# Patient Record
Sex: Female | Born: 1955
Health system: Southern US, Community
[De-identification: ages and names within clinical notes are randomized; demographics above are authoritative.]

## PROBLEM LIST (undated history)

## (undated) DIAGNOSIS — W19XXXA Unspecified fall, initial encounter: Secondary | ICD-10-CM

## (undated) DIAGNOSIS — M5136 Other intervertebral disc degeneration, lumbar region: Secondary | ICD-10-CM

## (undated) DIAGNOSIS — E119 Type 2 diabetes mellitus without complications: Secondary | ICD-10-CM

## (undated) DIAGNOSIS — C50919 Malignant neoplasm of unspecified site of unspecified female breast: Secondary | ICD-10-CM

## (undated) DIAGNOSIS — Z87440 Personal history of urinary (tract) infections: Secondary | ICD-10-CM

## (undated) DIAGNOSIS — C50412 Malignant neoplasm of upper-outer quadrant of left female breast: Secondary | ICD-10-CM

## (undated) DIAGNOSIS — Z86010 Personal history of colonic polyps: Secondary | ICD-10-CM

## (undated) DIAGNOSIS — Z8719 Personal history of other diseases of the digestive system: Secondary | ICD-10-CM

## (undated) DIAGNOSIS — R112 Nausea with vomiting, unspecified: Secondary | ICD-10-CM

## (undated) DIAGNOSIS — Z87442 Personal history of urinary calculi: Secondary | ICD-10-CM

## (undated) DIAGNOSIS — Z87411 Personal history of vaginal dysplasia: Secondary | ICD-10-CM

## (undated) DIAGNOSIS — M51369 Other intervertebral disc degeneration, lumbar region without mention of lumbar back pain or lower extremity pain: Secondary | ICD-10-CM

## (undated) DIAGNOSIS — M722 Plantar fascial fibromatosis: Secondary | ICD-10-CM

## (undated) DIAGNOSIS — Z794 Long term (current) use of insulin: Secondary | ICD-10-CM

## (undated) DIAGNOSIS — Z8639 Personal history of other endocrine, nutritional and metabolic disease: Secondary | ICD-10-CM

## (undated) DIAGNOSIS — E559 Vitamin D deficiency, unspecified: Secondary | ICD-10-CM

## (undated) DIAGNOSIS — J302 Other seasonal allergic rhinitis: Secondary | ICD-10-CM

## (undated) DIAGNOSIS — Z170 Estrogen receptor positive status [ER+]: Secondary | ICD-10-CM

## (undated) DIAGNOSIS — N200 Calculus of kidney: Secondary | ICD-10-CM

## (undated) DIAGNOSIS — D497 Neoplasm of unspecified behavior of endocrine glands and other parts of nervous system: Secondary | ICD-10-CM

## (undated) DIAGNOSIS — Z8709 Personal history of other diseases of the respiratory system: Secondary | ICD-10-CM

## (undated) DIAGNOSIS — C801 Malignant (primary) neoplasm, unspecified: Secondary | ICD-10-CM

## (undated) DIAGNOSIS — Z9889 Other specified postprocedural states: Secondary | ICD-10-CM

## (undated) DIAGNOSIS — R87610 Atypical squamous cells of undetermined significance on cytologic smear of cervix (ASC-US): Secondary | ICD-10-CM

## (undated) DIAGNOSIS — E213 Hyperparathyroidism, unspecified: Secondary | ICD-10-CM

## (undated) DIAGNOSIS — B379 Candidiasis, unspecified: Secondary | ICD-10-CM

## (undated) DIAGNOSIS — Z860101 Personal history of adenomatous and serrated colon polyps: Secondary | ICD-10-CM

## (undated) DIAGNOSIS — S0993XA Unspecified injury of face, initial encounter: Secondary | ICD-10-CM

## (undated) DIAGNOSIS — K5792 Diverticulitis of intestine, part unspecified, without perforation or abscess without bleeding: Secondary | ICD-10-CM

## (undated) DIAGNOSIS — K219 Gastro-esophageal reflux disease without esophagitis: Secondary | ICD-10-CM

## (undated) DIAGNOSIS — Z923 Personal history of irradiation: Secondary | ICD-10-CM

## (undated) DIAGNOSIS — E042 Nontoxic multinodular goiter: Secondary | ICD-10-CM

## (undated) DIAGNOSIS — N890 Mild vaginal dysplasia: Secondary | ICD-10-CM

## (undated) HISTORY — PX: COLON SURGERY: SHX602

## (undated) HISTORY — PX: DILATION AND CURETTAGE OF UTERUS: SHX78

## (undated) HISTORY — DX: Atypical squamous cells of undetermined significance on cytologic smear of cervix (ASC-US): R87.610

## (undated) HISTORY — DX: Neoplasm of unspecified behavior of endocrine glands and other parts of nervous system: D49.7

## (undated) HISTORY — DX: Diverticulitis of intestine, part unspecified, without perforation or abscess without bleeding: K57.92

## (undated) HISTORY — PX: RIGHT COLECTOMY: SHX853

## (undated) HISTORY — DX: Gastro-esophageal reflux disease without esophagitis: K21.9

## (undated) HISTORY — PX: APPENDECTOMY: SHX54

## (undated) HISTORY — DX: Vitamin D deficiency, unspecified: E55.9

## (undated) HISTORY — PX: LAPAROSCOPIC ASSISTED VAGINAL HYSTERECTOMY: SHX5398

## (undated) HISTORY — PX: LAPAROSCOPIC CHOLECYSTECTOMY: SUR755

## (undated) HISTORY — PX: HYSTEROSCOPY WITH RESECTOSCOPE: SHX5395

## (undated) HISTORY — DX: Plantar fascial fibromatosis: M72.2

## (undated) HISTORY — PX: ABDOMINAL HYSTERECTOMY: SHX81

## (undated) HISTORY — DX: Other seasonal allergic rhinitis: J30.2

## (undated) HISTORY — DX: Other intervertebral disc degeneration, lumbar region without mention of lumbar back pain or lower extremity pain: M51.369

## (undated) HISTORY — PX: CARPAL TUNNEL RELEASE: SHX101

## (undated) HISTORY — DX: Mild vaginal dysplasia: N89.0

## (undated) HISTORY — PX: TUBAL LIGATION: SHX77

## (undated) HISTORY — PX: OTHER SURGICAL HISTORY: SHX169

## (undated) HISTORY — DX: Personal history of colonic polyps: Z86.010

## (undated) HISTORY — DX: Other intervertebral disc degeneration, lumbar region: M51.36

## (undated) HISTORY — PX: CHOLECYSTECTOMY: SHX55

## (undated) HISTORY — DX: Calculus of kidney: N20.0

---
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
## 1998-06-15 ENCOUNTER — Other Ambulatory Visit: Admission: RE | Admit: 1998-06-15 | Discharge: 1998-06-15 | Payer: Self-pay | Admitting: Obstetrics and Gynecology

## 1998-08-06 ENCOUNTER — Other Ambulatory Visit: Admission: RE | Admit: 1998-08-06 | Discharge: 1998-08-06 | Payer: Self-pay | Admitting: Obstetrics and Gynecology

## 1999-06-02 ENCOUNTER — Encounter: Admission: RE | Admit: 1999-06-02 | Discharge: 1999-06-02 | Payer: Self-pay | Admitting: Obstetrics and Gynecology

## 1999-06-02 ENCOUNTER — Encounter: Payer: Self-pay | Admitting: Obstetrics and Gynecology

## 2000-06-02 ENCOUNTER — Encounter: Admission: RE | Admit: 2000-06-02 | Discharge: 2000-06-02 | Payer: Self-pay | Admitting: Obstetrics and Gynecology

## 2000-06-02 ENCOUNTER — Encounter: Payer: Self-pay | Admitting: Obstetrics and Gynecology

## 2000-07-05 ENCOUNTER — Other Ambulatory Visit: Admission: RE | Admit: 2000-07-05 | Discharge: 2000-07-05 | Payer: Self-pay | Admitting: Obstetrics and Gynecology

## 2000-07-07 ENCOUNTER — Encounter: Admission: RE | Admit: 2000-07-07 | Discharge: 2000-07-07 | Payer: Self-pay | Admitting: Obstetrics and Gynecology

## 2000-07-07 ENCOUNTER — Encounter: Payer: Self-pay | Admitting: Obstetrics and Gynecology

## 2000-07-25 ENCOUNTER — Other Ambulatory Visit: Admission: RE | Admit: 2000-07-25 | Discharge: 2000-07-25 | Payer: Self-pay | Admitting: Obstetrics and Gynecology

## 2001-01-11 HISTORY — PX: HYSTEROSCOPY: SHX211

## 2001-05-12 ENCOUNTER — Encounter: Payer: Self-pay | Admitting: Obstetrics and Gynecology

## 2001-05-12 ENCOUNTER — Encounter: Admission: RE | Admit: 2001-05-12 | Discharge: 2001-05-12 | Payer: Self-pay | Admitting: Obstetrics and Gynecology

## 2001-06-01 ENCOUNTER — Emergency Department (HOSPITAL_COMMUNITY): Admission: EM | Admit: 2001-06-01 | Discharge: 2001-06-01 | Payer: Self-pay | Admitting: Emergency Medicine

## 2001-06-01 ENCOUNTER — Encounter: Payer: Self-pay | Admitting: Emergency Medicine

## 2001-08-22 ENCOUNTER — Other Ambulatory Visit: Admission: RE | Admit: 2001-08-22 | Discharge: 2001-08-22 | Payer: Self-pay | Admitting: Obstetrics and Gynecology

## 2001-08-24 ENCOUNTER — Ambulatory Visit (HOSPITAL_COMMUNITY): Admission: RE | Admit: 2001-08-24 | Discharge: 2001-08-24 | Payer: Self-pay | Admitting: Obstetrics and Gynecology

## 2001-08-24 ENCOUNTER — Encounter (INDEPENDENT_AMBULATORY_CARE_PROVIDER_SITE_OTHER): Payer: Self-pay | Admitting: *Deleted

## 2002-01-11 HISTORY — PX: OTHER SURGICAL HISTORY: SHX169

## 2002-05-16 ENCOUNTER — Observation Stay (HOSPITAL_COMMUNITY): Admission: RE | Admit: 2002-05-16 | Discharge: 2002-05-17 | Payer: Self-pay | Admitting: Obstetrics and Gynecology

## 2002-05-16 ENCOUNTER — Encounter (INDEPENDENT_AMBULATORY_CARE_PROVIDER_SITE_OTHER): Payer: Self-pay | Admitting: *Deleted

## 2002-05-30 ENCOUNTER — Encounter: Payer: Self-pay | Admitting: Obstetrics and Gynecology

## 2002-05-30 ENCOUNTER — Encounter: Admission: RE | Admit: 2002-05-30 | Discharge: 2002-05-30 | Payer: Self-pay | Admitting: Obstetrics and Gynecology

## 2002-07-28 ENCOUNTER — Ambulatory Visit (HOSPITAL_COMMUNITY): Admission: RE | Admit: 2002-07-28 | Discharge: 2002-07-28 | Payer: Self-pay | Admitting: Neurology

## 2003-07-16 ENCOUNTER — Ambulatory Visit (HOSPITAL_BASED_OUTPATIENT_CLINIC_OR_DEPARTMENT_OTHER): Admission: RE | Admit: 2003-07-16 | Discharge: 2003-07-16 | Payer: Self-pay | Admitting: Orthopedic Surgery

## 2003-08-12 ENCOUNTER — Ambulatory Visit (HOSPITAL_BASED_OUTPATIENT_CLINIC_OR_DEPARTMENT_OTHER): Admission: RE | Admit: 2003-08-12 | Discharge: 2003-08-12 | Payer: Self-pay | Admitting: Orthopedic Surgery

## 2003-11-15 ENCOUNTER — Encounter: Admission: RE | Admit: 2003-11-15 | Discharge: 2003-12-30 | Payer: Self-pay | Admitting: Orthopedic Surgery

## 2004-08-26 ENCOUNTER — Ambulatory Visit (HOSPITAL_COMMUNITY): Admission: RE | Admit: 2004-08-26 | Discharge: 2004-08-26 | Payer: Self-pay | Admitting: Surgery

## 2004-09-18 ENCOUNTER — Encounter: Admission: RE | Admit: 2004-09-18 | Discharge: 2004-09-18 | Payer: Self-pay | Admitting: Surgery

## 2004-12-29 ENCOUNTER — Emergency Department (HOSPITAL_COMMUNITY): Admission: EM | Admit: 2004-12-29 | Discharge: 2004-12-30 | Payer: Self-pay | Admitting: Emergency Medicine

## 2005-02-12 ENCOUNTER — Ambulatory Visit (HOSPITAL_COMMUNITY): Admission: RE | Admit: 2005-02-12 | Discharge: 2005-02-12 | Payer: Self-pay | Admitting: Surgery

## 2005-07-16 ENCOUNTER — Ambulatory Visit (HOSPITAL_COMMUNITY): Admission: RE | Admit: 2005-07-16 | Discharge: 2005-07-16 | Payer: Self-pay | Admitting: *Deleted

## 2005-09-21 ENCOUNTER — Ambulatory Visit (HOSPITAL_COMMUNITY): Admission: RE | Admit: 2005-09-21 | Discharge: 2005-09-21 | Payer: Self-pay | Admitting: Surgery

## 2005-09-21 ENCOUNTER — Encounter (INDEPENDENT_AMBULATORY_CARE_PROVIDER_SITE_OTHER): Payer: Self-pay | Admitting: *Deleted

## 2007-03-29 ENCOUNTER — Ambulatory Visit (HOSPITAL_COMMUNITY): Admission: RE | Admit: 2007-03-29 | Discharge: 2007-03-29 | Payer: Self-pay | Admitting: *Deleted

## 2007-10-16 ENCOUNTER — Encounter: Admission: RE | Admit: 2007-10-16 | Discharge: 2008-01-14 | Payer: Self-pay | Admitting: *Deleted

## 2008-01-08 ENCOUNTER — Ambulatory Visit (HOSPITAL_COMMUNITY): Admission: RE | Admit: 2008-01-08 | Discharge: 2008-01-08 | Payer: Self-pay | Admitting: Surgery

## 2008-01-16 ENCOUNTER — Encounter: Admission: RE | Admit: 2008-01-16 | Discharge: 2008-01-16 | Payer: Self-pay | Admitting: Surgery

## 2008-04-04 ENCOUNTER — Emergency Department (HOSPITAL_COMMUNITY): Admission: EM | Admit: 2008-04-04 | Discharge: 2008-04-04 | Payer: Self-pay | Admitting: Family Medicine

## 2008-04-18 ENCOUNTER — Encounter: Admission: RE | Admit: 2008-04-18 | Discharge: 2008-04-18 | Payer: Self-pay | Admitting: Internal Medicine

## 2008-04-23 ENCOUNTER — Ambulatory Visit: Payer: Self-pay | Admitting: Family Medicine

## 2008-07-23 ENCOUNTER — Emergency Department (HOSPITAL_COMMUNITY): Admission: EM | Admit: 2008-07-23 | Discharge: 2008-07-23 | Payer: Self-pay | Admitting: Family Medicine

## 2008-12-16 ENCOUNTER — Ambulatory Visit (HOSPITAL_COMMUNITY): Admission: RE | Admit: 2008-12-16 | Discharge: 2008-12-16 | Payer: Self-pay | Admitting: Gastroenterology

## 2009-01-08 ENCOUNTER — Encounter: Admission: RE | Admit: 2009-01-08 | Discharge: 2009-01-08 | Payer: Self-pay | Admitting: *Deleted

## 2009-01-15 ENCOUNTER — Encounter: Admission: RE | Admit: 2009-01-15 | Discharge: 2009-01-15 | Payer: Self-pay | Admitting: *Deleted

## 2009-03-14 ENCOUNTER — Ambulatory Visit (HOSPITAL_COMMUNITY): Admission: RE | Admit: 2009-03-14 | Discharge: 2009-03-14 | Payer: Self-pay | Admitting: Gastroenterology

## 2009-03-31 ENCOUNTER — Encounter: Admission: RE | Admit: 2009-03-31 | Discharge: 2009-06-29 | Payer: Self-pay | Admitting: *Deleted

## 2009-07-01 ENCOUNTER — Encounter (INDEPENDENT_AMBULATORY_CARE_PROVIDER_SITE_OTHER): Payer: Self-pay | Admitting: Surgery

## 2009-07-01 ENCOUNTER — Inpatient Hospital Stay (HOSPITAL_COMMUNITY): Admission: RE | Admit: 2009-07-01 | Discharge: 2009-07-08 | Payer: Self-pay | Admitting: Surgery

## 2010-02-01 ENCOUNTER — Encounter: Payer: Self-pay | Admitting: Surgery

## 2010-02-10 NOTE — Assessment & Plan Note (Signed)
Summary: WGT MGT CLASS/[facility]

## 2010-03-29 LAB — DIFFERENTIAL
Basophils Absolute: 0.1 10*3/uL (ref 0.0–0.1)
Basophils Relative: 1 % (ref 0–1)
Eosinophils Absolute: 0.1 10*3/uL (ref 0.0–0.7)
Eosinophils Relative: 2 % (ref 0–5)
Lymphocytes Relative: 35 % (ref 12–46)
Lymphs Abs: 3.2 10*3/uL (ref 0.7–4.0)
Monocytes Absolute: 0.4 10*3/uL (ref 0.1–1.0)
Monocytes Relative: 5 % (ref 3–12)
Neutro Abs: 5.2 10*3/uL (ref 1.7–7.7)
Neutrophils Relative %: 58 % (ref 43–77)

## 2010-03-29 LAB — GLUCOSE, CAPILLARY
Glucose-Capillary: 116 mg/dL — ABNORMAL HIGH (ref 70–99)
Glucose-Capillary: 127 mg/dL — ABNORMAL HIGH (ref 70–99)
Glucose-Capillary: 128 mg/dL — ABNORMAL HIGH (ref 70–99)
Glucose-Capillary: 130 mg/dL — ABNORMAL HIGH (ref 70–99)
Glucose-Capillary: 136 mg/dL — ABNORMAL HIGH (ref 70–99)
Glucose-Capillary: 139 mg/dL — ABNORMAL HIGH (ref 70–99)
Glucose-Capillary: 141 mg/dL — ABNORMAL HIGH (ref 70–99)
Glucose-Capillary: 142 mg/dL — ABNORMAL HIGH (ref 70–99)
Glucose-Capillary: 143 mg/dL — ABNORMAL HIGH (ref 70–99)
Glucose-Capillary: 144 mg/dL — ABNORMAL HIGH (ref 70–99)
Glucose-Capillary: 144 mg/dL — ABNORMAL HIGH (ref 70–99)
Glucose-Capillary: 148 mg/dL — ABNORMAL HIGH (ref 70–99)
Glucose-Capillary: 148 mg/dL — ABNORMAL HIGH (ref 70–99)
Glucose-Capillary: 154 mg/dL — ABNORMAL HIGH (ref 70–99)
Glucose-Capillary: 154 mg/dL — ABNORMAL HIGH (ref 70–99)
Glucose-Capillary: 154 mg/dL — ABNORMAL HIGH (ref 70–99)
Glucose-Capillary: 155 mg/dL — ABNORMAL HIGH (ref 70–99)
Glucose-Capillary: 158 mg/dL — ABNORMAL HIGH (ref 70–99)
Glucose-Capillary: 167 mg/dL — ABNORMAL HIGH (ref 70–99)
Glucose-Capillary: 168 mg/dL — ABNORMAL HIGH (ref 70–99)
Glucose-Capillary: 171 mg/dL — ABNORMAL HIGH (ref 70–99)
Glucose-Capillary: 177 mg/dL — ABNORMAL HIGH (ref 70–99)
Glucose-Capillary: 180 mg/dL — ABNORMAL HIGH (ref 70–99)
Glucose-Capillary: 188 mg/dL — ABNORMAL HIGH (ref 70–99)
Glucose-Capillary: 191 mg/dL — ABNORMAL HIGH (ref 70–99)

## 2010-03-29 LAB — URINALYSIS, ROUTINE W REFLEX MICROSCOPIC
Bilirubin Urine: NEGATIVE
Glucose, UA: NEGATIVE mg/dL
Glucose, UA: NEGATIVE mg/dL
Hgb urine dipstick: NEGATIVE
Ketones, ur: NEGATIVE mg/dL
Ketones, ur: NEGATIVE mg/dL
Leukocytes, UA: NEGATIVE
Nitrite: NEGATIVE
Nitrite: POSITIVE — AB
Protein, ur: NEGATIVE mg/dL
Protein, ur: NEGATIVE mg/dL
Specific Gravity, Urine: 1.017 (ref 1.005–1.030)
Specific Gravity, Urine: 1.025 (ref 1.005–1.030)
Urobilinogen, UA: 0.2 mg/dL (ref 0.0–1.0)
Urobilinogen, UA: 1 mg/dL (ref 0.0–1.0)
pH: 5.5 (ref 5.0–8.0)
pH: 7 (ref 5.0–8.0)

## 2010-03-29 LAB — BASIC METABOLIC PANEL
BUN: 3 mg/dL — ABNORMAL LOW (ref 6–23)
BUN: 5 mg/dL — ABNORMAL LOW (ref 6–23)
CO2: 27 mEq/L (ref 19–32)
CO2: 29 mEq/L (ref 19–32)
Calcium: 10.5 mg/dL (ref 8.4–10.5)
Calcium: 9.5 mg/dL (ref 8.4–10.5)
Chloride: 101 mEq/L (ref 96–112)
Chloride: 96 mEq/L (ref 96–112)
Creatinine, Ser: 0.74 mg/dL (ref 0.4–1.2)
Creatinine, Ser: 0.78 mg/dL (ref 0.4–1.2)
GFR calc Af Amer: >60 mL/min (ref >60)
GFR calc Af Amer: >60 mL/min (ref >60)
GFR calc non Af Amer: >60 mL/min (ref >60)
GFR calc non Af Amer: >60 mL/min (ref >60)
Glucose, Bld: 139 mg/dL — ABNORMAL HIGH (ref 70–99)
Glucose, Bld: 160 mg/dL — ABNORMAL HIGH (ref 70–99)
Potassium: 4.1 mEq/L (ref 3.5–5.1)
Potassium: 4.4 mEq/L (ref 3.5–5.1)
Sodium: 133 mEq/L — ABNORMAL LOW (ref 135–145)
Sodium: 135 mEq/L (ref 135–145)

## 2010-03-29 LAB — COMPREHENSIVE METABOLIC PANEL
ALT: 60 U/L — ABNORMAL HIGH (ref 0–35)
AST: 48 U/L — ABNORMAL HIGH (ref 0–37)
Albumin: 4.1 g/dL (ref 3.5–5.2)
Alkaline Phosphatase: 115 U/L (ref 39–117)
BUN: 8 mg/dL (ref 6–23)
CO2: 27 mEq/L (ref 19–32)
Calcium: 10.7 mg/dL — ABNORMAL HIGH (ref 8.4–10.5)
Chloride: 104 mEq/L (ref 96–112)
Creatinine, Ser: 0.72 mg/dL (ref 0.4–1.2)
GFR calc Af Amer: >60 mL/min (ref >60)
GFR calc non Af Amer: >60 mL/min (ref >60)
Glucose, Bld: 104 mg/dL — ABNORMAL HIGH (ref 70–99)
Potassium: 4.4 mEq/L (ref 3.5–5.1)
Sodium: 136 mEq/L (ref 135–145)
Total Bilirubin: 0.9 mg/dL (ref 0.3–1.2)
Total Protein: 7 g/dL (ref 6.0–8.3)

## 2010-03-29 LAB — CBC
HCT: 38.8 % (ref 36.0–46.0)
HCT: 41.2 % (ref 36.0–46.0)
HCT: 42.7 % (ref 36.0–46.0)
Hemoglobin: 13.3 g/dL (ref 12.0–15.0)
Hemoglobin: 14.2 g/dL (ref 12.0–15.0)
Hemoglobin: 14.3 g/dL (ref 12.0–15.0)
MCH: 30.2 pg (ref 26.0–34.0)
MCHC: 33.5 g/dL (ref 30.0–36.0)
MCHC: 34.2 g/dL (ref 30.0–36.0)
MCHC: 34.5 g/dL (ref 30.0–36.0)
MCV: 89 fL (ref 78.0–100.0)
MCV: 90 fL (ref 78.0–100.0)
MCV: 90.2 fL (ref 78.0–100.0)
Platelets: 225 10*3/uL (ref 150–400)
Platelets: 248 10*3/uL (ref 150–400)
Platelets: 311 10*3/uL (ref 150–400)
RBC: 4.31 MIL/uL (ref 3.87–5.11)
RBC: 4.63 MIL/uL (ref 3.87–5.11)
RBC: 4.75 MIL/uL (ref 3.87–5.11)
RDW: 14.4 % (ref 11.5–15.5)
RDW: 14.5 % (ref 11.5–15.5)
RDW: 14.9 % (ref 11.5–15.5)
WBC: 10 10*3/uL (ref 4.0–10.5)
WBC: 11.2 10*3/uL — ABNORMAL HIGH (ref 4.0–10.5)
WBC: 9 10*3/uL (ref 4.0–10.5)

## 2010-03-29 LAB — URINE MICROSCOPIC-ADD ON

## 2010-03-29 LAB — URINE CULTURE: Colony Count: 60000

## 2010-03-29 LAB — HEPATIC FUNCTION PANEL
ALT: 56 U/L — ABNORMAL HIGH (ref 0–35)
AST: 35 U/L (ref 0–37)
Albumin: 4 g/dL (ref 3.5–5.2)
Alkaline Phosphatase: 116 U/L (ref 39–117)
Bilirubin, Direct: 0.2 mg/dL (ref 0.0–0.3)
Indirect Bilirubin: 1 mg/dL — ABNORMAL HIGH (ref 0.3–0.9)
Total Bilirubin: 1.2 mg/dL (ref 0.3–1.2)
Total Protein: 6.7 g/dL (ref 6.0–8.3)

## 2010-03-29 LAB — PROTIME-INR
INR: 0.95 (ref 0.00–1.49)
Prothrombin Time: 12.6 seconds (ref 11.6–15.2)

## 2010-04-06 LAB — GLUCOSE, CAPILLARY: Glucose-Capillary: 150 mg/dL — ABNORMAL HIGH (ref 70–99)

## 2010-04-19 LAB — POCT URINALYSIS DIP (DEVICE)
Bilirubin Urine: NEGATIVE
Glucose, UA: NEGATIVE mg/dL
Ketones, ur: NEGATIVE mg/dL
Nitrite: NEGATIVE
Protein, ur: NEGATIVE mg/dL
Specific Gravity, Urine: 1.025 (ref 1.005–1.030)
Urobilinogen, UA: 0.2 mg/dL (ref 0.0–1.0)
pH: 6 (ref 5.0–8.0)

## 2010-04-19 LAB — URINE CULTURE: Colony Count: >=100000

## 2010-04-23 LAB — POCT URINALYSIS DIP (DEVICE)
Bilirubin Urine: NEGATIVE
Glucose, UA: NEGATIVE mg/dL
Hgb urine dipstick: NEGATIVE
Ketones, ur: NEGATIVE mg/dL
Nitrite: NEGATIVE
Protein, ur: NEGATIVE mg/dL
Specific Gravity, Urine: 1.025 (ref 1.005–1.030)
Urobilinogen, UA: 0.2 mg/dL (ref 0.0–1.0)
pH: 6 (ref 5.0–8.0)

## 2010-05-29 NOTE — Op Note (Signed)
[name], [name]          ACCOUNT NO.:  [id number]   MEDICAL RECORD NO.:  [id number]          PATIENT TYPE:  OIB   LOCATION:  5727                         FACILITY:  [facility]   PHYSICIAN:  Velora Heckler, MD      DATE OF BIRTH:  1955/06/28   DATE OF PROCEDURE:  09/21/2005  DATE OF DISCHARGE:                                 OPERATIVE REPORT   PREOPERATIVE DIAGNOSIS:  Symptomatic cholelithiasis.   POSTOPERATIVE DIAGNOSIS:  Symptomatic cholelithiasis.   PROCEDURE:  Laparoscopic cholecystectomy.   SURGEON:  Velora Heckler, MD, FACS   ASSISTANT:  Gita Kudo, M.D., FACS   ANESTHESIA:  General.   ESTIMATED BLOOD LOSS:  Minimal.   PREPARATION:  Betadine.   COMPLICATIONS:  None.   INDICATIONS:  The patient is a 55 year old white female employee of [employer] who presents with several month history of right upper  quadrant abdominal pain.  The patient has noted fatty food intolerance.  Pain occasionally radiates to the back.  An ultrasound performed at [facility] documented gallstones and fatty liver.  The patient now comes to  surgery for treatment of symptomatic cholelithiasis.   DESCRIPTION OF PROCEDURE:  Procedure is done in OR #16 at the [facility].  The patient is brought to the operating room, placed in  supine position on the operating room table.  Following administration of  general anesthesia, the patient is prepped and draped in the usual strict  aseptic fashion.  After ascertaining that an adequate level of anesthesia  had been obtained, a infraumbilical incision is made through the old scar at  that site.  Dissection is carried through subcutaneous tissues to the  fascia.  Fascia is incised in the midline.  The peritoneal cavity is entered  cautiously.  A 0 Vicryl pursestring suture is placed in the fascia.  An  Hasson cannula is introduced and secured with a pursestring suture.  Abdomen  is insufflated with  carbon dioxide.  Laparoscope is introduced and the  abdomen explored.  Operative ports are placed along the right costal margin  in the midline, mid clavicular line and anterior axillary line.  Fundus of  the gallbladder is grasped and retracted cephalad.  There is significant  adhesions of the omentum to the undersurface of the gallbladder.  These are  taken down with gentle blunt dissection.  Dissection is carried down to the  neck of the gallbladder.  Cystic duct is dissected out.  It is quite small.  The stones are moderately large.  Decision is made not to proceed with  cholangiography and the cystic duct is triply clipped and divided.  Cystic  artery and its posterior branches are also divided between Ligaclips.  Gallbladder is excised from the gallbladder bed using the hook  electrocautery for hemostasis.  Gallbladder is completely excised and placed  into an EndoCatch bag.  It is withdrawn through the umbilical port.  It  contains numerous large gallstones.  The 0 Vicryl pursestring suture is tied  securely.  Right upper quadrant is irrigated copiously with warm saline  which is  evacuated.  Good hemostasis is noted in the gallbladder bed.  Pneumoperitoneum is released.  Ports are removed under direct vision.  Good  hemostasis is noted at all port sites.  Port sites are anesthetized with  local anesthetic.  All wounds are closed with interrupted 4-0 Vicryl  subcuticular sutures.  Wounds are washed and dried and Benzoin and Steri-  Strips are applied.  Sterile dressings are applied.  The patient is awakened  from anesthesia and brought to the recovery room in stable condition.  The  patient tolerated the procedure well.      Velora Heckler, MD  Electronically Signed     TMG/MEDQ  D:  09/21/2005  T:  09/21/2005  Job:  [id number]   cc:   Samuel Jester

## 2010-05-29 NOTE — Op Note (Signed)
NAME:  Alexis [name], Alexis [name]                    ACCOUNT NO.:  [id number]   MEDICAL RECORD NO.:  [id number]                   PATIENT TYPE:  AMB   LOCATION:  DSC                                  FACILITY:  [facility]   PHYSICIAN:  Robert A. Thurston Hole, M.D.              DATE OF BIRTH:  07/14/1955   DATE OF PROCEDURE:  08/12/2003  DATE OF DISCHARGE:                                 OPERATIVE REPORT   PREOPERATIVE DIAGNOSIS:  Left carpal tunnel syndrome.   POSTOPERATIVE DIAGNOSIS:  Left carpal tunnel syndrome.   PROCEDURE:  Left carpal tunnel release.   SURGEON:  Elana Alm. Thurston Hole, M.D.   ASSISTANT:  Julien Girt, P.A.   ANESTHESIA:  General anesthesia.   OPERATIVE TIME:  15 minutes.   COMPLICATIONS:  None.   INDICATIONS FOR PROCEDURE:  [name] is a 55 year old woman who has had  significant left carpal tunnel syndrome increasing in nature with examine  and nerve conduction studies documenting this.  She has failed conservative  care and is now to undergo release.   DESCRIPTION OF PROCEDURE:  [name] is brought to the operating room on  August 12, 2003, placed on the operating table in supine position.  After an  adequate level of general anesthesia was obtained, her left hand and arm  were prepped using sterile DuraPrep and draped using sterile technique.  Arm  was exsanguinated and a tourniquet elevated 275 mmHg.  Initially through a 4  cm palmar incision, initial exposure was made.  The underlying subcutaneous  tissues were incised along with skin incision.  The transverse carpal  ligament was exposed at the level of the wrist flexion crease and carefully  incised.  Hemostat was used to protect the median nerve while the entire  transverse carpal ligament was released distally to the level of the  superficial palmar arch, carefully protecting this and released proximally  approximately three inches proximal to the wrist flexion crease, carefully  protecting the palmar  cutaneous branch of the median nerve.  The median  nerve itself was found to be significantly flattened and compressed but no  other pathology was noted.  After this was done, it was felt a complete  satisfactory release had been achieved.  The wound was then irrigated and  closed with interrupted 4-0 nylon mattress sutures and injected with 0.25%  Marcaine. Sterile dressing was applied.  Tourniquet was released and the  patient was awakened and taken to the recovery room in stable condition.   FOLLOW UP CARE:  [name] will be followed as an outpatient on Vicodin  and Naprosyn.  See her back in the office in a week for wound check and  follow-up.  Robert A. Thurston Hole, M.D.   RAW/MEDQ  D:  08/12/2003  T:  08/12/2003  Job:  [id number]

## 2010-05-29 NOTE — Op Note (Signed)
NAME:  Alexis [name], Alexis [name]                       ACCOUNT NO.:  [id number]   MEDICAL RECORD NO.:  [id number]                   PATIENT TYPE:  OBV   LOCATION:  9306                                 FACILITY:  [facility]   PHYSICIAN:  Sherry A. Rosalio Macadamia, M.D.           DATE OF BIRTH:  11-29-1955   DATE OF PROCEDURE:  05/16/2002  DATE OF DISCHARGE:  05/17/2002                                 OPERATIVE REPORT   PREOPERATIVE DIAGNOSES:  1. Menorrhagia.  2. Fibroid uterus.   POSTOPERATIVE DIAGNOSES:  1. Menorrhagia.  2. Fibroid uterus.   PROCEDURE:  Laparoscopically-assisted vaginal hysterectomy.   SURGEONS:  Sherry A. Rosalio Macadamia, M.D., Genia Del, M.D.   ANESTHESIA:  General.   INDICATIONS:  This is a 55 year old gravida 2, para 2-0-0-2, woman who has  had a known fibroid uterus for many years.  The patient has had a long  history of menorrhagia.  She was initially treated in August 2003 with a D&C  hysteroscopy with resectoscope to remove an endometrial polyp.  Endometrial  polyp was benign.  The patient initially had improvement in her cycles;  however, now the bleeding has become excessively heavy once again.  Repeat  ultrasound revealed enlarged uterus with several fibroids but no thickened  endometrium at this time.  Because of this the patient is brought to the  operating room for LAVH.   FINDINGS:  A 10 to 11-week size uterus, normal fallopian tubes and ovaries,  normal appendix and upper abdomen.   PROCEDURE:  The patient was brought into the operating room and given  adequate general anesthesia.  She was placed in a dorsal lithotomy position.  Her abdomen and vagina were washed with Hibiclens.  A Foley catheter was  placed in the bladder.  A speculum was placed within the vagina.  The cervix  was grasped with a single-toothed Hulka tenaculum.  The speculum was  removed.  The surgeon's gown and gloves were changed.  The subumbilical area  was infiltrated with 0.25%  Marcaine.  Incision was made.  This was brought  down to the fascia.  The fascia was grasped with Kocher clamps and incised.  The fascial edges were grasped and sutured in a pursestring-type stitch with  0 Vicryl.  The peritoneum was identified and entered bluntly.  The Hasson  trocar was introduced into the peritoneal cavity.  The laparoscope was  introduced and carbon dioxide was used for insufflation.  Using 0.25%  Marcaine, lateral incisions were made and 5 mm trocars placed on each side  under direct visualization.  The pelvis was inspected with the above  findings, and it was felt that an LAVH could be performed.  Using tripolar  cautery, the right round ligament was cauterized x3 and cut in the middle.  This technique was used to interrupted the utero-ovarian ligaments.  This  was brought down to just above the uterine artery.  The right and left  ureters had  been identified prior to any surgery.  The same procedure was  performed on the left.  The bladder peritoneum was identified and incised.  Nezhat was used for hydro-insufflation.  The bladder peritoneum was then  incised bilaterally.  Small amount of cautery was continued close to the  uterus.  Then the vaginal portion of the procedure was performed.   A weighted speculum was placed within the vagina, a Jacobson tenaculum was  placed on the cervix, an the Hulka tenaculum was removed.  The cervix was  infiltrated with 1% Xylocaine with epinephrine circumferentially.  Incision  was made circumferentially.  The posterior cul-de-sac was entered sharply.  This incision was extended.  The uterosacral ligaments were clamped, cut,  and suture ligated with 0 Vicryl LigaSure.  The vaginal cuff was then closed  with 0 Vicryl in a running locked stitch.  A long-nosed weighted speculum  was placed within the posterior cul-de-sac.  The vaginal mucosa was  developed off of the cervix with blunt and sharp dissection.  The anterior  cul-de-sac  was then entered and retractor placed within the space.  On  alternating sides, LigaSure was used for cautery x2 or 3 prior to cutting.  This was performed up to the area of abdominal cautery at the utero-ovarian  ligament.  Once all vascular pedicles had been cauterized and cut, the  cervix and uterus were removed from the vagina.  Small bleeders were  identified along the cardinal ligaments, and these were closed with 0 Vicryl  figure-of-eight stitches and small areas were cauterized.  McCall plication  stitches were taken across the posterior cul-de-sac to try to prevent future  enterocele development.  The anterior peritoneum was identified and the  vagina was closed in a vertical fashion using 0 Vicryl figure-of-eight  stitches.  The uterosacral ligaments were tied in the midline.  Adequate  hemostasis was present.  Surgeons' gloves were changed and the laparoscope  was reintroduced into the abdominal cavity and reinsufflated.  The pelvis  was inspected with no significant bleeding present.  The pelvis was  irrigated.  The upper abdomen was not visualized.  The appendix was  visualized and felt to be normal.  All irrigation was removed and all carbon  dioxide was removed.  The left lateral sleeve was removed under direct  visualization.  The Hasson was removed under direct visualization, and the  right sleeve was removed after all carbon dioxide had escaped.  The  subumbilical incision was closed after carefully assuring no tissues coming  through the incision, and this was closed with the 0 Vicryl pursestring that  was in place.  The subumbilical incision was then closed using 4-0 Monocryl  in a subcuticular running stitch.  All three incisions were then closed with  Dermabond.  The patient was taken out of the dorsal lithotomy position.  She  was awakened.  She was moved from the operating table to a stretcher in  stable condition.  COMPLICATIONS:  None.   ESTIMATED BLOOD LOSS:   200 mL.   URINE OUTPUT:  350 mL.                                                Sherry A. Rosalio Macadamia, M.D.    SAD/MEDQ  D:  07/01/2002  T:  07/02/2002  Job:  [id number]

## 2010-05-29 NOTE — Op Note (Signed)
NAME:  Alexis [name], Alexis [name]                       ACCOUNT NO.:  [id number]   MEDICAL RECORD NO.:  [id number]                   PATIENT TYPE:  AMB   LOCATION:  SDC                                  FACILITY:  [facility]   PHYSICIAN:  Sherry A. Rosalio Macadamia, M.D.           DATE OF BIRTH:  23-Mar-1955   DATE OF PROCEDURE:  08/24/2001  DATE OF DISCHARGE:                                 OPERATIVE REPORT   PREOPERATIVE DIAGNOSES:  1. Irregular bleeding.  2. Fibroid uterus.  3. Thickened endometrium.   POSTOPERATIVE DIAGNOSES:  1. Irregular bleeding.  2. Fibroid uterus.  3. Thickened endometrium.  4. Endometrial polyps.   PROCEDURE:  Dilatation and curettage, hysteroscopy with resectoscope.   SURGEON:  Sherry A. Rosalio Macadamia, M.D.   ANESTHESIA:  MAC converted to general.   INDICATIONS:  This is a 55 year old woman who has had very irregular and  heavy periods.  Some periods have been every two to three weeks.  Besides  having irregular cycles, she is also having bleeding between cycles and  menses lasting longer than normal.  Because of this, the patient has had an  ultrasound which revealed several fibroids. She also had thickened  endometrium with probably polyp present.  Because of this, the patient is  brought to the operating room for [facility] hysteroscopy.   FINDINGS:  Ten week size anteflexed uterus with endometrial polyp.  No  adnexal mass.   DESCRIPTION OF PROCEDURE:  The patient is brought to the operating room and  given adequate IV sedation.  She was placed in a dorsal lithotomy position.  Her perineum was washed with Hibiclens.  Pelvic examination was performed.  The patient was draped in a sterile fashion.  Speculum was placed in the  vagina.  The vagina was washed with Hibiclens.  A paracervical block was  administered with 1% Nesacaine.  The cervix was sounded.  The cervix was  dilated with Pratt dilators to a #31.  The hysteroscope was introduced into  the endometrial cavity.   During this procedure, the patient was moving  significantly, and it was felt that she should receive general anesthesia at  this time.  This was done, and she tolerated the remaining portion of the  surgery.  Pictures were obtained.  Using double loop right angle resector,  the polypoid structure at the internal os was removed first, then polypoid  structure in the endometrial cavity was removed.  Sampling of the  endometrial cavity was then obtained circumferentially with superficial  sheets of endometrial tissue removed.  Bleeders were cauterized.  Pictures  were  obtained.  Adequate hemostasis was present.  All instruments were removed  from the vagina.  The patient was taken out of the dorsal lithotomy  position.  She was awakened.  She was moved from the operating room to the  stretcher in stable condition.  Complications were none.  Estimated blood  loss was less than 5 cc.  Sherry A. Rosalio Macadamia, M.D.    SAD/MEDQ  D:  08/24/2001  T:  08/25/2001  Job:  [id number]

## 2010-05-29 NOTE — Op Note (Signed)
NAME:  Alexis [name], Alexis [name]                    ACCOUNT NO.:  [id number]   MEDICAL RECORD NO.:  [id number]                   PATIENT TYPE:  AMB   LOCATION:  DSC                                  FACILITY:  [facility]   PHYSICIAN:  Robert A. Thurston Hole, M.D.              DATE OF BIRTH:  04/10/1955   DATE OF PROCEDURE:  DATE OF DISCHARGE:                                 OPERATIVE REPORT   DATE OF OPERATION:  July 16, 2003.   PREOPERATIVE DIAGNOSIS:  Right carpal tunnel syndrome.   POSTOPERATIVE DIAGNOSIS:  Right carpal tunnel syndrome.   PROCEDURE:  Right carpal tunnel release.   SURGEON:  Elana Alm. Thurston Hole, MD   ASSISTANT:  Julien Girt, PA.   ANESTHESIA:  General.   OPERATIVE TIME:  20 minutes.   COMPLICATIONS:  None.   INDICATIONS FOR PROCEDURE:  [name] is a 55 year old woman who has had  painful, recurrent right carpal tunnel syndrome documented by EMG-PNCV  evaluation, that has failed conservative and is now to undergo carpal tunnel  release.   DESCRIPTION OF PROCEDURE:  [name] was brought to the operating room  on July 16, 2003, placed on the operating room table in the supine position.  After an adequate level of general anesthesia was obtained, her right hand  and arm were prepped using sterile Duraprep and draped using a sterile  technique.  The arm was exsanguinated and a tourniquet elevated to 275 mm.  Initially through a 4 cm palmar incision, initial exposure was made.  The  underlying subcutaneous tissues were incised in line with the skin incision.  The transverse carpal ligament was exposed at the level of the wrist flexion  crease and carefully incised revealing the underlying median nerve which was  carefully protected with a hemostat while the entire transverse carpal  ligament was released distally to the level of the superficial palmar arch,  carefully protecting this and released proximally approximately 3 inches  proximal to the wrist flexion  crease, carefully protecting the palmar  cutaneous branch of the median nerve.  The median nerve itself was found to  be significantly flattened and compressed, but no other pathology was noted.  A full and complete release was achieved.  The wound was then irrigated and  closed with interrupted 4-0 nylon suture and injected with 0.25% Marcaine.  Sterile dressings were applied, the tourniquet was released, and the patient  was awakened and taken to the recovery room in stable condition.   FOLLOWUP CARE:  [name] is to be followed as an outpatient on Vicodin  and Naprosyn.  See her back in the office in a week for wound check and  followup.  Robert A. Thurston Hole, M.D.    RAW/MEDQ  D:  07/16/2003  T:  07/16/2003  Job:  [id number]

## 2010-12-07 ENCOUNTER — Other Ambulatory Visit (HOSPITAL_COMMUNITY): Payer: Self-pay | Admitting: *Deleted

## 2010-12-07 DIAGNOSIS — Z1231 Encounter for screening mammogram for malignant neoplasm of breast: Secondary | ICD-10-CM

## 2011-01-14 ENCOUNTER — Ambulatory Visit (HOSPITAL_COMMUNITY)
Admission: RE | Admit: 2011-01-14 | Discharge: 2011-01-14 | Disposition: A | Discharge status: Home / Self Care | Payer: 59 | Source: Ambulatory Visit | Attending: *Deleted | Admitting: *Deleted

## 2011-01-14 DIAGNOSIS — Z1231 Encounter for screening mammogram for malignant neoplasm of breast: Secondary | ICD-10-CM

## 2011-05-17 ENCOUNTER — Encounter: Payer: 59 | Admitting: Dietician

## 2011-06-16 ENCOUNTER — Encounter: Payer: Self-pay | Admitting: *Deleted

## 2011-06-16 ENCOUNTER — Encounter: Payer: 59 | Attending: *Deleted | Admitting: *Deleted

## 2011-06-16 DIAGNOSIS — E119 Type 2 diabetes mellitus without complications: Secondary | ICD-10-CM | POA: Insufficient documentation

## 2011-06-16 DIAGNOSIS — Z713 Dietary counseling and surveillance: Secondary | ICD-10-CM | POA: Insufficient documentation

## 2011-06-16 NOTE — Progress Notes (Signed)
  Medical Nutrition Therapy:  Appt start time: 1700 end time:  1830.  Assessment:  Primary concerns today: patient here for assistance with diabetes education and weight loss. She is here with her close friend and co-worker who also has diabetes and is interested in weight loss. She works 40 hours a week and lives with her daughter, 2 grandchildren and her daughter's boyfriend. She prepares the meals for the family. She has attended Diabetes Education Classes in the past.  MEDICATIONS: see list. Diabetes medication is Metformin   DIETARY INTAKE:  Usual eating pattern includes 3 meals and 0-2 snacks per day.  Everyday foods include good variety of most food groups.  Avoided foods include milk and diet soda.    24-hr recall:  B ( AM): eggs, bacon at work, OR cereal and milk OR breakfast sandwich  Snk ( AM): occasionally if hungry Nabs OR Nutrigrain Bar OR fresh fruit  L ( PM): brings from home; left overs, water OR small regular soda Snk ( PM): very occasionally same as AM D ( PM): meat, starch, vegetables OR left overs Snk ( PM): none Beverages: water, small amount of regular soda occasionally,  Usual physical activity: clean house, mow yard, walk occasionally, play with grandchildren  Estimated energy needs: 1200 calories 135 g carbohydrates 90 g protein 33 g fat  Progress Towards Goal(s):  In progress.   Nutritional Diagnosis:  NI-1.5 Excessive energy intake As related to limited activity level.  As evidenced by BMI of 34%.    Intervention:  Nutrition counseling and diabetes education review provided. Reviewed physiology of diabetes, action of Metformin, Carb Counting and importance of increasing her activity level to increase her metabolism. Plan: Plan for 15 minutes of activity every day Aim for 2-3 Carb Choices (30-45 grams) per meal, 0-1 per snack if hungry Continue to check BG as directed by your MD Consider using Menu Planner to practice meal planning Remember Calorie  Brooke Dare App for phone Plan to discuss Meal Plan ideas at next visit and Glycemic Index  Handouts given during visit include: Living Well with Diabetes Carb Counting and Food Label handouts Meal Plan Card Menu Planner  Get Fit While You Sit Exercise Book handout  Monitoring/Evaluation:  Dietary intake, exercise, and body weight prn.

## 2011-06-16 NOTE — Patient Instructions (Signed)
Plan: Plan for 15 minutes of activity every day Aim for 2-3 Carb Choices (30-45 grams) per meal, 0-1 per snack if hungry Continue to check BG as directed by your MD Consider using Menu Planner to practice meal planning Remember Calorie Brooke Dare App for phone Plan to discuss Meal Plan ideas at next visit and Glycemic Index

## 2011-11-29 ENCOUNTER — Emergency Department (HOSPITAL_COMMUNITY)
Admission: EM | Admit: 2011-11-29 | Discharge: 2011-11-29 | Disposition: A | Discharge status: Home / Self Care | Payer: 59 | Attending: Emergency Medicine | Admitting: Emergency Medicine

## 2011-11-29 ENCOUNTER — Encounter (HOSPITAL_COMMUNITY): Payer: Self-pay | Admitting: *Deleted

## 2011-11-29 ENCOUNTER — Emergency Department (HOSPITAL_COMMUNITY): Payer: 59

## 2011-11-29 DIAGNOSIS — K529 Noninfective gastroenteritis and colitis, unspecified: Secondary | ICD-10-CM

## 2011-11-29 DIAGNOSIS — K5289 Other specified noninfective gastroenteritis and colitis: Secondary | ICD-10-CM | POA: Insufficient documentation

## 2011-11-29 DIAGNOSIS — R197 Diarrhea, unspecified: Secondary | ICD-10-CM | POA: Insufficient documentation

## 2011-11-29 DIAGNOSIS — E119 Type 2 diabetes mellitus without complications: Secondary | ICD-10-CM | POA: Insufficient documentation

## 2011-11-29 DIAGNOSIS — Z79899 Other long term (current) drug therapy: Secondary | ICD-10-CM | POA: Insufficient documentation

## 2011-11-29 DIAGNOSIS — Z7982 Long term (current) use of aspirin: Secondary | ICD-10-CM | POA: Insufficient documentation

## 2011-11-29 HISTORY — DX: Type 2 diabetes mellitus without complications: E11.9

## 2011-11-29 LAB — CBC WITH DIFFERENTIAL/PLATELET
Basophils Absolute: 0 10*3/uL (ref 0.0–0.1)
Basophils Relative: 0 % (ref 0–1)
Eosinophils Absolute: 0.2 10*3/uL (ref 0.0–0.7)
Eosinophils Relative: 3 % (ref 0–5)
HCT: 44.3 % (ref 36.0–46.0)
Hemoglobin: 14.8 g/dL (ref 12.0–15.0)
Lymphocytes Relative: 32 % (ref 12–46)
Lymphs Abs: 2.7 10*3/uL (ref 0.7–4.0)
MCH: 29.3 pg (ref 26.0–34.0)
MCHC: 33.4 g/dL (ref 30.0–36.0)
MCV: 87.7 fL (ref 78.0–100.0)
Monocytes Absolute: 0.4 10*3/uL (ref 0.1–1.0)
Monocytes Relative: 5 % (ref 3–12)
Neutro Abs: 5 10*3/uL (ref 1.7–7.7)
Neutrophils Relative %: 60 % (ref 43–77)
Platelets: 233 10*3/uL (ref 150–400)
RBC: 5.05 MIL/uL (ref 3.87–5.11)
RDW: 13.9 % (ref 11.5–15.5)
WBC: 8.3 10*3/uL (ref 4.0–10.5)

## 2011-11-29 LAB — COMPREHENSIVE METABOLIC PANEL
ALT: 41 U/L — ABNORMAL HIGH (ref 0–35)
AST: 26 U/L (ref 0–37)
Albumin: 4.3 g/dL (ref 3.5–5.2)
Alkaline Phosphatase: 113 U/L (ref 39–117)
BUN: 9 mg/dL (ref 6–23)
CO2: 24 mEq/L (ref 19–32)
Calcium: 11.3 mg/dL — ABNORMAL HIGH (ref 8.4–10.5)
Chloride: 107 mEq/L (ref 96–112)
Creatinine, Ser: 0.63 mg/dL (ref 0.50–1.10)
GFR calc Af Amer: >90 mL/min (ref >90)
GFR calc non Af Amer: >90 mL/min (ref >90)
Glucose, Bld: 119 mg/dL — ABNORMAL HIGH (ref 70–99)
Potassium: 3.8 mEq/L (ref 3.5–5.1)
Sodium: 142 mEq/L (ref 135–145)
Total Bilirubin: 0.7 mg/dL (ref 0.3–1.2)
Total Protein: 7.4 g/dL (ref 6.0–8.3)

## 2011-11-29 LAB — LIPASE, BLOOD: Lipase: 20 U/L (ref 11–59)

## 2011-11-29 MED ORDER — MORPHINE SULFATE 2 MG/ML IJ SOLN
INTRAMUSCULAR | Status: AC
  Administered 2011-11-29: 2 mg via INTRAVENOUS
  Filled 2011-11-29: qty 1

## 2011-11-29 MED ORDER — KETOROLAC TROMETHAMINE 30 MG/ML IJ SOLN
30.0000 mg | Freq: Once | INTRAMUSCULAR | Status: AC
  Administered 2011-11-29: 30 mg via INTRAVENOUS

## 2011-11-29 MED ORDER — IOHEXOL 300 MG/ML  SOLN
100.0000 mL | Freq: Once | INTRAMUSCULAR | Status: AC | PRN
  Administered 2011-11-29: 100 mL via INTRAVENOUS

## 2011-11-29 MED ORDER — ONDANSETRON HCL 4 MG PO TABS: 4.0000 mg | ORAL_TABLET | Freq: Four times a day (QID) | ORAL | Status: DC

## 2011-11-29 MED ORDER — MORPHINE SULFATE 2 MG/ML IJ SOLN
2.0000 mg | Freq: Once | INTRAMUSCULAR | Status: AC
  Administered 2011-11-29: 2 mg via INTRAVENOUS

## 2011-11-29 MED ORDER — KETOROLAC TROMETHAMINE 30 MG/ML IJ SOLN
INTRAMUSCULAR | Status: AC
  Administered 2011-11-29: 30 mg via INTRAVENOUS
  Filled 2011-11-29: qty 1

## 2011-11-29 MED ORDER — SODIUM CHLORIDE 0.9 % IV BOLUS (SEPSIS)
1000.0000 mL | Freq: Once | INTRAVENOUS | Status: AC
  Administered 2011-11-29: 1000 mL via INTRAVENOUS

## 2011-11-29 MED ORDER — ONDANSETRON HCL 4 MG/2ML IJ SOLN
4.0000 mg | Freq: Once | INTRAMUSCULAR | Status: AC
  Administered 2011-11-29: 4 mg via INTRAVENOUS
  Filled 2011-11-29: qty 2

## 2011-11-29 MED ORDER — DICYCLOMINE HCL 20 MG PO TABS: ORAL_TABLET | ORAL | Status: DC

## 2011-11-29 NOTE — ED Notes (Signed)
Pt with N/V/D since last Tuesday, c/o bloating of abdomen, lost 5 lbs this week per pt.

## 2011-11-29 NOTE — ED Notes (Signed)
Patient with no complaints at this time. Respirations even and unlabored. Skin warm/dry. Discharge instructions reviewed with patient at this time. Patient given opportunity to voice concerns/ask questions. IV removed per policy and band-aid applied to site. Patient discharged at this time and left Emergency Department with steady gait.  

## 2011-11-29 NOTE — ED Notes (Signed)
Family at bedside. Patient does not need anything at this time. 

## 2011-11-29 NOTE — ED Provider Notes (Signed)
History    This chart was scribed for Alexis Lennert, MD, MD by Smitty Pluck, ED Scribe. The patient was seen in room [facility]06 and the patient's care was started at 3:26PM.   CSN: [id number]  Arrival date & time 11/29/11  1424     Chief Complaint  Patient presents with  . Emesis  . Diarrhea  . Nausea    (Consider location/radiation/quality/duration/timing/severity/associated sxs/prior treatment) Patient is a 56 y.o. female presenting with vomiting and diarrhea. The history is provided by the patient. No language interpreter was used.  Emesis  This is a new problem. The current episode started yesterday. The problem occurs 2 to 4 times per day. The problem has been resolved. The emesis has an appearance of stomach contents. There has been no fever. Pertinent negatives include no abdominal pain, no cough, no diarrhea and no headaches.  Diarrhea The primary symptoms include vomiting. Primary symptoms do not include fatigue, abdominal pain, diarrhea or rash. The illness began 6 to 7 days ago. The onset was sudden.  The illness does not include back pain.   Alexis [name] is a 56 y.o. female who presents to the Emergency Department complaining of intermittent, moderate diarrhea onset 6 days ago. Describes the diarrhea as watery. Pt reports that she has had 12 inches of colon removed, gallbladder removed and hysterectomy. She reports vomiting 4x 1 day ago. Denies abdominal pain, rectal bleeding, blood in stool and any other pain.   Past Medical History  Diagnosis Date  . Diabetes mellitus without complication     Past Surgical History  Procedure Date  . Colon surgery   . Cholecystectomy   . Abdominal hysterectomy   . Carpal tunnel release     No family history on file.  History  Substance Use Topics  . Smoking status: Never Smoker   . Smokeless tobacco: Never Used  . Alcohol Use: No    OB History    Grav Para Term Preterm Abortions TAB SAB Ect Mult Living        Review of Systems  Constitutional: Negative for fatigue.  HENT: Negative for congestion, sinus pressure and ear discharge.   Eyes: Negative for discharge.  Respiratory: Negative for cough.   Cardiovascular: Negative for chest pain.  Gastrointestinal: Positive for vomiting. Negative for abdominal pain and diarrhea.  Genitourinary: Negative for frequency and hematuria.  Musculoskeletal: Negative for back pain.  Skin: Negative for rash.  Neurological: Negative for seizures and headaches.  Hematological: Negative.   Psychiatric/Behavioral: Negative for hallucinations.  All other systems reviewed and are negative.    Allergies  Parlodel  Home Medications   Current Outpatient Rx  Name  Route  Sig  Dispense  Refill  . ASPIRIN 81 MG PO TABS   Oral   Take 81 mg by mouth daily.         Marland Kitchen ESOMEPRAZOLE MAGNESIUM 40 MG PO CPDR   Oral   Take 40 mg by mouth daily before breakfast.         . LEVOTHYROXINE SODIUM 25 MCG PO TABS   Oral   Take 25 mcg by mouth daily.         Marland Kitchen METFORMIN HCL 500 MG PO TABS   Oral   Take 500 mg by mouth 2 (two) times daily with a meal.         . PHENTERMINE HCL 37.5 MG PO CAPS   Oral   Take 37.5 mg by mouth every morning.         Marland Kitchen  RAMIPRIL 5 MG PO CAPS   Oral   Take 5 mg by mouth daily.         Marland Kitchen VITAMIN D (ERGOCALCIFEROL) 50000 UNITS PO CAPS   Oral   Take 50,000 Units by mouth.           BP 148/73  Pulse 89  Temp 98.3 F (36.8 C) (Oral)  Resp 20  Ht 5' 4.5" (1.638 m)  Wt 195 lb (88.451 kg)  BMI 32.95 kg/m2  SpO2 96%  Physical Exam  Nursing note and vitals reviewed. Constitutional: She is oriented to person, place, and time. She appears well-developed.  HENT:  Head: Normocephalic and atraumatic.  Eyes: Conjunctivae normal and EOM are normal. No scleral icterus.  Neck: Neck supple. No thyromegaly present.  Cardiovascular: Normal rate and regular rhythm.  Exam reveals no gallop and no friction rub.   No murmur  heard. Pulmonary/Chest: No stridor. She has no wheezes. She has no rales. She exhibits no tenderness.  Abdominal: She exhibits distension (mild). There is tenderness (mildly diffuse). There is no rebound.  Musculoskeletal: Normal range of motion. She exhibits no edema.  Lymphadenopathy:    She has no cervical adenopathy.  Neurological: She is oriented to person, place, and time. Coordination normal.  Skin: No rash noted. No erythema.  Psychiatric: She has a normal mood and affect. Her behavior is normal.    ED Course  Procedures (including critical care time) DIAGNOSTIC STUDIES: Oxygen Saturation is 96% on room air, adequate by my interpretation.    COORDINATION OF CARE: 3:31 PM Discussed ED treatment with pt  4:30 PM Ordered:    . [COMPLETED] morphine  2 mg Intravenous Once  . [COMPLETED] ondansetron  4 mg Intravenous Once       Labs Reviewed  COMPREHENSIVE METABOLIC PANEL - Abnormal; Notable for the following:    Glucose, Bld 119 (*)     Calcium 11.3 (*)     ALT 41 (*)     All other components within normal limits  CBC WITH DIFFERENTIAL  LIPASE, BLOOD   Ct Abdomen Pelvis W Contrast  11/29/2011  *RADIOLOGY REPORT*  Clinical Data: Abdominal pain, previous cholecystectomy  CT ABDOMEN AND PELVIS WITH CONTRAST  Technique:  Multidetector CT imaging of the abdomen and pelvis was performed following the standard protocol during bolus administration of intravenous contrast.  Contrast: OMNIPAQUE IOHEXOL 300 MG/ML  SOLN  Comparison: None.  Findings: Visualized lung bases clear.  Vascular clips in the gallbladder fossa.  Unremarkable liver, spleen, adrenal glands, pancreas.  Small low attenuation renal lesions bilaterally probably cysts, incompletely characterized.  No hydronephrosis.  Stomach is physiologically distended.  The small bowel is nondilated.  Staple line in the ascending colon.  Distal colon is decompressed. Urinary bladder incompletely distended.  Previous  hysterectomy.  No ascites.  No free air.  Portal vein patent.  No adenopathy.  Lumbar spine unremarkable.  IMPRESSION: 1.  No acute abdominal process. 2.  Postop changes as above.   Original Report Authenticated By: D. Andria Rhein, MD    Dg Abd Acute W/chest  11/29/2011  *RADIOLOGY REPORT*  Clinical Data: Vomiting.  ACUTE ABDOMEN SERIES (ABDOMEN 2 VIEW & CHEST 1 VIEW)  Comparison: 07/05/2009.  Findings: The upright chest x-ray demonstrates stable left-sided pleural thickening at the costophrenic sulcus.  No acute cardiopulmonary findings.  Two views of the abdomen demonstrates scattered air in the small bowel and colon with scattered air fluid levels.  Findings most likely due to a diffuse ileus.  Early or partial small bowel obstruction is possible also.  No free air.  The soft tissue shadows are maintained.  The bony structures are intact.  IMPRESSION:  1.  Ileus versus partial or early small bowel obstruction. Recommend correlation with bowel sounds. 2.  No acute cardiopulmonary findings.   Original Report Authenticated By: Rudie Meyer, M.D.      No diagnosis found.    MDM        The chart was scribed for me under my direct supervision.  I personally performed the history, physical, and medical decision making and all procedures in the evaluation of this patient.Alexis Lennert, MD 11/29/11 2250

## 2011-11-29 NOTE — ED Notes (Signed)
Family at bedside. Patient has headache RN made aware.

## 2012-01-14 ENCOUNTER — Emergency Department (INDEPENDENT_AMBULATORY_CARE_PROVIDER_SITE_OTHER): Payer: 59

## 2012-01-14 ENCOUNTER — Emergency Department (HOSPITAL_COMMUNITY)
Admission: EM | Admit: 2012-01-14 | Discharge: 2012-01-14 | Disposition: A | Discharge status: Home / Self Care | Payer: 59 | Attending: Emergency Medicine | Admitting: Emergency Medicine

## 2012-01-14 ENCOUNTER — Emergency Department (HOSPITAL_COMMUNITY)
Admission: EM | Admit: 2012-01-14 | Discharge: 2012-01-14 | Disposition: A | Discharge status: Home / Self Care | Payer: 59 | Source: Home / Self Care | Attending: Emergency Medicine | Admitting: Emergency Medicine

## 2012-01-14 ENCOUNTER — Encounter (HOSPITAL_COMMUNITY): Payer: Self-pay

## 2012-01-14 DIAGNOSIS — I10 Essential (primary) hypertension: Secondary | ICD-10-CM | POA: Insufficient documentation

## 2012-01-14 DIAGNOSIS — R109 Unspecified abdominal pain: Secondary | ICD-10-CM

## 2012-01-14 DIAGNOSIS — R110 Nausea: Secondary | ICD-10-CM | POA: Insufficient documentation

## 2012-01-14 DIAGNOSIS — K529 Noninfective gastroenteritis and colitis, unspecified: Secondary | ICD-10-CM

## 2012-01-14 DIAGNOSIS — Z7982 Long term (current) use of aspirin: Secondary | ICD-10-CM | POA: Insufficient documentation

## 2012-01-14 DIAGNOSIS — R1084 Generalized abdominal pain: Secondary | ICD-10-CM | POA: Insufficient documentation

## 2012-01-14 DIAGNOSIS — Z79899 Other long term (current) drug therapy: Secondary | ICD-10-CM | POA: Insufficient documentation

## 2012-01-14 DIAGNOSIS — E119 Type 2 diabetes mellitus without complications: Secondary | ICD-10-CM | POA: Insufficient documentation

## 2012-01-14 DIAGNOSIS — K5289 Other specified noninfective gastroenteritis and colitis: Secondary | ICD-10-CM | POA: Insufficient documentation

## 2012-01-14 LAB — CBC WITH DIFFERENTIAL/PLATELET
Basophils Absolute: 0 10*3/uL (ref 0.0–0.1)
Basophils Relative: 0 % (ref 0–1)
Eosinophils Absolute: 0.3 10*3/uL (ref 0.0–0.7)
Eosinophils Relative: 4 % (ref 0–5)
HCT: 46 % (ref 36.0–46.0)
Hemoglobin: 15.2 g/dL — ABNORMAL HIGH (ref 12.0–15.0)
Lymphocytes Relative: 30 % (ref 12–46)
Lymphs Abs: 2.5 10*3/uL (ref 0.7–4.0)
MCH: 28.8 pg (ref 26.0–34.0)
MCHC: 33 g/dL (ref 30.0–36.0)
MCV: 87.1 fL (ref 78.0–100.0)
Monocytes Absolute: 0.5 10*3/uL (ref 0.1–1.0)
Monocytes Relative: 6 % (ref 3–12)
Neutro Abs: 5.1 10*3/uL (ref 1.7–7.7)
Neutrophils Relative %: 61 % (ref 43–77)
Platelets: 220 10*3/uL (ref 150–400)
RBC: 5.28 MIL/uL — ABNORMAL HIGH (ref 3.87–5.11)
RDW: 14.2 % (ref 11.5–15.5)
WBC: 8.4 10*3/uL (ref 4.0–10.5)

## 2012-01-14 LAB — COMPREHENSIVE METABOLIC PANEL
ALT: 41 U/L — ABNORMAL HIGH (ref 0–35)
AST: 29 U/L (ref 0–37)
Albumin: 4.1 g/dL (ref 3.5–5.2)
Alkaline Phosphatase: 129 U/L — ABNORMAL HIGH (ref 39–117)
BUN: 11 mg/dL (ref 6–23)
CO2: 19 mEq/L (ref 19–32)
Calcium: 11.1 mg/dL — ABNORMAL HIGH (ref 8.4–10.5)
Chloride: 106 mEq/L (ref 96–112)
Creatinine, Ser: 0.59 mg/dL (ref 0.50–1.10)
GFR calc Af Amer: >90 mL/min (ref >90)
GFR calc non Af Amer: >90 mL/min (ref >90)
Glucose, Bld: 148 mg/dL — ABNORMAL HIGH (ref 70–99)
Potassium: 4.4 mEq/L (ref 3.5–5.1)
Sodium: 139 mEq/L (ref 135–145)
Total Bilirubin: 0.5 mg/dL (ref 0.3–1.2)
Total Protein: 7.6 g/dL (ref 6.0–8.3)

## 2012-01-14 MED ORDER — HYOSCYAMINE SULFATE 0.125 MG PO TABS: 0.1250 mg | ORAL_TABLET | Freq: Four times a day (QID) | ORAL | Status: DC | PRN

## 2012-01-14 MED ORDER — ONDANSETRON HCL 4 MG/2ML IJ SOLN
4.0000 mg | Freq: Once | INTRAMUSCULAR | Status: AC
  Administered 2012-01-14: 4 mg via INTRAVENOUS
  Filled 2012-01-14: qty 2

## 2012-01-14 MED ORDER — PANTOPRAZOLE SODIUM 40 MG IV SOLR
40.0000 mg | Freq: Once | INTRAVENOUS | Status: AC
  Administered 2012-01-14: 40 mg via INTRAVENOUS
  Filled 2012-01-14: qty 40

## 2012-01-14 MED ORDER — KETOROLAC TROMETHAMINE 30 MG/ML IJ SOLN
30.0000 mg | Freq: Once | INTRAMUSCULAR | Status: AC
  Administered 2012-01-14: 30 mg via INTRAVENOUS
  Filled 2012-01-14: qty 1

## 2012-01-14 MED ORDER — ONDANSETRON 4 MG PO TBDP: ORAL_TABLET | ORAL | Status: DC

## 2012-01-14 MED ORDER — SODIUM CHLORIDE 0.9 % IV BOLUS (SEPSIS)
1000.0000 mL | Freq: Once | INTRAVENOUS | Status: AC
  Administered 2012-01-14: 1000 mL via INTRAVENOUS

## 2012-01-14 NOTE — ED Notes (Signed)
Attempted to triage pt x2, pharmacy still at bedside

## 2012-01-14 NOTE — ED Notes (Signed)
Pt transported to xray 

## 2012-01-14 NOTE — ED Provider Notes (Addendum)
History     CSN: [id number]  Arrival date & time 01/14/12  1003   First MD Initiated Contact with Patient 01/14/12 1018      Chief Complaint  Patient presents with  . Abdominal Pain    (Consider location/radiation/quality/duration/timing/severity/associated sxs/prior treatment) HPI Comments: Patient presents urgent care complaining of ongoing "nagging abdominal pain" ( patient points to periumbilical region), woke up this morning and had approximately 10 episodes of diarrhea and her pain has exacerbated some. Denies any fevers, or vomiting. Denies any urinary symptoms. He describes that she was seen in the emergency department in the month of November.   Patient is a 57 y.o. female presenting with abdominal pain. The history is provided by the patient.  Abdominal Pain The primary symptoms of the illness include abdominal pain and diarrhea. The primary symptoms of the illness do not include fever, fatigue, nausea, vomiting, vaginal discharge or vaginal bleeding. The onset of the illness was gradual. The problem has not changed since onset. Additional symptoms associated with the illness include anorexia. Symptoms associated with the illness do not include chills, diaphoresis, constipation, frequency or back pain. Significant associated medical issues do not include inflammatory bowel disease, sickle cell disease, gallstones, diverticulitis or HIV.    Past Medical History  Diagnosis Date  . Diabetes mellitus without complication     Past Surgical History  Procedure Date  . Colon surgery   . Cholecystectomy   . Abdominal hysterectomy   . Carpal tunnel release     History reviewed. No pertinent family history.  History  Substance Use Topics  . Smoking status: Never Smoker   . Smokeless tobacco: Never Used  . Alcohol Use: No    OB History    Grav Para Term Preterm Abortions TAB SAB Ect Mult Living                  Review of Systems  Constitutional: Positive for activity  change. Negative for fever, chills, diaphoresis, appetite change and fatigue.  HENT: Negative for neck stiffness.   Gastrointestinal: Positive for abdominal pain, diarrhea, abdominal distention and anorexia. Negative for nausea, vomiting, constipation, blood in stool, anal bleeding and rectal pain.  Genitourinary: Negative for frequency, vaginal bleeding and vaginal discharge.  Musculoskeletal: Negative for back pain.  Skin: Negative for rash and wound.    Allergies  Parlodel  Home Medications   Current Outpatient Rx  Name  Route  Sig  Dispense  Refill  . ASPIRIN 81 MG PO TABS   Oral   Take 81 mg by mouth every other day.          Marland Kitchen CANAGLIFLOZIN 300 MG PO TABS   Oral   Take 300 mg by mouth daily.         Marland Kitchen DICYCLOMINE HCL 20 MG PO TABS      Take one every 6 hours for abd cramps   20 tablet   0   . ESOMEPRAZOLE MAGNESIUM 40 MG PO CPDR   Oral   Take 40 mg by mouth daily before breakfast.         . FLUCONAZOLE 100 MG PO TABS   Oral   Take 100 mg by mouth daily.         Marland Kitchen LEVOTHYROXINE SODIUM 25 MCG PO TABS   Oral   Take 25 mcg by mouth daily.         Marland Kitchen LOPERAMIDE HCL 2 MG PO CAPS   Oral   Take 2 mg by mouth  as needed. For stomach upset         . NAPROXEN SODIUM 220 MG PO TABS   Oral   Take 220 mg by mouth daily as needed. For pain         . ONDANSETRON HCL 4 MG PO TABS   Oral   Take 1 tablet (4 mg total) by mouth every 6 (six) hours.   12 tablet   0   . PHENTERMINE HCL 37.5 MG PO CAPS   Oral   Take 37.5 mg by mouth every morning.         Marland Kitchen RAMIPRIL 5 MG PO CAPS   Oral   Take 5 mg by mouth daily.         Marland Kitchen VITAMIN D (ERGOCALCIFEROL) 50000 UNITS PO CAPS   Oral   Take 50,000 Units by mouth 2 (two) times a week.            BP 143/73  Pulse 93  Temp 98.3 F (36.8 C) (Oral)  Resp 16  SpO2 95%  Physical Exam  Nursing note and vitals reviewed. Constitutional: Vital signs are normal. She appears well-developed. She is active.   Non-toxic appearance. She does not have a sickly appearance. She does not appear ill. No distress.  Pulmonary/Chest: Effort normal and breath sounds normal.  Abdominal: Soft. She exhibits distension. She exhibits no mass. There is no hepatosplenomegaly, splenomegaly or hepatomegaly. There is tenderness in the periumbilical area. There is no rigidity, no rebound, no guarding, no CVA tenderness, no tenderness at McBurney's point and negative Murphy's sign. No hernia. Hernia confirmed negative in the ventral area.  Neurological: She is alert.  Skin: No erythema.    ED Course  Procedures (including critical care time)  Labs Reviewed - No data to display Dg Abd 1 View  01/14/2012  *RADIOLOGY REPORT*  Clinical Data: Abdominal pain  ABDOMEN - 1 VIEW  Comparison: None.  Findings: Postsurgical changes are seen.  Scattered large and small bowel gas is noted.  Mild dilatation of the small bowel is noted. No free air is seen.  No mass lesion is noted.  IMPRESSION: Mild dilatation of small bowel.  Correlation with physical exam is recommended.  This may represent an early small bowel ileus or partial small bowel obstruction.  Follow-up films recommended to resolution   Original Report Authenticated By: Alcide Clever, M.D.      1. Abdominal pain       MDM  Recurrent abdominal pain- with diarrhea- medical special for an early intestinal fraction and KUB results somewhat consistent. Patient is afebrile will be transferred to the emergency department for further observation and to be considered for surgery consult-      Jimmie Molly, MD 01/14/12 1133  Jimmie Molly, MD 01/14/12 [phone]

## 2012-01-14 NOTE — ED Provider Notes (Signed)
History     CSN: [id number]  Arrival date & time 01/14/12  1157   First MD Initiated Contact with Patient 01/14/12 1259      Chief Complaint  Patient presents with  . Diarrhea  . Abdominal Pain  . Nausea    (Consider location/radiation/quality/duration/timing/severity/associated sxs/prior treatment) Patient is a 57 y.o. female presenting with diarrhea and abdominal pain. The history is provided by the patient (pt complains of diarhea and abd cramps). No language interpreter was used.  Diarrhea The primary symptoms include abdominal pain and diarrhea. Primary symptoms do not include fatigue or rash. The illness began today. The onset was sudden. The problem has not changed since onset. The abdominal pain began today. The abdominal pain has been unchanged since its onset. The abdominal pain is generalized. The abdominal pain does not radiate. The severity of the abdominal pain is 5/10.  The illness does not include back pain.  Abdominal Pain The primary symptoms of the illness include abdominal pain and diarrhea. The primary symptoms of the illness do not include fatigue.  Symptoms associated with the illness do not include hematuria, frequency or back pain.    Past Medical History  Diagnosis Date  . Diabetes mellitus without complication   . Hypertension     Past Surgical History  Procedure Date  . Colon surgery   . Cholecystectomy   . Abdominal hysterectomy   . Carpal tunnel release     History reviewed. No pertinent family history.  History  Substance Use Topics  . Smoking status: Never Smoker   . Smokeless tobacco: Never Used  . Alcohol Use: No    OB History    Grav Para Term Preterm Abortions TAB SAB Ect Mult Living                  Review of Systems  Constitutional: Negative for fatigue.  HENT: Negative for congestion, sinus pressure and ear discharge.   Eyes: Negative for discharge.  Respiratory: Negative for cough.   Cardiovascular: Negative for chest  pain.  Gastrointestinal: Positive for abdominal pain and diarrhea.  Genitourinary: Negative for frequency and hematuria.  Musculoskeletal: Negative for back pain.  Skin: Negative for rash.  Neurological: Negative for seizures and headaches.  Hematological: Negative.   Psychiatric/Behavioral: Negative for hallucinations.    Allergies  Parlodel  Home Medications   Current Outpatient Rx  Name  Route  Sig  Dispense  Refill  . ASPIRIN 81 MG PO TABS   Oral   Take 81 mg by mouth every other day.          Marland Kitchen CANAGLIFLOZIN 300 MG PO TABS   Oral   Take 300 mg by mouth daily.         Marland Kitchen ZYRTEC ALLERGY PO   Oral   Take 1 tablet by mouth daily.         Marland Kitchen ESOMEPRAZOLE MAGNESIUM 40 MG PO CPDR   Oral   Take 40 mg by mouth at bedtime.          Marland Kitchen LEVOTHYROXINE SODIUM 25 MCG PO TABS   Oral   Take 25 mcg by mouth daily.         Marland Kitchen NAPROXEN SODIUM 220 MG PO TABS   Oral   Take 220 mg by mouth daily as needed. For pain         . PHENTERMINE HCL 37.5 MG PO CAPS   Oral   Take 37.5 mg by mouth every morning.         Marland Kitchen  RAMIPRIL 5 MG PO CAPS   Oral   Take 5 mg by mouth daily.         Marland Kitchen VITAMIN D (ERGOCALCIFEROL) 50000 UNITS PO CAPS   Oral   Take 50,000 Units by mouth 2 (two) times a week.          Marland Kitchen HYOSCYAMINE SULFATE 0.125 MG PO TABS   Oral   Take 1 tablet (0.125 mg total) by mouth every 6 (six) hours as needed for cramping.   30 tablet   0     BP 136/75  Pulse 83  Temp 97.3 F (36.3 C) (Oral)  Resp 16  Ht 5\' 4"  (1.626 m)  Wt 190 lb (86.183 kg)  BMI 32.61 kg/m2  SpO2 97%  Physical Exam  Constitutional: She is oriented to person, place, and time. She appears well-developed.  HENT:  Head: Normocephalic and atraumatic.  Eyes: Conjunctivae normal and EOM are normal. No scleral icterus.  Neck: Neck supple. No thyromegaly present.  Cardiovascular: Normal rate and regular rhythm.  Exam reveals no gallop and no friction rub.   No murmur  heard. Pulmonary/Chest: No stridor. She has no wheezes. She has no rales. She exhibits no tenderness.  Abdominal: She exhibits no distension. There is tenderness. There is no rebound.  Musculoskeletal: Normal range of motion. She exhibits no edema.  Lymphadenopathy:    She has no cervical adenopathy.  Neurological: She is oriented to person, place, and time. Coordination normal.  Skin: No rash noted. No erythema.  Psychiatric: She has a normal mood and affect. Her behavior is normal.    ED Course  Procedures (including critical care time)  Labs Reviewed  CBC WITH DIFFERENTIAL - Abnormal; Notable for the following:    RBC 5.28 (*)     Hemoglobin 15.2 (*)     All other components within normal limits  COMPREHENSIVE METABOLIC PANEL - Abnormal; Notable for the following:    Glucose, Bld 148 (*)     Calcium 11.1 (*)     ALT 41 (*)     Alkaline Phosphatase 129 (*)     All other components within normal limits   Dg Abd 1 View  01/14/2012  *RADIOLOGY REPORT*  Clinical Data: Abdominal pain  ABDOMEN - 1 VIEW  Comparison: None.  Findings: Postsurgical changes are seen.  Scattered large and small bowel gas is noted.  Mild dilatation of the small bowel is noted. No free air is seen.  No mass lesion is noted.  IMPRESSION: Mild dilatation of small bowel.  Correlation with physical exam is recommended.  This may represent an early small bowel ileus or partial small bowel obstruction.  Follow-up films recommended to resolution   Original Report Authenticated By: Alcide Clever, M.D.      1. Gastroenteritis       MDM          Benny Lennert, MD 01/15/12 [phone]

## 2012-01-14 NOTE — ED Notes (Addendum)
Pt c/o abd pain, nausea, and freq diarrhea. Pt states she has had >10 episodes since 0730 today. Pt states she has been to [facility] for this before, and was d/c. Pt states "they gave me something that started with a D for my stomach pain. That made me feel better"

## 2012-01-14 NOTE — ED Notes (Signed)
Attempted to triage pt, pharmacy in room

## 2012-01-14 NOTE — ED Notes (Signed)
Pain upper abdominal area for 1 month, comes and goes, never completely goes away. Hist of colon resection due to polyp w removal 1 foot of colon several yrs ago ; most recent since 4 am today w cramping and loose stools; NAD at present

## 2012-01-17 ENCOUNTER — Telehealth (INDEPENDENT_AMBULATORY_CARE_PROVIDER_SITE_OTHER): Payer: Self-pay

## 2012-01-17 NOTE — Telephone Encounter (Signed)
Pt at work. States symptoms have resolved but she is concerned they may come back. Per Dr Ardine Eng request pt advised to start work up with Dr Charm Barges and get referral back to Dr Ewing Schlein to work up symptoms. Pt advised we will be glad to see her if there is any surgical indications. Pt states she has appt with Dr Charm Barges this week and will start work up with her.

## 2012-01-17 NOTE — Telephone Encounter (Signed)
Alexis [name] called stating pt was seen at urgent care and advised she may have slight ileus would she be seen here or by GI ?  I Reviewed this with Dr Gerrit Friends. We have not seen pt since 2011.  Alexis [name] advised pt should see Dr Ewing Schlein to have these symptoms evaluated.

## 2012-01-20 ENCOUNTER — Encounter (HOSPITAL_COMMUNITY): Payer: Self-pay | Admitting: Emergency Medicine

## 2012-01-20 ENCOUNTER — Emergency Department (HOSPITAL_COMMUNITY): Payer: 59

## 2012-01-20 ENCOUNTER — Emergency Department (HOSPITAL_COMMUNITY)
Admission: EM | Admit: 2012-01-20 | Discharge: 2012-01-20 | Disposition: A | Discharge status: Home / Self Care | Payer: 59 | Attending: Emergency Medicine | Admitting: Emergency Medicine

## 2012-01-20 DIAGNOSIS — Z90710 Acquired absence of both cervix and uterus: Secondary | ICD-10-CM | POA: Insufficient documentation

## 2012-01-20 DIAGNOSIS — Z9889 Other specified postprocedural states: Secondary | ICD-10-CM | POA: Insufficient documentation

## 2012-01-20 DIAGNOSIS — E119 Type 2 diabetes mellitus without complications: Secondary | ICD-10-CM | POA: Insufficient documentation

## 2012-01-20 DIAGNOSIS — Z9089 Acquired absence of other organs: Secondary | ICD-10-CM | POA: Insufficient documentation

## 2012-01-20 DIAGNOSIS — I10 Essential (primary) hypertension: Secondary | ICD-10-CM | POA: Insufficient documentation

## 2012-01-20 DIAGNOSIS — Z79899 Other long term (current) drug therapy: Secondary | ICD-10-CM | POA: Insufficient documentation

## 2012-01-20 DIAGNOSIS — R112 Nausea with vomiting, unspecified: Secondary | ICD-10-CM | POA: Insufficient documentation

## 2012-01-20 DIAGNOSIS — R1084 Generalized abdominal pain: Secondary | ICD-10-CM | POA: Insufficient documentation

## 2012-01-20 DIAGNOSIS — R109 Unspecified abdominal pain: Secondary | ICD-10-CM

## 2012-01-20 DIAGNOSIS — Z7982 Long term (current) use of aspirin: Secondary | ICD-10-CM | POA: Insufficient documentation

## 2012-01-20 DIAGNOSIS — R197 Diarrhea, unspecified: Secondary | ICD-10-CM | POA: Insufficient documentation

## 2012-01-20 LAB — COMPREHENSIVE METABOLIC PANEL
ALT: 40 U/L — ABNORMAL HIGH (ref 0–35)
AST: 24 U/L (ref 0–37)
Albumin: 4.4 g/dL (ref 3.5–5.2)
Alkaline Phosphatase: 135 U/L — ABNORMAL HIGH (ref 39–117)
BUN: 10 mg/dL (ref 6–23)
CO2: 25 mEq/L (ref 19–32)
Calcium: 11.7 mg/dL — ABNORMAL HIGH (ref 8.4–10.5)
Chloride: 103 mEq/L (ref 96–112)
Creatinine, Ser: 0.75 mg/dL (ref 0.50–1.10)
GFR calc Af Amer: >90 mL/min (ref >90)
GFR calc non Af Amer: >90 mL/min (ref >90)
Glucose, Bld: 144 mg/dL — ABNORMAL HIGH (ref 70–99)
Potassium: 4 mEq/L (ref 3.5–5.1)
Sodium: 135 mEq/L (ref 135–145)
Total Bilirubin: 0.7 mg/dL (ref 0.3–1.2)
Total Protein: 8.1 g/dL (ref 6.0–8.3)

## 2012-01-20 LAB — CBC WITH DIFFERENTIAL/PLATELET
Basophils Absolute: 0 10*3/uL (ref 0.0–0.1)
Basophils Relative: 0 % (ref 0–1)
Eosinophils Absolute: 0.7 10*3/uL (ref 0.0–0.7)
Eosinophils Relative: 6 % — ABNORMAL HIGH (ref 0–5)
HCT: 48.7 % — ABNORMAL HIGH (ref 36.0–46.0)
Hemoglobin: 16 g/dL — ABNORMAL HIGH (ref 12.0–15.0)
Lymphocytes Relative: 24 % (ref 12–46)
Lymphs Abs: 2.7 10*3/uL (ref 0.7–4.0)
MCH: 29.2 pg (ref 26.0–34.0)
MCHC: 32.9 g/dL (ref 30.0–36.0)
MCV: 88.9 fL (ref 78.0–100.0)
Monocytes Absolute: 0.5 10*3/uL (ref 0.1–1.0)
Monocytes Relative: 5 % (ref 3–12)
Neutro Abs: 7.2 10*3/uL (ref 1.7–7.7)
Neutrophils Relative %: 65 % (ref 43–77)
Platelets: 270 10*3/uL (ref 150–400)
RBC: 5.48 MIL/uL — ABNORMAL HIGH (ref 3.87–5.11)
RDW: 14.5 % (ref 11.5–15.5)
WBC: 11.1 10*3/uL — ABNORMAL HIGH (ref 4.0–10.5)

## 2012-01-20 LAB — URINALYSIS, ROUTINE W REFLEX MICROSCOPIC
Bilirubin Urine: NEGATIVE
Glucose, UA: >1000 mg/dL — AB
Hgb urine dipstick: NEGATIVE
Ketones, ur: 15 mg/dL — AB
Leukocytes, UA: NEGATIVE
Nitrite: NEGATIVE
Protein, ur: NEGATIVE mg/dL
Specific Gravity, Urine: 1.025 (ref 1.005–1.030)
Urobilinogen, UA: 0.2 mg/dL (ref 0.0–1.0)
pH: 5.5 (ref 5.0–8.0)

## 2012-01-20 LAB — CLOSTRIDIUM DIFFICILE BY PCR: Toxigenic C. Difficile by PCR: NEGATIVE

## 2012-01-20 LAB — URINE MICROSCOPIC-ADD ON

## 2012-01-20 LAB — LIPASE, BLOOD: Lipase: 17 U/L (ref 11–59)

## 2012-01-20 MED ORDER — IOHEXOL 300 MG/ML  SOLN
100.0000 mL | Freq: Once | INTRAMUSCULAR | Status: AC | PRN
  Administered 2012-01-20: 100 mL via INTRAVENOUS

## 2012-01-20 MED ORDER — ONDANSETRON HCL 4 MG PO TABS: 4.0000 mg | ORAL_TABLET | Freq: Three times a day (TID) | ORAL | Status: DC | PRN

## 2012-01-20 MED ORDER — FAMOTIDINE IN NACL 20-0.9 MG/50ML-% IV SOLN
20.0000 mg | Freq: Once | INTRAVENOUS | Status: AC
Start: 2012-01-20 — End: 2012-01-20
  Administered 2012-01-20: 20 mg via INTRAVENOUS
  Filled 2012-01-20: qty 50

## 2012-01-20 MED ORDER — CIPROFLOXACIN HCL 500 MG PO TABS: 500.0000 mg | ORAL_TABLET | Freq: Two times a day (BID) | ORAL | Status: DC

## 2012-01-20 MED ORDER — METRONIDAZOLE 500 MG PO TABS: 500.0000 mg | ORAL_TABLET | Freq: Two times a day (BID) | ORAL | Status: DC

## 2012-01-20 MED ORDER — FENTANYL CITRATE 0.05 MG/ML IJ SOLN
50.0000 ug | INTRAMUSCULAR | Status: AC | PRN
  Administered 2012-01-20 (×2): 50 ug via INTRAVENOUS
  Filled 2012-01-20 (×2): qty 2

## 2012-01-20 MED ORDER — ONDANSETRON HCL 4 MG/2ML IJ SOLN
4.0000 mg | INTRAMUSCULAR | Status: DC | PRN
  Administered 2012-01-20: 4 mg via INTRAVENOUS
  Filled 2012-01-20: qty 2

## 2012-01-20 MED ORDER — SODIUM CHLORIDE 0.9 % IV SOLN
INTRAVENOUS | Status: DC
  Administered 2012-01-20: 10:00:00 via INTRAVENOUS

## 2012-01-20 NOTE — ED Notes (Signed)
Gingerale 8 oz. given for po fluid challenge.

## 2012-01-20 NOTE — ED Notes (Signed)
Tolerated po fluids w/o c/o nausea, vomiting.

## 2012-01-20 NOTE — ED Notes (Signed)
Pt continues to have abd pain with vomiting and diarrhea since 11-12-2011.  Pt last seen and worked up on 01-14-12 at [facility] for same.

## 2012-01-20 NOTE — ED Notes (Signed)
Patient ambulatory to restroom  ?

## 2012-01-20 NOTE — ED Provider Notes (Signed)
History     CSN: [id number]  Arrival date & time 01/20/12  7829   First MD Initiated Contact with Patient 01/20/12 [phone]      Chief Complaint  Patient presents with  . Abdominal Pain     HPI Pt was seen at 0930.   Per pt, c/o gradual onset and persistence of constant generalized abd "pain" since yesterday.  Pt states she has had intermittent abd pain for the past 2 months, constant since yesterday.  Has been associated with multiple intermittent episodes of N/V/D.  Describes the abd pain as "nagging" and "cramping." Denies fevers, no back pain, no rash, no CP/SOB, no black or blood in stools or emesis.      GI: Dr. Ewing Schlein Past Medical History  Diagnosis Date  . Diabetes mellitus without complication   . Hypertension     Past Surgical History  Procedure Date  . Colon surgery   . Cholecystectomy   . Abdominal hysterectomy   . Carpal tunnel release   . Appendectomy      History  Substance Use Topics  . Smoking status: Never Smoker   . Smokeless tobacco: Never Used  . Alcohol Use: No    Review of Systems ROS: Statement: All systems negative except as marked or noted in the HPI; Constitutional: Negative for fever and chills. ; ; Eyes: Negative for eye pain, redness and discharge. ; ; ENMT: Negative for ear pain, hoarseness, nasal congestion, sinus pressure and sore throat. ; ; Cardiovascular: Negative for chest pain, palpitations, diaphoresis, dyspnea and peripheral edema. ; ; Respiratory: Negative for cough, wheezing and stridor. ; ; Gastrointestinal: +N/V/D, abd pain. Negative for blood in stool, hematemesis, jaundice and rectal bleeding. . ; ; Genitourinary: Negative for dysuria, flank pain and hematuria. ; ; Musculoskeletal: Negative for back pain and neck pain. Negative for swelling and trauma.; ; Skin: Negative for pruritus, rash, abrasions, blisters, bruising and skin lesion.; ; Neuro: Negative for headache, lightheadedness and neck stiffness. Negative for weakness, altered  level of consciousness , altered mental status, extremity weakness, paresthesias, involuntary movement, seizure and syncope.       Allergies  Parlodel  Home Medications   Current Outpatient Rx  Name  Route  Sig  Dispense  Refill  . ASPIRIN 81 MG PO TABS   Oral   Take 81 mg by mouth every other day.          Marland Kitchen CANAGLIFLOZIN 300 MG PO TABS   Oral   Take 300 mg by mouth daily.         Marland Kitchen ZYRTEC ALLERGY PO   Oral   Take 1 tablet by mouth daily.         Marland Kitchen ESOMEPRAZOLE MAGNESIUM 40 MG PO CPDR   Oral   Take 40 mg by mouth at bedtime.          Marland Kitchen HYOSCYAMINE SULFATE 0.125 MG PO TABS   Oral   Take 1 tablet (0.125 mg total) by mouth every 6 (six) hours as needed for cramping.   30 tablet   0   . LEVOTHYROXINE SODIUM 25 MCG PO TABS   Oral   Take 25 mcg by mouth daily.         Marland Kitchen NAPROXEN SODIUM 220 MG PO TABS   Oral   Take 220 mg by mouth daily as needed. For pain         . ONDANSETRON 4 MG PO TBDP      4mg  ODT q4 hours prn  nausea/vomit   12 tablet   0   . PHENTERMINE HCL 37.5 MG PO CAPS   Oral   Take 37.5 mg by mouth every morning.         Marland Kitchen RAMIPRIL 5 MG PO CAPS   Oral   Take 5 mg by mouth daily.         Marland Kitchen VITAMIN D (ERGOCALCIFEROL) 50000 UNITS PO CAPS   Oral   Take 50,000 Units by mouth 2 (two) times a week.            BP 139/51  Pulse 76  Temp 98.4 F (36.9 C) (Oral)  Resp 20  Ht 5\' 4"  (1.626 m)  Wt 190 lb (86.183 kg)  BMI 32.61 kg/m2  SpO2 96%  Physical Exam 0935: Physical examination:  Nursing notes reviewed; Vital signs and O2 SAT reviewed;  Constitutional: Well developed, Well nourished, Well hydrated, In no acute distress; Head:  Normocephalic, atraumatic; Eyes: EOMI, PERRL, No scleral icterus; ENMT: Mouth and pharynx normal, Mucous membranes moist; Neck: Supple, Full range of motion, No lymphadenopathy; Cardiovascular: Regular rate and rhythm, No murmur, rub, or gallop; Respiratory: Breath sounds clear & equal bilaterally, No  rales, rhonchi, wheezes.  Speaking full sentences with ease, Normal respiratory effort/excursion; Chest: Nontender, Movement normal; Abdomen: Softly distended, +mild diffuse tenderness to palp. No rebound or guarding. Hyperactive bowel sounds;; Extremities: Pulses normal, No tenderness, No edema, No calf edema or asymmetry.; Neuro: AA&Ox3, Major CN grossly intact.  Speech clear. No gross focal motor or sensory deficits in extremities.; Skin: Color normal, Warm, Dry.   ED Course  Procedures    MDM  MDM Reviewed: nursing note, vitals and previous chart Reviewed previous: labs and x-ray Interpretation: labs, x-ray and CT scan   Results for orders placed during the hospital encounter of 01/20/12  URINALYSIS, ROUTINE W REFLEX MICROSCOPIC      Component Value Range   Color, Urine YELLOW  YELLOW   APPearance CLEAR  CLEAR   Specific Gravity, Urine 1.025  1.005 - 1.030   pH 5.5  5.0 - 8.0   Glucose, UA >1000 (*) NEGATIVE mg/dL   Hgb urine dipstick NEGATIVE  NEGATIVE   Bilirubin Urine NEGATIVE  NEGATIVE   Ketones, ur 15 (*) NEGATIVE mg/dL   Protein, ur NEGATIVE  NEGATIVE mg/dL   Urobilinogen, UA 0.2  0.0 - 1.0 mg/dL   Nitrite NEGATIVE  NEGATIVE   Leukocytes, UA NEGATIVE  NEGATIVE  CLOSTRIDIUM DIFFICILE BY PCR      Component Value Range   C difficile by pcr NEGATIVE  NEGATIVE  CBC WITH DIFFERENTIAL      Component Value Range   WBC 11.1 (*) 4.0 - 10.5 K/uL   RBC 5.48 (*) 3.87 - 5.11 MIL/uL   Hemoglobin 16.0 (*) 12.0 - 15.0 g/dL   HCT 96.0 (*) 45.4 - 09.8 %   MCV 88.9  78.0 - 100.0 fL   MCH 29.2  26.0 - 34.0 pg   MCHC 32.9  30.0 - 36.0 g/dL   RDW 11.9  14.7 - 82.9 %   Platelets 270  150 - 400 K/uL   Neutrophils Relative 65  43 - 77 %   Neutro Abs 7.2  1.7 - 7.7 K/uL   Lymphocytes Relative 24  12 - 46 %   Lymphs Abs 2.7  0.7 - 4.0 K/uL   Monocytes Relative 5  3 - 12 %   Monocytes Absolute 0.5  0.1 - 1.0 K/uL   Eosinophils Relative 6 (*) 0 -  5 %   Eosinophils Absolute 0.7  0.0 -  0.7 K/uL   Basophils Relative 0  0 - 1 %   Basophils Absolute 0.0  0.0 - 0.1 K/uL  COMPREHENSIVE METABOLIC PANEL      Component Value Range   Sodium 135  135 - 145 mEq/L   Potassium 4.0  3.5 - 5.1 mEq/L   Chloride 103  96 - 112 mEq/L   CO2 25  19 - 32 mEq/L   Glucose, Bld 144 (*) 70 - 99 mg/dL   BUN 10  6 - 23 mg/dL   Creatinine, Ser 0.98  0.50 - 1.10 mg/dL   Calcium 11.9 (*) 8.4 - 10.5 mg/dL   Total Protein 8.1  6.0 - 8.3 g/dL   Albumin 4.4  3.5 - 5.2 g/dL   AST 24  0 - 37 U/L   ALT 40 (*) 0 - 35 U/L   Alkaline Phosphatase 135 (*) 39 - 117 U/L   Total Bilirubin 0.7  0.3 - 1.2 mg/dL   GFR calc non Af Amer >90  >90 mL/min   GFR calc Af Amer >90  >90 mL/min  LIPASE, BLOOD      Component Value Range   Lipase 17  11 - 59 U/L  URINE MICROSCOPIC-ADD ON      Component Value Range   WBC, UA 0-2  <3 WBC/hpf   Dg Chest 2 View 01/20/2012  *RADIOLOGY REPORT*  Clinical Data: Cough, hypertension, diabetes  CHEST - 2 VIEW  Comparison: 06/25/2009  Findings: Enlargement of cardiac silhouette. Tortuous aorta. Pulmonary vascularity normal. Lungs clear. No pleural effusion, pneumothorax or acute osseous findings.  IMPRESSION: Enlargement of cardiac silhouette.   Original Report Authenticated By: Ulyses Southward, M.D.      Ct Abdomen Pelvis W Contrast 01/20/2012  *RADIOLOGY REPORT*  Clinical Data: Right lower quadrant pain, nausea/vomiting/diarrhea, prior colon surgery for polyp, status post hysterectomy, cholecystectomy, and appendectomy  CT ABDOMEN AND PELVIS WITH CONTRAST  Technique:  Multidetector CT imaging of the abdomen and pelvis was performed following the standard protocol during bolus administration of intravenous contrast.  Contrast: OMNIPAQUE IOHEXOL 300 MG/ML  SOLN  Comparison: 11/29/2011  Findings: Lung bases are clear.  Liver, spleen, pancreas, and adrenal glands are within normal limits.  Status post cholecystectomy.  No intrahepatic or extrahepatic ductal dilatation.  Small probable  bilateral renal cysts.  No hydronephrosis.  No evidence of bowel obstruction.  Postsurgical changes from prior cecal resection and appendectomy.  Distal/terminal ileum is thick- walled although underdistended (series 2/image 46).  No evidence of abdominal aortic aneurysm.  No suspicious abdominopelvic lymphadenopathy.  Trace pelvic ascites.  Status post hysterectomy.  The bilateral ovaries are unremarkable.  Bladder is underdistended.  IMPRESSION: No evidence of bowel obstruction.  Distal/terminal ileum is thick-walled, possibly reflecting underdistension, although infectious/inflammatory enteritis remain possible.  Prior cecal resection.   Original Report Authenticated By: Charline Bills, M.D.    Results for SABINE, TENENBAUM (MRN [id number]) as of 01/20/2012 13:43  Ref. Range 11/29/2011 16:10 01/14/2012 13:32 01/20/2012 09:49  AST Latest Range: 0-37 U/L 26 29 24   ALT Latest Range: 0-35 U/L 41 (H) 41 (H) 40 (H)    Results for RONELLE, MICHIE (MRN [id number]) as of 01/20/2012 13:43  Ref. Range 11/29/2011 16:10 01/14/2012 13:32 01/20/2012 09:49  WBC Latest Range: 4.0-10.5 K/uL 8.3 8.4 11.1 (H)     1315:  Pt has tol PO well while in the ED without N/V.  Stool negative for cdiff.  Pt  has ambulated around the ED with steady gait.  VS remain stable, afebrile.  CT scan with likely underdistention of small bowel but enteritis cannot be ruled out; will tx with abx given recurrence of symptoms and mild WBC elevation since last ED visit.  ALT elevated, per baseline.  Wants to go home now.  Dx and testing d/w pt.  Questions answered.  Verb understanding, agreeable to d/c home with outpt f/u with her GI MD.        Laray Anger, DO 01/21/12 2023

## 2012-01-20 NOTE — ED Notes (Signed)
Left in c/o family for transport home; a&ox4, in no apparent distress. Instructions, prescriptions and f/u information reviewed.

## 2012-01-21 LAB — URINE CULTURE
Colony Count: NO GROWTH
Culture: NO GROWTH

## 2012-01-24 LAB — STOOL CULTURE

## 2012-02-26 ENCOUNTER — Other Ambulatory Visit: Payer: Self-pay

## 2012-04-07 ENCOUNTER — Other Ambulatory Visit: Payer: Self-pay | Admitting: Gastroenterology

## 2012-04-11 DIAGNOSIS — N890 Mild vaginal dysplasia: Secondary | ICD-10-CM

## 2012-04-11 HISTORY — DX: Mild vaginal dysplasia: N89.0

## 2012-05-05 ENCOUNTER — Encounter (INDEPENDENT_AMBULATORY_CARE_PROVIDER_SITE_OTHER): Payer: Self-pay

## 2012-05-17 ENCOUNTER — Other Ambulatory Visit (HOSPITAL_COMMUNITY): Payer: Self-pay | Admitting: *Deleted

## 2012-05-17 ENCOUNTER — Ambulatory Visit (INDEPENDENT_AMBULATORY_CARE_PROVIDER_SITE_OTHER): Payer: 59 | Admitting: Gynecology

## 2012-05-17 ENCOUNTER — Encounter: Payer: Self-pay | Admitting: Gynecology

## 2012-05-17 VITALS — BP 120/78 | Ht 64.0 in | Wt 188.0 lb

## 2012-05-17 DIAGNOSIS — N893 Dysplasia of vagina, unspecified: Secondary | ICD-10-CM

## 2012-05-17 DIAGNOSIS — N898 Other specified noninflammatory disorders of vagina: Secondary | ICD-10-CM

## 2012-05-17 DIAGNOSIS — Z1231 Encounter for screening mammogram for malignant neoplasm of breast: Secondary | ICD-10-CM

## 2012-05-17 NOTE — Patient Instructions (Signed)
Office will call you with the biopsy results 

## 2012-05-17 NOTE — Progress Notes (Signed)
Alexis [name] 26-Apr-1955 [id number]        57 y.o.  [id number] new patient presents in referral from her primary physician where she obtains her routine gynecologic care. Had recent Pap smear of the vaginal cuff which showed LGSIL, cells suspicious for HGSIL. Patient is status post LAVH 2004 by Dr. Rosalio Macadamia for leiomyoma and bleeding. No history of abnormal Pap smears previously.    Past medical history,surgical history, medications, allergies, family history and social history were all reviewed and documented in the EPIC chart. ROS:  Was performed and pertinent positives and negatives are included in the history.  Exam: Kim assistant Filed Vitals:   05/17/12 0851  BP: 120/78  Height: 5\' 4"  (1.626 m)  Weight: 188 lb (85.276 kg)   General appearance  Normal Abdominal  soft, nontender, without masses, organomegaly or hernia Pelvic  Ext/BUS/vagina  normal visually and palpably  Adnexa  Without masses or tenderness    Anus and perineum  normal   Colposcopy after acetic acid cleanse shows area of acetowhite change left lateral upper vagina approximately 1-2 cm below apex of the cuff. Representative biopsy taken. Silver nitrate applied afterwards.     Assessment/Plan:  57 y.o. [id number] female status post LAVH with no history of abnormal Pap smears previously most recent Pap smear of vaginal cuff shows LGSIL with cells suggestive of HGSIL. Colposcopy shows acetowhite area upper right vaginal cuff. Represent a biopsy taken. Patient will followup results in minimal triage based on these results. Low-grade plan expectant management if high-grade discussed possible treatment options to include laser.  Patient due for mammogram now and knows to schedule. Had colonoscopy 03/2012. Never had DEXA and recommend at age 57.     Dara Lords MD, 9:38 AM 05/17/2012

## 2012-05-18 ENCOUNTER — Encounter: Payer: Self-pay | Admitting: Gynecology

## 2012-05-31 ENCOUNTER — Ambulatory Visit (HOSPITAL_COMMUNITY): Payer: 59

## 2012-09-04 ENCOUNTER — Ambulatory Visit (INDEPENDENT_AMBULATORY_CARE_PROVIDER_SITE_OTHER): Payer: 59 | Admitting: Family Medicine

## 2012-09-04 VITALS — Wt 193.0 lb

## 2012-09-04 DIAGNOSIS — E119 Type 2 diabetes mellitus without complications: Secondary | ICD-10-CM

## 2012-09-04 NOTE — Progress Notes (Signed)
Patient presents for 3 month follow of DM as part of the employee sponsored Link to [employer]. Medications and glucose readings have been reviewed. I have also discussed with patient lifestyle interventions such as diet and exercise. Full documentation of this visit can be found in the [employer] documenting system through [facility] [employer] [facility]). However, specific from this visit include the following:  POC A1C 5.7. Recently had some medication changes. Invokana was causing too many yeast infections so she was switched to victoza on 07/21/12. A1C has improved a lot. plan 1.) goat wt by next visit 181 lb 2.) will increase walking and counseled on making sure the exercise gets her HR up Hyperlipidemia: Other not on a statin. I dont have recent cholesterol results. she is a diabetic btwn age [age] so she needs a statin. She will fax me her cholesterol results and I will fax MD to add lipitor.  Hypertension: Other at goal on ramipril  Patient has set a series of personal goals and will follow up in 3 months for further review of DM.

## 2012-11-08 NOTE — Progress Notes (Signed)
Patient ID: Alexis [name], female   DOB: 06/28/1955, 57 y.o.   MRN: [id number] ATTENDING PHYSICIAN NOTE: I have reviewed the chart and agree with the plan as detailed above. Glorya Bartley MD Pager [phone]  

## 2012-11-16 ENCOUNTER — Other Ambulatory Visit: Payer: Self-pay

## 2012-12-14 ENCOUNTER — Ambulatory Visit (INDEPENDENT_AMBULATORY_CARE_PROVIDER_SITE_OTHER): Payer: Self-pay | Admitting: Family Medicine

## 2012-12-14 VITALS — BP 128/54 | HR 73 | Wt 198.0 lb

## 2012-12-14 DIAGNOSIS — E119 Type 2 diabetes mellitus without complications: Secondary | ICD-10-CM

## 2012-12-14 NOTE — Progress Notes (Signed)
Patient presents for 3 mo follow up DM as part of the employee sponsored Link to [employer]. Medications have been reviewed. I have also discussed with patient lifestyle interventions such as diet and exercise. Full documentation of this visit can be found in the caretracker documenting system through [employer] [facility]). However specifics of this medication include the following:  Diabetes Mellitus:POC A1C 5.7, at goal on Victoza. Pt c/o of waking up at 4 am having to go to bathroom and being hot/clammy. She never checks her blood sugar at this time tho.  plan 1.) keep meter beside bed. If lows are occuring may back down to 1.2 mg on the victoza but I want to make sure it's a true low before doing that. Patient will call me if blood sugars are less than 70. 2.) patien'ts job is moving to [employer]. 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   Hyperlipidemia: was not on a statin when I started seeing her due to previous intolerance with lipitor and pravastatin. Doctor called in vytorin after I recommended trying a new statin (I did not recommend vytorin). Patient could not afford the copay and I'm not a fan of ezetimibe anyway. Got it switched to simvastatin by itself and patient is tolerating well.  Hypertension: at goal on ramipril, diastolic slightly low. Will monitor. If still low in 3 mo, may need to back ramipril down to 2.5 mg. I'll go ahead and fax the MD to make her aware to watch for it at her next visit as well.    Cost Savings Intervention Outcomes: Medication changed to generic

## 2013-01-31 NOTE — Progress Notes (Signed)
Patient ID: Alexis [name], female   DOB: 03/16/1955, 57 y.o.   MRN: [id number] ATTENDING PHYSICIAN NOTE: I have reviewed the chart and agree with the plan as detailed above. Emilie Carp MD Pager [phone]  

## 2013-03-29 ENCOUNTER — Ambulatory Visit (INDEPENDENT_AMBULATORY_CARE_PROVIDER_SITE_OTHER): Payer: Self-pay | Admitting: Family Medicine

## 2013-03-29 VITALS — BP 131/81 | HR 68 | Wt 205.0 lb

## 2013-03-29 DIAGNOSIS — E119 Type 2 diabetes mellitus without complications: Secondary | ICD-10-CM

## 2013-03-29 NOTE — Progress Notes (Signed)
Patient presents for 3 mo f/u DM as part of the employee sponsored Link to [employer]. Medications have been reviewed. I have also discussed with patient lifestyle interventions such as diet and exercise. Full documentation of this visit can be found in the caretracker documenting system through [facility]). However specifics from this visit include the following:   Diabetes Mellitus: POC A1C 6.5, at goal. No recommended changes. Patient will increase exercise, will work on walking with the nicer weather. Filled victoza and pen needles today.  Hyperlipidemia:started on simvastatin and had cholesterol drawn monday but doesn't have the results yet. She will email them to me. She is on an appropriate dose.  Hypertension: at goal on ramipril  Patient has set a series of personal goals and will f/u in 3 mo for further review of dM

## 2013-08-02 NOTE — Progress Notes (Signed)
Patient ID: Alexis [name], female   DOB: 11/25/1955, 58 y.o.   MRN: [id number] ATTENDING PHYSICIAN NOTE: I have reviewed the chart and agree with the plan as detailed above. Dorcas Mcmurray MD Pager [phone]

## 2013-08-18 ENCOUNTER — Encounter: Payer: 59 | Attending: *Deleted | Admitting: Dietician

## 2013-08-18 DIAGNOSIS — Z713 Dietary counseling and surveillance: Secondary | ICD-10-CM | POA: Diagnosis present

## 2013-08-18 DIAGNOSIS — E669 Obesity, unspecified: Secondary | ICD-10-CM | POA: Diagnosis not present

## 2013-08-18 NOTE — Progress Notes (Signed)
Patient was seen on 08/18/2013 for the Weight Loss Class at the [facility]. The following learning objectives were met by the patient during this class:   Describe healthy choices in each food group  Describe portion size of foods  Use plate method for meal planning  Demonstrate how to read Nutrition Facts food label  Set realistic goals for weight loss, diet changes, and physical activity.   Goals:  1. Make healthy food choices in each food group.  2. Reduce portion size of foods.  3. Increase fruit and vegetable intake.  4. Use plate method for meal planning.  5. Increase physical activity.   Handouts given:  1. Weight loss tips 2. Meal plan/portion card 2. Plate method  2. Food label handout          Subjective:     Patient ID: Alexis [name], female   DOB: [birth year] 58 y.o.   MRN: [id number]  HPI   Review of Systems     Objective:   Physical Exam     Assessment:         Plan:

## 2013-10-17 ENCOUNTER — Encounter (HOSPITAL_BASED_OUTPATIENT_CLINIC_OR_DEPARTMENT_OTHER): Payer: Self-pay | Admitting: Emergency Medicine

## 2013-10-17 ENCOUNTER — Emergency Department (HOSPITAL_BASED_OUTPATIENT_CLINIC_OR_DEPARTMENT_OTHER)
Admission: EM | Admit: 2013-10-17 | Discharge: 2013-10-17 | Disposition: A | Discharge status: Home / Self Care | Payer: 59 | Attending: Emergency Medicine | Admitting: Emergency Medicine

## 2013-10-17 DIAGNOSIS — S134XXA Sprain of ligaments of cervical spine, initial encounter: Secondary | ICD-10-CM | POA: Diagnosis not present

## 2013-10-17 DIAGNOSIS — Z8719 Personal history of other diseases of the digestive system: Secondary | ICD-10-CM | POA: Diagnosis not present

## 2013-10-17 DIAGNOSIS — Y9389 Activity, other specified: Secondary | ICD-10-CM | POA: Diagnosis not present

## 2013-10-17 DIAGNOSIS — S199XXA Unspecified injury of neck, initial encounter: Secondary | ICD-10-CM | POA: Diagnosis present

## 2013-10-17 DIAGNOSIS — Z7982 Long term (current) use of aspirin: Secondary | ICD-10-CM | POA: Diagnosis not present

## 2013-10-17 DIAGNOSIS — I10 Essential (primary) hypertension: Secondary | ICD-10-CM | POA: Diagnosis not present

## 2013-10-17 DIAGNOSIS — S161XXA Strain of muscle, fascia and tendon at neck level, initial encounter: Secondary | ICD-10-CM

## 2013-10-17 DIAGNOSIS — S4990XA Unspecified injury of shoulder and upper arm, unspecified arm, initial encounter: Secondary | ICD-10-CM | POA: Diagnosis not present

## 2013-10-17 DIAGNOSIS — E119 Type 2 diabetes mellitus without complications: Secondary | ICD-10-CM | POA: Diagnosis not present

## 2013-10-17 DIAGNOSIS — Y92410 Unspecified street and highway as the place of occurrence of the external cause: Secondary | ICD-10-CM | POA: Insufficient documentation

## 2013-10-17 DIAGNOSIS — Z79899 Other long term (current) drug therapy: Secondary | ICD-10-CM | POA: Diagnosis not present

## 2013-10-17 DIAGNOSIS — Z87442 Personal history of urinary calculi: Secondary | ICD-10-CM | POA: Diagnosis not present

## 2013-10-17 MED ORDER — CYCLOBENZAPRINE HCL 10 MG PO TABS: 10.0000 mg | ORAL_TABLET | Freq: Two times a day (BID) | ORAL | Status: DC | PRN

## 2013-10-17 MED ORDER — NAPROXEN 500 MG PO TABS: 500.0000 mg | ORAL_TABLET | Freq: Two times a day (BID) | ORAL | Status: DC
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
## 2013-10-17 NOTE — Discharge Instructions (Signed)

## 2013-10-18 ENCOUNTER — Ambulatory Visit (INDEPENDENT_AMBULATORY_CARE_PROVIDER_SITE_OTHER): Payer: 59 | Admitting: Family Medicine

## 2013-10-18 VITALS — BP 128/54 | HR 79 | Wt 204.0 lb

## 2013-10-18 DIAGNOSIS — E119 Type 2 diabetes mellitus without complications: Secondary | ICD-10-CM

## 2013-10-18 DIAGNOSIS — Z794 Long term (current) use of insulin: Secondary | ICD-10-CM | POA: Insufficient documentation

## 2013-10-18 NOTE — Progress Notes (Signed)
Patient presents for 3 month follow up DM as part of the employee sponsored Link to [employer]. Medications have been reviewed. I have also discussed with patient lifestyle interventions such as diet and exercise. Full documentation of this visit can be found in the [employer] documenting system through [facility]). However, specifics from this visit include the following:  Diabetes Mellitus:  POC A1C 7.1, greatly elevated as patient normally runs in the lower 6's. Patient has gained 2 lb and admits to worsened eating habits. She is only test blood sugar 1-2x/week plan 1.) increase testing to once per day. Will need to develop a pattern of where the patient is going high. Told her to alternate between fasting checks and 2 h PP. 2.) using one touch supplies currently. Informed patient of change coming in January with these no longer being covered for a free copay. She will have MD change her over to contour at november visit. 3.) will call me next week with blood sugars 4.) goal 25 lb wt loss. She will start walking 10 mins/ day to start out with, with goal to increase. 5.) f/u 3 mo but will be following patient's blood sugars.  Patient has set a series of personal goals and will follow in 3 months for further review of DM

## 2013-10-26 ENCOUNTER — Other Ambulatory Visit: Payer: Self-pay

## 2013-11-27 NOTE — Progress Notes (Signed)
Patient ID: Alexis [name], female   DOB: 05-17-1955, 58 y.o.   MRN: [id number] Reviewed: Agree with the documentation and management of our [place].

## 2014-01-19 ENCOUNTER — Encounter: Payer: 59 | Attending: *Deleted | Admitting: Dietician

## 2014-01-19 DIAGNOSIS — Z713 Dietary counseling and surveillance: Secondary | ICD-10-CM | POA: Diagnosis present

## 2014-01-19 NOTE — Progress Notes (Deleted)
Subjective:     Patient ID: Alexis [name], female   DOB: 1955-10-24, 59 y.o.   MRN: [id number]  HPI   Review of Systems     Objective:   Physical Exam     Assessment:     ***    Plan:     ***

## 2014-01-19 NOTE — Progress Notes (Signed)
Patient was seen on 01/19/2014 for the Weight Loss Class at the [facility]. The following learning objectives were met by the patient during this class:   Describe healthy choices in each food group  Describe portion size of foods  Use plate method for meal planning  Demonstrate how to read Nutrition Facts food label  Set realistic goals for weight loss, diet changes, and physical activity.   Goals:  1. Make healthy food choices in each food group.  2. Reduce portion size of foods.  3. Increase fruit and vegetable intake.  4. Use plate method for meal planning.  5. Increase physical activity.    Handouts given:   1. Nutrition Strategies for Weight Loss   2. Meal plan/portion card   3. MyPlate Planner   4. Weight Management Recipe Resources   5. Bake, Broil, Grill   

## 2014-02-05 ENCOUNTER — Ambulatory Visit (INDEPENDENT_AMBULATORY_CARE_PROVIDER_SITE_OTHER): Payer: Self-pay | Admitting: Family Medicine

## 2014-02-05 VITALS — BP 127/54 | HR 68 | Wt 204.0 lb

## 2014-02-05 DIAGNOSIS — E1159 Type 2 diabetes mellitus with other circulatory complications: Secondary | ICD-10-CM | POA: Insufficient documentation

## 2014-02-05 DIAGNOSIS — I152 Hypertension secondary to endocrine disorders: Secondary | ICD-10-CM | POA: Insufficient documentation

## 2014-02-05 DIAGNOSIS — E785 Hyperlipidemia, unspecified: Secondary | ICD-10-CM

## 2014-02-05 DIAGNOSIS — E119 Type 2 diabetes mellitus without complications: Secondary | ICD-10-CM

## 2014-02-05 DIAGNOSIS — I10 Essential (primary) hypertension: Secondary | ICD-10-CM

## 2014-02-05 DIAGNOSIS — E1169 Type 2 diabetes mellitus with other specified complication: Secondary | ICD-10-CM | POA: Insufficient documentation

## 2014-02-05 NOTE — Progress Notes (Signed)
Patient presents for 3 mo f/u DM as part of the employee sponsored Link to [employer]. Medications have been reviewed. I have also discussed with patient lifestyle interventions such as diet and exercise. Full documentation of this visit can be found in the caretracker documenting system through [facility]). However, specifics from this visit include the following:  Diabetes Mellitus:  POC A1C 7.3, increased since last visit from 7.1, MD did add metformin 500 mg once daily, but it was not enough to bring it down. She did not bring her meter but admits to seeing some blood sugars in the 200's. plan 1.) patient will call MD today to ask her if she can go ahead and add another metformin during the day. Her visit with MD is not until March so I told her to go ahead and call and to ask the MD to update her prescription. 2.) f/u 3 mo but patient is supposed to shoot me an email with her doctor's response. Hyperlipidemia: on appropriately dosed statin  Hypertension: at goal, no recommended changes.  Patient has set a series of personal goals and will f/u in 3 mo for further review of DM

## 2014-02-14 ENCOUNTER — Encounter: Payer: 59 | Attending: *Deleted | Admitting: Dietician

## 2014-02-14 VITALS — Ht 64.5 in | Wt 204.6 lb

## 2014-02-14 DIAGNOSIS — Z713 Dietary counseling and surveillance: Secondary | ICD-10-CM | POA: Insufficient documentation

## 2014-02-14 DIAGNOSIS — E669 Obesity, unspecified: Secondary | ICD-10-CM

## 2014-02-14 DIAGNOSIS — E119 Type 2 diabetes mellitus without complications: Secondary | ICD-10-CM

## 2014-02-14 NOTE — Progress Notes (Signed)
  Medical Nutrition Therapy:  Appt start time: 1630 end time:  1730.   Assessment:  Primary concerns today: Alexis [name] is here today for follow up for weight and blood sugar management. She also has hx of diverticulitis. She is known to me from the [facility] employee wellness program and attended Weight Management class here at [facility] last month.  She has also attended the Diabetes Core Classes here in the past (around 2 years ago).  She has gained about 10 lbs over the last year.  She self reports her A1c has increased from 6.2% to 7.2%.  Fasting blood sugar in the morning ranges from 196-206 in the last few weeks.  Her daughter and grandkids now live with her and her and her daughter split the cooking responsibilities.  She states sometimes she has to get on to her daughter for cooking with too much sugar, etc.  Also, she now eats supper later than usual with her new work hours. They eat out about 2 times a week or order thin-crust pizza and salad.  With her job changes over the last year she has had a signigicant amount of stress.  Since Jan 2016 she has been in a better work environment where she can get up and walk regularly throughout the day.  Many of patient's comments reveal she has made changes over the years and has a good base knowledge for managing diabetes.  Her goals are to get her A1c numbers back down below 7%, to lose the weight she has gradually gained, and to help her family get healthier too.  Learning Readiness:  Ready  Change in progress   MEDICATIONS: Victoza shot in the morning, Metformin at dinner since Dec 2015   DIETARY INTAKE:  Usual eating pattern includes 3 meals and 0 snacks per day.  24-hr recall:  B ( AM): tenderloin with gravy and water  Snk ( AM): none L (12:30-1 PM): panera broccolli cheese soup and 1/2 tuna sand with lettuce, tom, and onion, water with sugar free koolaid Snk ( PM): none D (7 PM): taco salads at home Snk ( PM): none Beverages:  water flavored with sugar free flavorings or diet coke, sometimes half and half tea  Usual physical activity: has been taking the steps at work and walking 2-3 times a day at work to and from lunch and the bank, etc.  Estimated energy needs: 1200 calories  Progress Towards Goal(s):  In progress.    Intervention:  Nutrition education and counseling. Reviewed important concepts for weight and BG mgmt.  Discussed possibility of including afternoon snack since meal times are spread further apart now to help reduce overeating/increased hunger when sitting down for evening meal.   Recommended keeping up with her exercise by using a pedometer or logging minutes she walks.  Patient was agreeable to this and has enjoyed adding movement back into her day.  Discussed benefits of making healthy changes together as a family.  Discussed role of stress on BG and brainstormed managing stress.    Teaching Method Utilized: Auditory  Handouts given during visit include:  Diabetes Resources and Recipes  Low Carb Snack Suggestions  Barriers to learning/adherence to lifestyle change: none  Demonstrated degree of understanding via:  Teach Back   Monitoring/Evaluation:  Dietary intake, exercise, labs, and body weight in 3 month(s).

## 2014-02-19 NOTE — Progress Notes (Signed)
Patient ID: Alexis [name], female   DOB: 1955/12/14, 59 y.o.   MRN: [id number] Reviewed: Agree with documentation and management of our [facility] pharmacologist.

## 2014-05-30 ENCOUNTER — Ambulatory Visit: Payer: 59 | Admitting: Dietician

## 2014-07-03 ENCOUNTER — Other Ambulatory Visit (HOSPITAL_COMMUNITY): Payer: Self-pay | Admitting: *Deleted

## 2014-07-03 DIAGNOSIS — Z1231 Encounter for screening mammogram for malignant neoplasm of breast: Secondary | ICD-10-CM

## 2014-07-08 ENCOUNTER — Other Ambulatory Visit: Payer: Self-pay

## 2014-07-10 ENCOUNTER — Ambulatory Visit (HOSPITAL_COMMUNITY)
Admission: RE | Admit: 2014-07-10 | Discharge: 2014-07-10 | Disposition: A | Discharge status: Home / Self Care | Payer: 59 | Source: Ambulatory Visit | Attending: *Deleted | Admitting: *Deleted

## 2014-07-10 DIAGNOSIS — Z1231 Encounter for screening mammogram for malignant neoplasm of breast: Secondary | ICD-10-CM | POA: Insufficient documentation

## 2014-07-16 ENCOUNTER — Ambulatory Visit: Payer: 59

## 2014-08-28 ENCOUNTER — Ambulatory Visit (INDEPENDENT_AMBULATORY_CARE_PROVIDER_SITE_OTHER): Payer: 59 | Admitting: Gynecology

## 2014-08-28 ENCOUNTER — Encounter: Payer: Self-pay | Admitting: Gynecology

## 2014-08-28 ENCOUNTER — Other Ambulatory Visit (HOSPITAL_COMMUNITY)
Admission: RE | Admit: 2014-08-28 | Discharge: 2014-08-28 | Disposition: A | Discharge status: Home / Self Care | Payer: 59 | Source: Ambulatory Visit | Attending: Gynecology | Admitting: Gynecology

## 2014-08-28 VITALS — BP 124/74 | Ht 63.0 in | Wt 198.0 lb

## 2014-08-28 DIAGNOSIS — Z1151 Encounter for screening for human papillomavirus (HPV): Secondary | ICD-10-CM | POA: Insufficient documentation

## 2014-08-28 DIAGNOSIS — Z01419 Encounter for gynecological examination (general) (routine) without abnormal findings: Secondary | ICD-10-CM | POA: Insufficient documentation

## 2014-08-28 DIAGNOSIS — N890 Mild vaginal dysplasia: Secondary | ICD-10-CM | POA: Diagnosis not present

## 2014-08-28 DIAGNOSIS — K429 Umbilical hernia without obstruction or gangrene: Secondary | ICD-10-CM

## 2014-08-28 DIAGNOSIS — N952 Postmenopausal atrophic vaginitis: Secondary | ICD-10-CM | POA: Diagnosis not present

## 2014-08-28 NOTE — Patient Instructions (Signed)
Follow up for Pap smear results.  You may obtain a copy of any labs that were done today by logging onto MyChart as outlined in the instructions provided with your AVS (after visit summary). The office will not call with normal lab results but certainly if there are any significant abnormalities then we will contact you.   Health Maintenance Adopting a healthy lifestyle and getting preventive care can go a long way to promote health and wellness. Talk with your health care provider about what schedule of regular examinations is right for you. This is a good chance for you to check in with your provider about disease prevention and staying healthy. In between checkups, there are plenty of things you can do on your own. Experts have done a lot of research about which lifestyle changes and preventive measures are most likely to keep you healthy. Ask your health care provider for more information. WEIGHT AND DIET  Eat a healthy diet  Be sure to include plenty of vegetables, fruits, low-fat dairy products, and lean protein.  Do not eat a lot of foods high in solid fats, added sugars, or salt.  Get regular exercise. This is one of the most important things you can do for your health.  Most adults should exercise for at least 150 minutes each week. The exercise should increase your heart rate and make you sweat (moderate-intensity exercise).  Most adults should also do strengthening exercises at least twice a week. This is in addition to the moderate-intensity exercise.  Maintain a healthy weight  Body mass index (BMI) is a measurement that can be used to identify possible weight problems. It estimates body fat based on height and weight. Your health care provider can help determine your BMI and help you achieve or maintain a healthy weight.  For females 1 years of age and older:   A BMI below 18.5 is considered underweight.  A BMI of 18.5 to 24.9 is normal.  A BMI of 25 to 29.9 is  considered overweight.  A BMI of 30 and above is considered obese.  Watch levels of cholesterol and blood lipids  You should start having your blood tested for lipids and cholesterol at 60 years of age, then have this test every 5 years.  You may need to have your cholesterol levels checked more often if:  Your lipid or cholesterol levels are high.  You are older than 60 years of age.  You are at high risk for heart disease.  CANCER SCREENING   Lung Cancer  Lung cancer screening is recommended for adults 46-61 years old who are at high risk for lung cancer because of a history of smoking.  A yearly low-dose CT scan of the lungs is recommended for people who:  Currently smoke.  Have quit within the past 15 years.  Have at least a 30-pack-year history of smoking. A pack year is smoking an average of one pack of cigarettes a day for 1 year.  Yearly screening should continue until it has been 15 years since you quit.  Yearly screening should stop if you develop a health problem that would prevent you from having lung cancer treatment.  Breast Cancer  Practice breast self-awareness. This means understanding how your breasts normally appear and feel.  It also means doing regular breast self-exams. Let your health care provider know about any changes, no matter how small.  If you are in your 20s or 30s, you should have a clinical breast exam (CBE)  by a health care provider every 1-3 years as part of a regular health exam.  If you are 22 or older, have a CBE every year. Also consider having a breast X-ray (mammogram) every year.  If you have a family history of breast cancer, talk to your health care provider about genetic screening.  If you are at high risk for breast cancer, talk to your health care provider about having an MRI and a mammogram every year.  Breast cancer gene (BRCA) assessment is recommended for women who have family members with BRCA-related cancers.  BRCA-related cancers include:  Breast.  Ovarian.  Tubal.  Peritoneal cancers.  Results of the assessment will determine the need for genetic counseling and BRCA1 and BRCA2 testing. Cervical Cancer Routine pelvic examinations to screen for cervical cancer are no longer recommended for nonpregnant women who are considered low risk for cancer of the pelvic organs (ovaries, uterus, and vagina) and who do not have symptoms. A pelvic examination may be necessary if you have symptoms including those associated with pelvic infections. Ask your health care provider if a screening pelvic exam is right for you.   The Pap test is the screening test for cervical cancer for women who are considered at risk.  If you had a hysterectomy for a problem that was not cancer or a condition that could lead to cancer, then you no longer need Pap tests.  If you are older than 65 years, and you have had normal Pap tests for the past 10 years, you no longer need to have Pap tests.  If you have had past treatment for cervical cancer or a condition that could lead to cancer, you need Pap tests and screening for cancer for at least 20 years after your treatment.  If you no longer get a Pap test, assess your risk factors if they change (such as having a new sexual partner). This can affect whether you should start being screened again.  Some women have medical problems that increase their chance of getting cervical cancer. If this is the case for you, your health care provider may recommend more frequent screening and Pap tests.  The human papillomavirus (HPV) test is another test that may be used for cervical cancer screening. The HPV test looks for the virus that can cause cell changes in the cervix. The cells collected during the Pap test can be tested for HPV.  The HPV test can be used to screen women 39 years of age and older. Getting tested for HPV can extend the interval between normal Pap tests from three to  five years.  An HPV test also should be used to screen women of any age who have unclear Pap test results.  After 60 years of age, women should have HPV testing as often as Pap tests.  Colorectal Cancer  This type of cancer can be detected and often prevented.  Routine colorectal cancer screening usually begins at 60 years of age and continues through 59 years of age.  Your health care provider may recommend screening at an earlier age if you have risk factors for colon cancer.  Your health care provider may also recommend using home test kits to check for hidden blood in the stool.  A small camera at the end of a tube can be used to examine your colon directly (sigmoidoscopy or colonoscopy). This is done to check for the earliest forms of colorectal cancer.  Routine screening usually begins at age 58.  Direct  examination of the colon should be repeated every 5-10 years through 60 years of age. However, you may need to be screened more often if early forms of precancerous polyps or small growths are found. Skin Cancer  Check your skin from head to toe regularly.  Tell your health care provider about any new moles or changes in moles, especially if there is a change in a mole's shape or color.  Also tell your health care provider if you have a mole that is larger than the size of a pencil eraser.  Always use sunscreen. Apply sunscreen liberally and repeatedly throughout the day.  Protect yourself by wearing long sleeves, pants, a wide-brimmed hat, and sunglasses whenever you are outside. HEART DISEASE, DIABETES, AND HIGH BLOOD PRESSURE   Have your blood pressure checked at least every 1-2 years. High blood pressure causes heart disease and increases the risk of stroke.  If you are between 26 years and 34 years old, ask your health care provider if you should take aspirin to prevent strokes.  Have regular diabetes screenings. This involves taking a blood sample to check your  fasting blood sugar level.  If you are at a normal weight and have a low risk for diabetes, have this test once every three years after 60 years of age.  If you are overweight and have a high risk for diabetes, consider being tested at a younger age or more often. PREVENTING INFECTION  Hepatitis B  If you have a higher risk for hepatitis B, you should be screened for this virus. You are considered at high risk for hepatitis B if:  You were born in a country where hepatitis B is common. Ask your health care provider which countries are considered high risk.  Your parents were born in a high-risk country, and you have not been immunized against hepatitis B (hepatitis B vaccine).  You have HIV or AIDS.  You use needles to inject street drugs.  You live with someone who has hepatitis B.  You have had sex with someone who has hepatitis B.  You get hemodialysis treatment.  You take certain medicines for conditions, including cancer, organ transplantation, and autoimmune conditions. Hepatitis C  Blood testing is recommended for:  Everyone born from [birth year] through 1966.  Anyone with known risk factors for hepatitis C. Sexually transmitted infections (STIs)  You should be screened for sexually transmitted infections (STIs) including gonorrhea and chlamydia if:  You are sexually active and are younger than 60 years of age.  You are older than 60 years of age and your health care provider tells you that you are at risk for this type of infection.  Your sexual activity has changed since you were last screened and you are at an increased risk for chlamydia or gonorrhea. Ask your health care provider if you are at risk.  If you do not have HIV, but are at risk, it may be recommended that you take a prescription medicine daily to prevent HIV infection. This is called pre-exposure prophylaxis (PrEP). You are considered at risk if:  You are sexually active and do not regularly use condoms or  know the HIV status of your partner(s).  You take drugs by injection.  You are sexually active with a partner who has HIV. Talk with your health care provider about whether you are at high risk of being infected with HIV. If you choose to begin PrEP, you should first be tested for HIV. You should then be tested  every 3 months for as long as you are taking PrEP.  PREGNANCY   If you are premenopausal and you may become pregnant, ask your health care provider about preconception counseling.  If you may become pregnant, take 400 to 800 micrograms (mcg) of folic acid every day.  If you want to prevent pregnancy, talk to your health care provider about birth control (contraception). OSTEOPOROSIS AND MENOPAUSE   Osteoporosis is a disease in which the bones lose minerals and strength with aging. This can result in serious bone fractures. Your risk for osteoporosis can be identified using a bone density scan.  If you are 67 years of age or older, or if you are at risk for osteoporosis and fractures, ask your health care provider if you should be screened.  Ask your health care provider whether you should take a calcium or vitamin D supplement to lower your risk for osteoporosis.  Menopause may have certain physical symptoms and risks.  Hormone replacement therapy may reduce some of these symptoms and risks. Talk to your health care provider about whether hormone replacement therapy is right for you.  HOME CARE INSTRUCTIONS   Schedule regular health, dental, and eye exams.  Stay current with your immunizations.   Do not use any tobacco products including cigarettes, chewing tobacco, or electronic cigarettes.  If you are pregnant, do not drink alcohol.  If you are breastfeeding, limit how much and how often you drink alcohol.  Limit alcohol intake to no more than 1 drink per day for nonpregnant women. One drink equals 12 ounces of beer, 5 ounces of wine, or 1 ounces of hard liquor.  Do  not use street drugs.  Do not share needles.  Ask your health care provider for help if you need support or information about quitting drugs.  Tell your health care provider if you often feel depressed.  Tell your health care provider if you have ever been abused or do not feel safe at home. Document Released: 07/13/2010 Document Revised: 05/14/2013 Document Reviewed: 11/29/2012 [facility] Patient Information 2015 [place], [place]. This information is not intended to replace advice given to you by your health care provider. Make sure you discuss any questions you have with your health care provider.

## 2014-08-28 NOTE — Progress Notes (Signed)
Alexis [name] [birth year] [id number]        59 y.o.  [id number] for annual exam.  Several issues noted below.  Past medical history,surgical history, problem list, medications, allergies, family history and social history were all reviewed and documented as reviewed in the EPIC chart.  ROS:  Performed with pertinent positives and negatives included in the history, assessment and plan.   Additional significant findings :  none   Exam: Kim Counsellor Vitals:   08/28/14 1532  BP: 124/74  Height: 5\' 3"  (1.6 m)  Weight: 198 lb (89.812 kg)   General appearance:  Normal affect, orientation and appearance. Skin: Grossly normal HEENT: Without gross lesions.  No cervical or supraclavicular adenopathy. Thyroid normal.  Lungs:  Clear without wheezing, rales or rhonchi Cardiac: RR, without RMG Abdominal:  Soft, nontender, without masses, guarding, rebound, organomegaly or hernia Breasts:  Examined lying and sitting without masses, retractions, discharge or axillary adenopathy. Pelvic:  Ext/BUS/vagina with atrophic changes. Pap/HPV of vaginal cuff  Adnexa  Without masses or tenderness    Anus and perineum  Normal   Rectovaginal  Normal sphincter tone without palpated masses or tenderness.    Assessment/Plan:  59 y.o. [id number] female for annual exam.   1. Postmenopausal/atrophic genital changes. Status post LAVH by Dr. Irven [place] 2004 for leiomyoma/menorrhagia. Without significant hot flushes, night sweats, vaginal dryness. Continue to monitor and report any issues. 2. VAIN 1 2014 on colposcopic biopsy. Patient was to follow up for Pap smear in one year but never did so. Pap smear/HPV of vaginal cuff done today. If normal then continue with annual cytology times several and assuming all negative then go to a less frequent screening interval. If atypical then colposcopy now. Patient understands the importance of follow up. 3. Mammography 06/2014. Continue with annual mammography. SBE monthly  reviewed. 4. Colonoscopy 2014. Repeat at their recommended interval. 5. DEXA never. Plan further into the menopause. Increased calcium vitamin D area 6. Health maintenance. No routine blood work done as this is done through her primary physician's office who follows her for her medical issues. She has an appointment with a endocrinologist to start to follow her diabetes and hypothyroid. Follow up for Pap smear results. Assuming normal than one year follow up stressed.   [name] MD, 4:34 PM 08/28/2014

## 2014-08-29 LAB — URINALYSIS W MICROSCOPIC + REFLEX CULTURE
Bacteria, UA: NONE SEEN [HPF]
Bilirubin Urine: NEGATIVE
Casts: NONE SEEN [LPF]
Hgb urine dipstick: NEGATIVE
Ketones, ur: NEGATIVE
Leukocytes, UA: NEGATIVE
Nitrite: NEGATIVE
Protein, ur: NEGATIVE
Specific Gravity, Urine: 1.029 (ref 1.001–1.035)
Yeast: NONE SEEN [HPF]
pH: 5.5 (ref 5.0–8.0)

## 2014-08-31 LAB — URINE CULTURE: Colony Count: >=100000

## 2014-09-02 LAB — CYTOLOGY - PAP

## 2014-09-05 ENCOUNTER — Ambulatory Visit (INDEPENDENT_AMBULATORY_CARE_PROVIDER_SITE_OTHER): Payer: Self-pay | Admitting: Family Medicine

## 2014-09-05 ENCOUNTER — Ambulatory Visit: Payer: 59

## 2014-09-05 VITALS — BP 130/54 | HR 70 | Wt 199.0 lb

## 2014-09-05 DIAGNOSIS — E119 Type 2 diabetes mellitus without complications: Secondary | ICD-10-CM

## 2014-09-05 NOTE — Progress Notes (Signed)
Patient presents for 3 mo f/u DM as part of the employee sponsored Link to wellness program. Medications have been reviewed. I have also discussed with patient lifestyle interventions such as diet and exercise.   Diabetes-poc A1C 6.8, at goal on max dose metformin and victoza. No recommended changes. Patient lost meter, provided one today. Patient will purchase pedometer with goal of 10,000 steps per day.  Hypertension-at goal, no changes Lipids-not on a statin. Her diabetes alone is an indication for this. Asked her to discuss with her doctor. She is to have lipids drawn soon.  Patient has set a series of personal goals and will f/u in 3 mo for further review of DM.

## 2014-09-18 NOTE — Progress Notes (Signed)
ATTENDING PHYSICIAN NOTE: I have reviewed the chart and agree with the plan as detailed above. Ottie Tillery MD Pager [phone]  

## 2014-11-01 ENCOUNTER — Other Ambulatory Visit: Payer: Self-pay | Admitting: Physician Assistant

## 2014-11-22 ENCOUNTER — Other Ambulatory Visit (HOSPITAL_COMMUNITY): Payer: Self-pay | Admitting: Internal Medicine

## 2014-11-22 DIAGNOSIS — E210 Primary hyperparathyroidism: Secondary | ICD-10-CM

## 2014-12-03 ENCOUNTER — Encounter (HOSPITAL_COMMUNITY)
Admission: RE | Admit: 2014-12-03 | Discharge: 2014-12-03 | Disposition: A | Discharge status: Home / Self Care | Payer: 59 | Source: Ambulatory Visit | Attending: Internal Medicine | Admitting: Internal Medicine

## 2014-12-03 DIAGNOSIS — E210 Primary hyperparathyroidism: Secondary | ICD-10-CM | POA: Diagnosis not present

## 2014-12-03 MED ORDER — TECHNETIUM TC 99M SESTAMIBI GENERIC - CARDIOLITE
25.0000 | Freq: Once | INTRAVENOUS | Status: AC | PRN
  Administered 2014-12-03: 25 via INTRAVENOUS

## 2014-12-12 ENCOUNTER — Ambulatory Visit: Payer: 59

## 2014-12-16 ENCOUNTER — Ambulatory Visit: Payer: Self-pay | Admitting: Family Medicine

## 2014-12-16 VITALS — BP 135/60 | HR 69 | Wt 194.0 lb

## 2014-12-16 DIAGNOSIS — E119 Type 2 diabetes mellitus without complications: Secondary | ICD-10-CM

## 2014-12-16 NOTE — Progress Notes (Signed)
Subjective:  Patient presents today for 3 month diabetes follow-up as part of the employer-sponsored Link to Wellness program.  Current diabetes regimen includes victoza 1.8 mg and metformin 500 mg once daily. Patient also continues on daily ASA, ACE Inhibitor and statin. Patient has a pending appt for this week with endocrinologist. Her endo thinks she may have a tumor on her parathyroid that he will be following up on. He had taken her off metformin as a a trial to see if her stomach issues would subside but added 1 tablet back. In the past, she has been unable to tolerate more than 2 tablets per day due to GI issues. She is also unable to tolerate SGLT-2 inhibitors due to multiple yeast infections.    Assessment:  Diabetes: Most recent A1C was 7.8% which is exceeding goal of less than 7%. Weight is stable from last visit with me.    Follow up with me in 3 months.    Plan/Goals for Next Visit:  1. MD has discussed with patient possibility of increasing metformin back to 2 tablets at last appointment. I recommend this change as well. He is wanting to explore the PTH issue before the addition of a basal insulin but has discussed this as a next step with patient. I also agree with this step since patient has intolerances to other treatments. 2. Patient will email me after appointment with Dr. Buddy Duty with results from the parathyroid scan and will keep me updated    Next appointment to see me is: march 2017

## 2015-01-09 ENCOUNTER — Encounter (HOSPITAL_COMMUNITY): Payer: Self-pay | Admitting: Cardiology

## 2015-01-09 ENCOUNTER — Emergency Department (HOSPITAL_COMMUNITY)
Admission: EM | Admit: 2015-01-09 | Discharge: 2015-01-09 | Disposition: A | Discharge status: Home / Self Care | Payer: PRIVATE HEALTH INSURANCE | Attending: Emergency Medicine | Admitting: Emergency Medicine

## 2015-01-09 DIAGNOSIS — Z8719 Personal history of other diseases of the digestive system: Secondary | ICD-10-CM | POA: Insufficient documentation

## 2015-01-09 DIAGNOSIS — I10 Essential (primary) hypertension: Secondary | ICD-10-CM | POA: Insufficient documentation

## 2015-01-09 DIAGNOSIS — W01198A Fall on same level from slipping, tripping and stumbling with subsequent striking against other object, initial encounter: Secondary | ICD-10-CM | POA: Diagnosis not present

## 2015-01-09 DIAGNOSIS — W19XXXA Unspecified fall, initial encounter: Secondary | ICD-10-CM

## 2015-01-09 DIAGNOSIS — Z8742 Personal history of other diseases of the female genital tract: Secondary | ICD-10-CM | POA: Diagnosis not present

## 2015-01-09 DIAGNOSIS — Y9289 Other specified places as the place of occurrence of the external cause: Secondary | ICD-10-CM | POA: Diagnosis not present

## 2015-01-09 DIAGNOSIS — Z87442 Personal history of urinary calculi: Secondary | ICD-10-CM | POA: Insufficient documentation

## 2015-01-09 DIAGNOSIS — Y9389 Activity, other specified: Secondary | ICD-10-CM | POA: Diagnosis not present

## 2015-01-09 DIAGNOSIS — S0993XA Unspecified injury of face, initial encounter: Secondary | ICD-10-CM | POA: Diagnosis not present

## 2015-01-09 DIAGNOSIS — Z79899 Other long term (current) drug therapy: Secondary | ICD-10-CM | POA: Insufficient documentation

## 2015-01-09 DIAGNOSIS — Z7982 Long term (current) use of aspirin: Secondary | ICD-10-CM | POA: Insufficient documentation

## 2015-01-09 DIAGNOSIS — Z23 Encounter for immunization: Secondary | ICD-10-CM | POA: Insufficient documentation

## 2015-01-09 DIAGNOSIS — S00511A Abrasion of lip, initial encounter: Secondary | ICD-10-CM | POA: Insufficient documentation

## 2015-01-09 DIAGNOSIS — S80211A Abrasion, right knee, initial encounter: Secondary | ICD-10-CM | POA: Diagnosis not present

## 2015-01-09 DIAGNOSIS — Y998 Other external cause status: Secondary | ICD-10-CM | POA: Diagnosis not present

## 2015-01-09 DIAGNOSIS — E119 Type 2 diabetes mellitus without complications: Secondary | ICD-10-CM | POA: Diagnosis not present

## 2015-01-09 DIAGNOSIS — S0031XA Abrasion of nose, initial encounter: Secondary | ICD-10-CM | POA: Diagnosis not present

## 2015-01-09 DIAGNOSIS — S0990XA Unspecified injury of head, initial encounter: Secondary | ICD-10-CM | POA: Insufficient documentation

## 2015-01-09 DIAGNOSIS — Z7984 Long term (current) use of oral hypoglycemic drugs: Secondary | ICD-10-CM | POA: Diagnosis not present

## 2015-01-09 MED ORDER — IBUPROFEN 800 MG PO TABS: 800.0000 mg | ORAL_TABLET | Freq: Three times a day (TID) | ORAL | Status: DC

## 2015-01-09 MED ORDER — IBUPROFEN 400 MG PO TABS
800.0000 mg | ORAL_TABLET | Freq: Once | ORAL | Status: AC
  Administered 2015-01-09: 800 mg via ORAL
  Filled 2015-01-09: qty 2

## 2015-01-09 MED ORDER — TETANUS-DIPHTH-ACELL PERTUSSIS 5-2.5-18.5 LF-MCG/0.5 IM SUSP
0.5000 mL | Freq: Once | INTRAMUSCULAR | Status: AC
  Administered 2015-01-09: 0.5 mL via INTRAMUSCULAR
  Filled 2015-01-09: qty 0.5

## 2015-01-09 NOTE — ED Provider Notes (Signed)
CSN: [id number]     Arrival date & time 01/09/15  1240 History  By signing my name below, I, Eustaquio Maize, attest that this documentation has been prepared under the direction and in the presence of Wendie Simmer, PA-C. Electronically Signed: Eustaquio Maize, ED Scribe. 01/09/2015. 2:12 PM.   Chief Complaint  Patient presents with  . Fall  . Facial Pain   The history is provided by the patient. No language interpreter was used.     HPI Comments: Alexis [name] is a 59 y.o. female who presents to the Emergency Department complaining of sudden onset, constant, 5/10, facial pain mostly at the bridge of nose, between the eyebrows, and upper lip s/p ground level fall that occurred around 12:30 PM today (approxinately 1.5 hours ago). Pt states she tripped over the sidewalk, causing her to fall and hit her face on the concrete. She mentions epistaxis upon impact but reports that bleeding is now controlled. Pt also complains of a mild frontal headache from the pain and an abrasion to her right knee. There was no LOC. She is alert and oriented to person, place, and time. Denies chest pain, abdominal pain, nausea, vomiting, urinary or bowel incontinence, or any other associated symptoms. Pt is not currently on any anticoagulants. Tetanus not up to date.   Past Medical History  Diagnosis Date  . Diabetes mellitus without complication ([place])   . Hypertension   . Diverticulitis   . Kidney stones   . VAIN I (vaginal intraepithelial neoplasia grade I) 04/2012    Colposcopic biopsy   Past Surgical History  Procedure Laterality Date  . Colon surgery    . Cholecystectomy    . Carpal tunnel release    . Appendectomy    . Tubal ligation    . Hysteroscopy  2003  . Lavh  2004    leiomyoma/irregular bleeding   Family History  Problem Relation Age of Onset  . Heart attack Father   . Diabetes Brother    Social History  Substance Use Topics  . Smoking status: Never Smoker   . Smokeless tobacco:  Never Used  . Alcohol Use: No   OB History    Gravida Para Term Preterm AB TAB SAB Ectopic Multiple Living   3 2 2  1  1   2      Review of Systems  HENT: Positive for nosebleeds.        + Facial pain  Cardiovascular: Negative for chest pain.  Gastrointestinal: Negative for nausea, vomiting and anal bleeding.       Negative for bowel incontinence  Genitourinary:       Negative for urinary incontinence  Neurological: Positive for headaches.  All other systems reviewed and are negative.  Allergies  Parlodel  Home Medications   Prior to Admission medications   Medication Sig Start Date End Date Taking? Authorizing Provider  aspirin 81 MG tablet Take 81 mg by mouth every other day.     Historical Provider, MD  Cetirizine HCl (ZYRTEC ALLERGY PO) Take 1 tablet by mouth daily.    Historical Provider, MD  hyoscyamine (OSCIMIN SR) 0.375 MG 12 hr tablet Take 0.375 mg by mouth every 12 (twelve) hours as needed for cramping.    Historical Provider, MD  levothyroxine (SYNTHROID, LEVOTHROID) 25 MCG tablet Take 25 mcg by mouth daily.    Historical Provider, MD  Liraglutide (VICTOZA) 18 MG/3ML SOPN Inject 1.8 mg into the skin daily.    Historical Provider, MD  metFORMIN Marcial Pacas)  500 MG (MOD) 24 hr tablet Take 1,000 mg by mouth daily.     Historical Provider, MD  ondansetron (ZOFRAN) 4 MG tablet Take 4 mg by mouth every 8 (eight) hours as needed for nausea or vomiting.    Historical Provider, MD  pantoprazole (PROTONIX) 40 MG tablet Take 40 mg by mouth daily.    Historical Provider, MD  phentermine 37.5 MG capsule Take 37.5 mg by mouth every morning.    Historical Provider, MD  ramipril (ALTACE) 5 MG capsule Take 5 mg by mouth daily.    Historical Provider, MD   Triage Vitals: BP 160/75 mmHg  Pulse 79  Temp(Src) 97.8 F (36.6 C) (Oral)  Resp 16  SpO2 99%   Physical Exam  Constitutional: She is oriented to person, place, and time. She appears well-developed and well-nourished. No  distress.  HENT:  Head: Normocephalic and atraumatic.  Right Ear: External ear normal.  Left Ear: External ear normal.  Mouth/Throat: Oropharynx is clear and moist. No oropharyngeal exudate.  No hemotympanum. Dried blood in the left nasal canal   Eyes: Conjunctivae and EOM are normal. Pupils are equal, round, and reactive to light. Right eye exhibits no discharge. Left eye exhibits no discharge. No scleral icterus.  Neck: Normal range of motion. Neck supple. No tracheal deviation present.  Cardiovascular: Normal rate, regular rhythm, normal heart sounds and intact distal pulses.  Exam reveals no gallop and no friction rub.   No murmur heard. Pulmonary/Chest: Effort normal and breath sounds normal. No respiratory distress. She has no wheezes. She has no rales. She exhibits no tenderness.  Abdominal: Soft. Bowel sounds are normal. She exhibits no distension and no mass. There is no tenderness. There is no rebound and no guarding.  Musculoskeletal: Normal range of motion. She exhibits no edema or tenderness.  Abrasion to right knee  Lymphadenopathy:    She has no cervical adenopathy.  Neurological: She is alert and oriented to person, place, and time. No cranial nerve deficit. Coordination normal.  Skin: Skin is warm and dry. No rash noted. She is not diaphoretic. No erythema.  Abrasions along nasal bridge, inside bottom lip, and on right knee   Psychiatric: She has a normal mood and affect. Her behavior is normal.  Nursing note and vitals reviewed.   ED Course  Procedures   The wound is cleansed, debrided of foreign material as much as possible, and dressed. The patient is alerted to watch for any signs of infection (redness, pus, pain, increased swelling or fever) and call if such occurs. Home wound care instructions are provided. Tetanus vaccination status reviewed: Tdap booster indicated and given today.   DIAGNOSTIC STUDIES: Oxygen Saturation is 99% on RA, normal by my  interpretation.    COORDINATION OF CARE: 2:03 PM-Discussed treatment plan which includes cleaning wounds, Ibuprofen, and TDap with pt at bedside and pt agreed to plan.    MDM   Final diagnoses:  Fall, initial encounter   Patient non-toxic appearing and VSS. Imaging not indicated. Wounds were cleaned and dressed. Patient may be safely discharged home. Discussed reasons for return. Patient to follow-up with primary care provider. Patient in understanding and agreement with the plan.  I personally performed the services described in this documentation, which was scribed in my presence. The recorded information has been reviewed and is accurate.   Duncan Lions, PA-C 01/19/15 [place], MD 01/23/15 [phone]

## 2015-01-09 NOTE — Discharge Instructions (Signed)
Ms. DEVYNNE RAMBERT,  Nice meeting you! Please follow-up with your primary care provider as needed. Return to the emergency department if you develop chest pain, shortness of breath, confusion, headaches. Feel better soon!  S. Wendie Simmer, PA-C

## 2015-01-09 NOTE — ED Notes (Signed)
Pt reports she tripped on the concrete outside and fell hitting her face. Bleeding controlled at triage. Pt denies any loose teeth.

## 2015-01-14 ENCOUNTER — Ambulatory Visit: Payer: Self-pay | Admitting: Surgery

## 2015-01-14 DIAGNOSIS — E210 Primary hyperparathyroidism: Secondary | ICD-10-CM | POA: Diagnosis not present

## 2015-01-23 MED FILL — CONTOUR NEXT STRIPS: 90 days supply | Qty: 200 | Fill #0

## 2015-01-23 MED FILL — VICTOZA 18 MG/3 ML INJECT P: 18 | 90 days supply | Qty: 27 | Fill #0

## 2015-01-23 MED FILL — PHENTERMINE 37.5 MG TABLET: 37.5 | 90 days supply | Qty: 90 | Fill #0

## 2015-01-23 MED FILL — UNIFINE PENTIPS 32GX5/32: 32G X 4 MM | 90 days supply | Qty: 100 | Fill #0 | Status: TO

## 2015-01-31 DIAGNOSIS — E784 Other hyperlipidemia: Secondary | ICD-10-CM | POA: Diagnosis not present

## 2015-01-31 DIAGNOSIS — I10 Essential (primary) hypertension: Secondary | ICD-10-CM | POA: Diagnosis not present

## 2015-01-31 DIAGNOSIS — E1165 Type 2 diabetes mellitus with hyperglycemia: Secondary | ICD-10-CM | POA: Diagnosis not present

## 2015-01-31 DIAGNOSIS — K219 Gastro-esophageal reflux disease without esophagitis: Secondary | ICD-10-CM | POA: Diagnosis not present

## 2015-02-14 ENCOUNTER — Encounter (HOSPITAL_COMMUNITY): Payer: Self-pay

## 2015-02-14 ENCOUNTER — Encounter (HOSPITAL_COMMUNITY)
Admission: RE | Admit: 2015-02-14 | Discharge: 2015-02-14 | Disposition: A | Discharge status: Home / Self Care | Payer: 59 | Source: Ambulatory Visit | Attending: Surgery | Admitting: Surgery

## 2015-02-14 DIAGNOSIS — E210 Primary hyperparathyroidism: Secondary | ICD-10-CM | POA: Diagnosis not present

## 2015-02-14 DIAGNOSIS — I10 Essential (primary) hypertension: Secondary | ICD-10-CM | POA: Insufficient documentation

## 2015-02-14 DIAGNOSIS — Z01812 Encounter for preprocedural laboratory examination: Secondary | ICD-10-CM | POA: Insufficient documentation

## 2015-02-14 DIAGNOSIS — Z01818 Encounter for other preprocedural examination: Secondary | ICD-10-CM | POA: Insufficient documentation

## 2015-02-14 HISTORY — DX: Unspecified fall, initial encounter: W19.XXXA

## 2015-02-14 HISTORY — DX: Candidiasis, unspecified: B37.9

## 2015-02-14 HISTORY — DX: Hyperparathyroidism, unspecified: E21.3

## 2015-02-14 HISTORY — DX: Other specified postprocedural states: R11.2

## 2015-02-14 HISTORY — DX: Personal history of urinary (tract) infections: Z87.440

## 2015-02-14 HISTORY — DX: Personal history of other diseases of the respiratory system: Z87.09

## 2015-02-14 HISTORY — DX: Unspecified injury of face, initial encounter: S09.93XA

## 2015-02-14 HISTORY — DX: Other specified postprocedural states: Z98.890

## 2015-02-14 LAB — CBC
HCT: 43.4 % (ref 36.0–46.0)
Hemoglobin: 13.8 g/dL (ref 12.0–15.0)
MCH: 28.8 pg (ref 26.0–34.0)
MCHC: 31.8 g/dL (ref 30.0–36.0)
MCV: 90.6 fL (ref 78.0–100.0)
Platelets: 222 10*3/uL (ref 150–400)
RBC: 4.79 MIL/uL (ref 3.87–5.11)
RDW: 13.8 % (ref 11.5–15.5)
WBC: 8 10*3/uL (ref 4.0–10.5)

## 2015-02-14 LAB — BASIC METABOLIC PANEL
Anion gap: 9 (ref 5–15)
BUN: 9 mg/dL (ref 6–20)
CO2: 27 mmol/L (ref 22–32)
Calcium: 10.7 mg/dL — ABNORMAL HIGH (ref 8.9–10.3)
Chloride: 106 mmol/L (ref 101–111)
Creatinine, Ser: 0.61 mg/dL (ref 0.44–1.00)
GFR calc Af Amer: >60 mL/min (ref >60)
GFR calc non Af Amer: >60 mL/min (ref >60)
Glucose, Bld: 116 mg/dL — ABNORMAL HIGH (ref 65–99)
Potassium: 4.3 mmol/L (ref 3.5–5.1)
Sodium: 142 mmol/L (ref 135–145)

## 2015-02-14 NOTE — Patient Instructions (Signed)
Alexis [name]  02/14/2015   Your procedure is scheduled on: Friday February 21, 2015  Report to [facility] Main  Entrance take [place]  elevators to 3rd floor to  Alexis [name] at 5:30 AM.  Call this number if you have problems the morning of surgery [phone]   Remember: ONLY 1 PERSON MAY GO WITH YOU TO SHORT STAY TO GET  READY MORNING OF Alexis [name].  Do not eat food or drink liquids :After Midnight.     Take these medicines the morning of surgery with A SIP OF WATER: Ceterizine (Zyrtec) if needed; Levothyroxine; Pantoprazole (Protonix) DO NOT TAKE ANY DIABETIC MEDICATIONS DAY OF YOUR SURGERY                               You may not have any metal on your body including hair pins and              piercings  Do not wear jewelry, make-up, lotions, powders or perfumes, deodorant             Do not wear nail polish.  Do not shave  48 hours prior to surgery.                Do not bring valuables to the hospital. Alexis [name].  Contacts, dentures or bridgework may not be worn into surgery.     Patients discharged the day of surgery will not be allowed to drive home.  Name and phone number of your driver:Alexis [name] (daughter)  _____________________________________________________________________             [facility] - Preparing for Surgery Before surgery, you can play an important role.  Because skin is not sterile, your skin needs to be as free of germs as possible.  You can reduce the number of germs on your skin by washing with CHG (chlorahexidine gluconate) soap before surgery.  CHG is an antiseptic cleaner which kills germs and bonds with the skin to continue killing germs even after washing. Please DO NOT use if you have an allergy to CHG or antibacterial soaps.  If your skin becomes reddened/irritated stop using the CHG and inform your nurse when you arrive at Short Stay. Do not shave  (including legs and underarms) for at least 48 hours prior to the first CHG shower.  You may shave your face/neck. Please follow these instructions carefully:  1.  Shower with CHG Soap the night before surgery and the  morning of Surgery.  2.  If you choose to wash your hair, wash your hair first as usual with your  normal  shampoo.  3.  After you shampoo, rinse your hair and body thoroughly to remove the  shampoo.                           4.  Use CHG as you would any other liquid soap.  You can apply chg directly  to the skin and wash                       Gently with a scrungie or clean washcloth.  5.  Apply the CHG Soap to your  body ONLY FROM THE NECK DOWN.   Do not use on face/ open                           Wound or open sores. Avoid contact with eyes, ears mouth and genitals (private parts).                       Wash face,  Genitals (private parts) with your normal soap.             6.  Wash thoroughly, paying special attention to the area where your surgery  will be performed.  7.  Thoroughly rinse your body with warm water from the neck down.  8.  DO NOT shower/wash with your normal soap after using and rinsing off  the CHG Soap.                9.  Pat yourself dry with a clean towel.            10.  Wear clean pajamas.            11.  Place clean sheets on your bed the night of your first shower and do not  sleep with pets. Day of Surgery : Do not apply any lotions/deodorants the morning of surgery.  Please wear clean clothes to the hospital/surgery center.  FAILURE TO FOLLOW THESE INSTRUCTIONS MAY RESULT IN THE CANCELLATION OF YOUR SURGERY PATIENT SIGNATURE_________________________________  NURSE SIGNATURE__________________________________  ________________________________________________________________________

## 2015-02-15 LAB — HEMOGLOBIN A1C
Hgb A1c MFr Bld: 8.1 % — ABNORMAL HIGH (ref 4.8–5.6)
Mean Plasma Glucose: 186 mg/dL

## 2015-02-17 NOTE — Progress Notes (Signed)
A1C results in epic per PAT visit 02/14/2015 sent to Dr Harlow Asa

## 2015-02-19 ENCOUNTER — Encounter (HOSPITAL_COMMUNITY): Payer: Self-pay | Admitting: Surgery

## 2015-02-19 DIAGNOSIS — E210 Primary hyperparathyroidism: Secondary | ICD-10-CM | POA: Diagnosis present

## 2015-02-19 NOTE — H&P (Signed)
General Surgery [facility]  Emmaline Kluver DOB: 1955-09-12 Undefined / Language: [name] / Race: White Female  History of Present Illness  The patient is a 60 year old female who presents with a parathyroid neoplasm.  Patient is referred by Dr. Delrae Rend for evaluation of primary hyperparathyroidism. Patient was noted on routine laboratory studies to have elevated calcium levels ranging from 11.0-12.1. Further evaluation included an intact PTH level which was elevated at 84. 24-hour urine collection was performed and was reportedly elevated. Patient does have a history of nephrolithiasis. She complains of chronic fatigue. She has not had a bone density scan performed. Patient underwent nuclear medicine parathyroid scan on December 03, 2014. This demonstrated focal activity at the right inferior position consistent with parathyroid adenoma. Patient has no prior history of neck surgery. There is no family history of parathyroid disease and no family history of other endocrine neoplasms.   Other Problems Back Pain Cholelithiasis Diabetes Mellitus Diverticulosis Gastroesophageal Reflux Disease Hypercholesterolemia Kidney Stone Thyroid Disease  Past Surgical History Breast Biopsy Right. Cesarean Section - 1 Colon Polyp Removal - Colonoscopy Colon Removal - Partial Gallbladder Surgery - Laparoscopic  Diagnostic Studies History  Colonoscopy 1-5 years ago Mammogram within last year Pap Smear 1-5 years ago  Allergies  Parlodel *ANTIPARKINSON AGENTS* Hives.  Medication History Levothyroxine Sodium (25MCG Tablet, Oral) Active. Victoza (18MG /3ML Soln Pen-inj, Subcutaneous) Active. MetFORMIN HCl ER (500MG  Tablet ER 24HR, Oral) Active. Ondansetron HCl (4MG  Tablet, Oral) Active. Pantoprazole Sodium (40MG  Tablet DR, Oral) Active. Ramipril (10MG  Capsule, Oral) Active. Phentermine HCl (37.5MG  Tablet, Oral)  Active. Ibuprofen (800MG  Tablet, Oral) Active. Hyoscyamine Sulfate (0.375MG  Tablet ER 12HR, Oral) Active. ZyrTEC (5MG  Tablet Chewable, Oral) Active. Aspirin (81MG  Tablet, Oral) Active. Medications Reconciled  Social History Alcohol use Remotely quit alcohol use. Caffeine use Carbonated beverages, Tea. No drug use Tobacco use Never smoker.  Family History Colon Polyps Sister. Diabetes Mellitus Brother. Heart Disease Brother, Father. Respiratory Condition Brother.  Pregnancy / Birth History  Age at menarche 33 years. Gravida 2 Maternal age 70-25 Para 2  Review of Systems  General Present- Fatigue and Weight Gain. Not Present- Appetite Loss, Chills, Fever, Night Sweats and Weight Loss. Skin Present- Dryness. Not Present- Change in Wart/Mole, Hives, Jaundice, New Lesions, Non-Healing Wounds, Rash and Ulcer. HEENT Present- Seasonal Allergies and Wears glasses/contact lenses. Not Present- Earache, Hearing Loss, Hoarseness, Nose Bleed, Oral Ulcers, Ringing in the Ears, Sinus Pain, Sore Throat, Visual Disturbances and Yellow Eyes. Breast Not Present- Breast Mass, Breast Pain, Nipple Discharge and Skin Changes. Cardiovascular Not Present- Chest Pain, Difficulty Breathing Lying Down, Leg Cramps, Palpitations, Rapid Heart Rate, Shortness of Breath and Swelling of Extremities. Gastrointestinal Present- Bloating and Indigestion. Not Present- Abdominal Pain, Bloody Stool, Change in Bowel Habits, Chronic diarrhea, Constipation, Difficulty Swallowing, Excessive gas, Gets full quickly at meals, Hemorrhoids, Nausea, Rectal Pain and Vomiting. Female Genitourinary Not Present- Frequency, Nocturia, Painful Urination, Pelvic Pain and Urgency. Musculoskeletal Not Present- Back Pain, Joint Pain, Joint Stiffness, Muscle Pain, Muscle Weakness and Swelling of Extremities. Neurological Not Present- Decreased Memory, Fainting, Headaches, Numbness, Seizures, Tingling, Tremor, Trouble walking and  Weakness. Psychiatric Not Present- Anxiety, Bipolar, Change in Sleep Pattern, Depression, Fearful and Frequent crying. Endocrine Present- Hair Changes. Not Present- Cold Intolerance, Excessive Hunger, Heat Intolerance, Hot flashes and New Diabetes. Hematology Not Present- Easy Bruising, Excessive bleeding, Gland problems, HIV and Persistent Infections.  Vitals  Weight: 196 lb Height: 64in Body Surface Area: 1.94 m Body Mass Index: 33.64 kg/m  Temp.: 74F(Temporal)  Pulse: 73 (Regular)  BP: 130/68 (Sitting, Left Arm, Standard)  Physical Exam  General - appears comfortable, no distress; not diaphorectic  HEENT - normocephalic; sclerae clear, gaze conjugate; mucous membranes moist, dentition good; voice normal; healing abrasion upper lip  Neck - symmetric on extension; no palpable anterior or posterior cervical adenopathy; no palpable masses in the thyroid bed  Chest - clear bilaterally without rhonchi, rales, or wheeze  Cor - regular rhythm with normal rate; no significant murmur  Ext - non-tender without significant edema or lymphedema  Neuro - grossly intact; no tremor   Assessment & Plan  PRIMARY HYPERPARATHYROIDISM (E21.0)  Patient presents with signs and symptoms of primary hyperparathyroidism. She is provided with written literature on parathyroid surgery to review at home.  Patient was diagnosed based on hypercalcemia. Laboratory studies showed an elevated intact PTH level. Nuclear medicine parathyroid scan documents a right inferior parathyroid adenoma.  I have recommended proceeding with minimally invasive parathyroidectomy. This can be done as an outpatient surgical procedure. We discussed risk and benefits of the procedure including recurrent laryngeal nerve injury and the potential for secondary and adenoma. We discussed the postoperative recovery to be anticipated. Patient understands and wishes to proceed with surgery in the near future.  The risks and  benefits of the procedure have been discussed at length with the patient. The patient understands the proposed procedure, potential alternative treatments, and the course of recovery to be expected. All of the patient's questions have been answered at this time. The patient wishes to proceed with surgery.  Earnstine Regal, MD, [facility] Office: [phone]

## 2015-02-20 DIAGNOSIS — E785 Hyperlipidemia, unspecified: Secondary | ICD-10-CM | POA: Diagnosis not present

## 2015-02-20 DIAGNOSIS — E538 Deficiency of other specified B group vitamins: Secondary | ICD-10-CM | POA: Diagnosis not present

## 2015-02-21 ENCOUNTER — Observation Stay (HOSPITAL_COMMUNITY)
Admission: RE | Admit: 2015-02-21 | Discharge: 2015-02-22 | Disposition: A | Discharge status: Home / Self Care | Payer: 59 | Source: Ambulatory Visit | Attending: Surgery | Admitting: Surgery

## 2015-02-21 ENCOUNTER — Ambulatory Visit (HOSPITAL_COMMUNITY): Payer: 59 | Admitting: Certified Registered Nurse Anesthetist

## 2015-02-21 ENCOUNTER — Encounter (HOSPITAL_COMMUNITY)
Admission: RE | Disposition: A | Discharge status: Home / Self Care | Payer: Self-pay | Source: Ambulatory Visit | Attending: Surgery

## 2015-02-21 ENCOUNTER — Encounter (HOSPITAL_COMMUNITY): Payer: Self-pay | Admitting: *Deleted

## 2015-02-21 DIAGNOSIS — Z79899 Other long term (current) drug therapy: Secondary | ICD-10-CM | POA: Insufficient documentation

## 2015-02-21 DIAGNOSIS — E1165 Type 2 diabetes mellitus with hyperglycemia: Secondary | ICD-10-CM | POA: Diagnosis not present

## 2015-02-21 DIAGNOSIS — E119 Type 2 diabetes mellitus without complications: Secondary | ICD-10-CM

## 2015-02-21 DIAGNOSIS — J189 Pneumonia, unspecified organism: Secondary | ICD-10-CM | POA: Diagnosis not present

## 2015-02-21 DIAGNOSIS — E7800 Pure hypercholesterolemia, unspecified: Secondary | ICD-10-CM | POA: Insufficient documentation

## 2015-02-21 DIAGNOSIS — E213 Hyperparathyroidism, unspecified: Secondary | ICD-10-CM | POA: Diagnosis not present

## 2015-02-21 DIAGNOSIS — E785 Hyperlipidemia, unspecified: Secondary | ICD-10-CM | POA: Diagnosis not present

## 2015-02-21 DIAGNOSIS — Z791 Long term (current) use of non-steroidal anti-inflammatories (NSAID): Secondary | ICD-10-CM | POA: Diagnosis not present

## 2015-02-21 DIAGNOSIS — E210 Primary hyperparathyroidism: Secondary | ICD-10-CM | POA: Diagnosis present

## 2015-02-21 DIAGNOSIS — E039 Hypothyroidism, unspecified: Secondary | ICD-10-CM

## 2015-02-21 DIAGNOSIS — Z87442 Personal history of urinary calculi: Secondary | ICD-10-CM

## 2015-02-21 DIAGNOSIS — K219 Gastro-esophageal reflux disease without esophagitis: Secondary | ICD-10-CM | POA: Insufficient documentation

## 2015-02-21 DIAGNOSIS — Z7982 Long term (current) use of aspirin: Secondary | ICD-10-CM

## 2015-02-21 DIAGNOSIS — Z7984 Long term (current) use of oral hypoglycemic drugs: Secondary | ICD-10-CM | POA: Insufficient documentation

## 2015-02-21 DIAGNOSIS — I10 Essential (primary) hypertension: Secondary | ICD-10-CM | POA: Insufficient documentation

## 2015-02-21 DIAGNOSIS — E0810 Diabetes mellitus due to underlying condition with ketoacidosis without coma: Secondary | ICD-10-CM

## 2015-02-21 HISTORY — PX: PARATHYROIDECTOMY: SHX19

## 2015-02-21 LAB — GLUCOSE, CAPILLARY
Glucose-Capillary: 238 mg/dL — ABNORMAL HIGH (ref 65–99)
Glucose-Capillary: 221 mg/dL — ABNORMAL HIGH (ref 65–99)
Glucose-Capillary: 271 mg/dL — ABNORMAL HIGH (ref 65–99)
Glucose-Capillary: 314 mg/dL — ABNORMAL HIGH (ref 65–99)
Glucose-Capillary: 284 mg/dL — ABNORMAL HIGH (ref 65–99)

## 2015-02-21 SURGERY — PARATHYROIDECTOMY
Anesthesia: General

## 2015-02-21 MED ORDER — BUPIVACAINE HCL (PF) 0.25 % IJ SOLN
INTRAMUSCULAR | Status: AC
  Filled 2015-02-21: qty 30

## 2015-02-21 MED ORDER — KCL IN DEXTROSE-NACL 20-5-0.45 MEQ/L-%-% IV SOLN
INTRAVENOUS | Status: DC
  Administered 2015-02-21 – 2015-02-22 (×2): via INTRAVENOUS
  Filled 2015-02-21 (×3): qty 1000

## 2015-02-21 MED ORDER — LIDOCAINE HCL (CARDIAC) 20 MG/ML IV SOLN
INTRAVENOUS | Status: AC
  Filled 2015-02-21: qty 5

## 2015-02-21 MED ORDER — ONDANSETRON HCL 4 MG/2ML IJ SOLN
INTRAMUSCULAR | Status: DC | PRN
  Administered 2015-02-21: 4 mg via INTRAVENOUS

## 2015-02-21 MED ORDER — BUPIVACAINE-EPINEPHRINE (PF) 0.25% -1:200000 IJ SOLN
INTRAMUSCULAR | Status: AC
  Filled 2015-02-21: qty 30

## 2015-02-21 MED ORDER — DEXAMETHASONE SODIUM PHOSPHATE 10 MG/ML IJ SOLN
INTRAMUSCULAR | Status: DC | PRN
  Administered 2015-02-21: 10 mg via INTRAVENOUS

## 2015-02-21 MED ORDER — EPHEDRINE SULFATE 50 MG/ML IJ SOLN
INTRAMUSCULAR | Status: AC
  Filled 2015-02-21: qty 1

## 2015-02-21 MED ORDER — HYOSCYAMINE SULFATE ER 0.375 MG PO TB12
0.3750 mg | ORAL_TABLET | Freq: Two times a day (BID) | ORAL | Status: DC | PRN
  Filled 2015-02-21: qty 1

## 2015-02-21 MED ORDER — LIRAGLUTIDE 18 MG/3ML ~~LOC~~ SOPN: 1.8000 mg | PEN_INJECTOR | Freq: Every morning | SUBCUTANEOUS | Status: DC

## 2015-02-21 MED ORDER — DEXAMETHASONE SODIUM PHOSPHATE 10 MG/ML IJ SOLN
INTRAMUSCULAR | Status: AC
  Filled 2015-02-21: qty 1

## 2015-02-21 MED ORDER — MIDAZOLAM HCL 5 MG/5ML IJ SOLN
INTRAMUSCULAR | Status: DC | PRN
  Administered 2015-02-21: 2 mg via INTRAVENOUS

## 2015-02-21 MED ORDER — SCOPOLAMINE 1 MG/3DAYS TD PT72
MEDICATED_PATCH | TRANSDERMAL | Status: AC
  Filled 2015-02-21: qty 1

## 2015-02-21 MED ORDER — LEVOTHYROXINE SODIUM 25 MCG PO TABS
25.0000 ug | ORAL_TABLET | Freq: Every day | ORAL | Status: DC
  Administered 2015-02-22: 25 ug via ORAL
  Filled 2015-02-21 (×2): qty 1

## 2015-02-21 MED ORDER — FENTANYL CITRATE (PF) 250 MCG/5ML IJ SOLN
INTRAMUSCULAR | Status: AC
  Filled 2015-02-21: qty 5

## 2015-02-21 MED ORDER — ONDANSETRON HCL 4 MG/2ML IJ SOLN: 4.0000 mg | Freq: Four times a day (QID) | INTRAMUSCULAR | Status: DC | PRN

## 2015-02-21 MED ORDER — SUCCINYLCHOLINE CHLORIDE 20 MG/ML IJ SOLN
INTRAMUSCULAR | Status: DC | PRN
  Administered 2015-02-21: 100 mg via INTRAVENOUS

## 2015-02-21 MED ORDER — LACTATED RINGERS IV SOLN
INTRAVENOUS | Status: DC | PRN
  Administered 2015-02-21 (×2): via INTRAVENOUS

## 2015-02-21 MED ORDER — PROPOFOL 10 MG/ML IV BOLUS
INTRAVENOUS | Status: AC
Start: 2015-02-21 — End: 2015-02-21
  Filled 2015-02-21: qty 20

## 2015-02-21 MED ORDER — FENTANYL CITRATE (PF) 100 MCG/2ML IJ SOLN
INTRAMUSCULAR | Status: DC | PRN
  Administered 2015-02-21: 50 ug via INTRAVENOUS
  Administered 2015-02-21: 100 ug via INTRAVENOUS
  Administered 2015-02-21 (×2): 50 ug via INTRAVENOUS

## 2015-02-21 MED ORDER — MEPERIDINE HCL 50 MG/ML IJ SOLN: 6.2500 mg | INTRAMUSCULAR | Status: DC | PRN

## 2015-02-21 MED ORDER — HYDROMORPHONE HCL 1 MG/ML IJ SOLN
0.2500 mg | INTRAMUSCULAR | Status: DC | PRN
  Administered 2015-02-21 (×2): 0.5 mg via INTRAVENOUS

## 2015-02-21 MED ORDER — SUGAMMADEX SODIUM 200 MG/2ML IV SOLN
INTRAVENOUS | Status: DC | PRN
  Administered 2015-02-21: 200 mg via INTRAVENOUS

## 2015-02-21 MED ORDER — ONDANSETRON HCL 4 MG/2ML IJ SOLN
INTRAMUSCULAR | Status: AC
  Filled 2015-02-21: qty 2

## 2015-02-21 MED ORDER — ACETAMINOPHEN 325 MG PO TABS: 650.0000 mg | ORAL_TABLET | Freq: Four times a day (QID) | ORAL | Status: DC | PRN

## 2015-02-21 MED ORDER — METFORMIN HCL ER 500 MG PO TB24
500.0000 mg | ORAL_TABLET | Freq: Two times a day (BID) | ORAL | Status: DC
  Administered 2015-02-21 – 2015-02-22 (×2): 500 mg via ORAL
  Filled 2015-02-21 (×4): qty 1

## 2015-02-21 MED ORDER — BUPIVACAINE HCL 0.25 % IJ SOLN
INTRAMUSCULAR | Status: DC | PRN
  Administered 2015-02-21: 30 mL

## 2015-02-21 MED ORDER — PANTOPRAZOLE SODIUM 40 MG PO TBEC
40.0000 mg | DELAYED_RELEASE_TABLET | Freq: Every day | ORAL | Status: DC
  Administered 2015-02-21 – 2015-02-22 (×2): 40 mg via ORAL
  Filled 2015-02-21 (×2): qty 1

## 2015-02-21 MED ORDER — HYDROMORPHONE HCL 1 MG/ML IJ SOLN: 1.0000 mg | INTRAMUSCULAR | Status: DC | PRN

## 2015-02-21 MED ORDER — HYDROCODONE-ACETAMINOPHEN 5-325 MG PO TABS: 1.0000 | ORAL_TABLET | ORAL | Status: DC | PRN

## 2015-02-21 MED ORDER — HYDROMORPHONE HCL 1 MG/ML IJ SOLN
INTRAMUSCULAR | Status: AC
  Filled 2015-02-21: qty 1

## 2015-02-21 MED ORDER — ROCURONIUM BROMIDE 100 MG/10ML IV SOLN
INTRAVENOUS | Status: DC | PRN
  Administered 2015-02-21 (×2): 10 mg via INTRAVENOUS
  Administered 2015-02-21: 30 mg via INTRAVENOUS

## 2015-02-21 MED ORDER — SUGAMMADEX SODIUM 200 MG/2ML IV SOLN
INTRAVENOUS | Status: AC
  Filled 2015-02-21: qty 2

## 2015-02-21 MED ORDER — RAMIPRIL 5 MG PO CAPS
5.0000 mg | ORAL_CAPSULE | Freq: Every day | ORAL | Status: DC
  Administered 2015-02-21 – 2015-02-22 (×2): 5 mg via ORAL
  Filled 2015-02-21 (×2): qty 1

## 2015-02-21 MED ORDER — CEFAZOLIN SODIUM-DEXTROSE 2-3 GM-% IV SOLR
INTRAVENOUS | Status: AC
  Filled 2015-02-21: qty 50

## 2015-02-21 MED ORDER — INSULIN ASPART 100 UNIT/ML ~~LOC~~ SOLN
0.0000 [IU] | Freq: Every day | SUBCUTANEOUS | Status: DC
  Administered 2015-02-21: 3 [IU] via SUBCUTANEOUS

## 2015-02-21 MED ORDER — 0.9 % SODIUM CHLORIDE (POUR BTL) OPTIME
TOPICAL | Status: DC | PRN
  Administered 2015-02-21: 1000 mL

## 2015-02-21 MED ORDER — SODIUM CHLORIDE 0.9 % IJ SOLN
INTRAMUSCULAR | Status: AC
  Filled 2015-02-21: qty 10

## 2015-02-21 MED ORDER — ROCURONIUM BROMIDE 100 MG/10ML IV SOLN
INTRAVENOUS | Status: AC
  Filled 2015-02-21: qty 1

## 2015-02-21 MED ORDER — INSULIN ASPART 100 UNIT/ML ~~LOC~~ SOLN
0.0000 [IU] | Freq: Three times a day (TID) | SUBCUTANEOUS | Status: DC
  Administered 2015-02-21: 8 [IU] via SUBCUTANEOUS
  Administered 2015-02-21: 11 [IU] via SUBCUTANEOUS
  Administered 2015-02-22 (×2): 5 [IU] via SUBCUTANEOUS

## 2015-02-21 MED ORDER — PROPOFOL 10 MG/ML IV BOLUS
INTRAVENOUS | Status: DC | PRN
  Administered 2015-02-21: 200 mg via INTRAVENOUS

## 2015-02-21 MED ORDER — LIDOCAINE HCL 4 % MT SOLN
OROMUCOSAL | Status: DC | PRN
  Administered 2015-02-21: 4 mL via TOPICAL

## 2015-02-21 MED ORDER — ONDANSETRON 4 MG PO TBDP: 4.0000 mg | ORAL_TABLET | Freq: Four times a day (QID) | ORAL | Status: DC | PRN

## 2015-02-21 MED ORDER — HYDROCODONE-ACETAMINOPHEN 5-325 MG PO TABS
1.0000 | ORAL_TABLET | ORAL | Status: DC | PRN
  Administered 2015-02-21 (×2): 2 via ORAL
  Administered 2015-02-22: 1 via ORAL
  Filled 2015-02-21: qty 2
  Filled 2015-02-21: qty 1
  Filled 2015-02-21: qty 2

## 2015-02-21 MED ORDER — SCOPOLAMINE 1 MG/3DAYS TD PT72
MEDICATED_PATCH | TRANSDERMAL | Status: DC | PRN
  Administered 2015-02-21: 1 via TRANSDERMAL

## 2015-02-21 MED ORDER — ACETAMINOPHEN 650 MG RE SUPP: 650.0000 mg | Freq: Four times a day (QID) | RECTAL | Status: DC | PRN

## 2015-02-21 MED ORDER — CEFAZOLIN SODIUM-DEXTROSE 2-3 GM-% IV SOLR
2.0000 g | INTRAVENOUS | Status: AC
  Administered 2015-02-21: 2 g via INTRAVENOUS

## 2015-02-21 MED ORDER — MIDAZOLAM HCL 2 MG/2ML IJ SOLN
INTRAMUSCULAR | Status: AC
  Filled 2015-02-21: qty 2

## 2015-02-21 MED ORDER — ONDANSETRON HCL 4 MG/2ML IJ SOLN: 4.0000 mg | Freq: Once | INTRAMUSCULAR | Status: DC | PRN

## 2015-02-21 SURGICAL SUPPLY — 39 items
APL SKNCLS STERI-STRIP NONHPOA (GAUZE/BANDAGES/DRESSINGS) ×1
ATTRACTOMAT 16X20 MAGNETIC DRP (DRAPES) ×2 IMPLANT
BENZOIN TINCTURE PRP APPL 2/3 (GAUZE/BANDAGES/DRESSINGS) ×1 IMPLANT
BLADE HEX COATED 2.75 (ELECTRODE) ×2 IMPLANT
BLADE SURG 15 STRL LF DISP TIS (BLADE) ×1 IMPLANT
BLADE SURG 15 STRL SS (BLADE) ×2
CHLORAPREP W/TINT 26ML (MISCELLANEOUS) ×2 IMPLANT
CLIP TI MEDIUM 6 (CLIP) ×4 IMPLANT
CLIP TI WIDE RED SMALL 6 (CLIP) ×4 IMPLANT
COVER SURGICAL LIGHT HANDLE (MISCELLANEOUS) ×2 IMPLANT
DRAPE LAPAROTOMY T 98X78 PEDS (DRAPES) ×2 IMPLANT
DRESSING SURGICEL FIBRLLR 1X2 (HEMOSTASIS) ×1 IMPLANT
DRSG SURGICEL FIBRILLAR 1X2 (HEMOSTASIS) ×2
ELECT PENCIL ROCKER SW 15FT (MISCELLANEOUS) ×2 IMPLANT
ELECT REM PT RETURN 9FT ADLT (ELECTROSURGICAL) ×2
ELECTRODE REM PT RTRN 9FT ADLT (ELECTROSURGICAL) ×1 IMPLANT
GAUZE SPONGE 4X4 12PLY STRL (GAUZE/BANDAGES/DRESSINGS) ×1 IMPLANT
GAUZE SPONGE 4X4 16PLY XRAY LF (GAUZE/BANDAGES/DRESSINGS) ×2 IMPLANT
GLOVE SURG ORTHO 8.0 STRL STRW (GLOVE) ×2 IMPLANT
GOWN STRL REUS W/TWL XL LVL3 (GOWN DISPOSABLE) ×6 IMPLANT
KIT BASIN OR (CUSTOM PROCEDURE TRAY) ×2 IMPLANT
LIQUID BAND (GAUZE/BANDAGES/DRESSINGS) IMPLANT
NDL HYPO 25X1 1.5 SAFETY (NEEDLE) ×1 IMPLANT
NEEDLE HYPO 25X1 1.5 SAFETY (NEEDLE) ×2 IMPLANT
PACK BASIC VI WITH GOWN DISP (CUSTOM PROCEDURE TRAY) ×2 IMPLANT
STAPLER VISISTAT 35W (STAPLE) ×2 IMPLANT
STRIP CLOSURE SKIN 1/2X4 (GAUZE/BANDAGES/DRESSINGS) ×1 IMPLANT
SUT MNCRL AB 4-0 PS2 18 (SUTURE) ×2 IMPLANT
SUT SILK 2 0 (SUTURE)
SUT SILK 2-0 18XBRD TIE 12 (SUTURE) IMPLANT
SUT SILK 3 0 (SUTURE)
SUT SILK 3-0 18XBRD TIE 12 (SUTURE) IMPLANT
SUT VIC AB 3-0 SH 18 (SUTURE) ×3 IMPLANT
SYR BULB IRRIGATION 50ML (SYRINGE) ×2 IMPLANT
SYR CONTROL 10ML LL (SYRINGE) ×2 IMPLANT
TAPE CLOTH SURG 4X10 WHT LF (GAUZE/BANDAGES/DRESSINGS) ×1 IMPLANT
TOWEL OR 17X26 10 PK STRL BLUE (TOWEL DISPOSABLE) ×2 IMPLANT
TOWEL OR NON WOVEN STRL DISP B (DISPOSABLE) ×2 IMPLANT
YANKAUER SUCT BULB TIP 10FT TU (MISCELLANEOUS) ×2 IMPLANT

## 2015-02-21 NOTE — Op Note (Signed)
OPERATIVE REPORT - PARATHYROIDECTOMY  Preoperative diagnosis: Primary hyperparathyroidism  Postop diagnosis: Same  Procedure: right parathyroidectomy  Surgeon:  Earnstine Regal, MD, FACS  Anesthesia: Gen. endotracheal  Estimated blood loss: Minimal  Preparation: ChloraPrep  Indications: Patient is referred by Dr. Delrae Rend for evaluation of primary hyperparathyroidism. Patient was noted on routine laboratory studies to have elevated calcium levels ranging from 11.0-12.1. Further evaluation included an intact PTH level which was elevated at 84. 24-hour urine collection was performed and was reportedly elevated. Patient does have a history of nephrolithiasis. She complains of chronic fatigue. She has not had a bone density scan performed. Patient underwent nuclear medicine parathyroid scan on December 03, 2014. This demonstrated focal activity at the right inferior position consistent with parathyroid adenoma.  Procedure: Patient was prepared in the holding area. He was brought to operating room and placed in a supine position on the operating room table. Following administration of general anesthesia, the patient was positioned and then prepped and draped in the usual strict aseptic fashion. After ascertaining that an adequate level of anesthesia been achieved, a neck incision was made with a #15 blade. Dissection was carried through subcutaneous tissues and platysma. Hemostasis was obtained with the electrocautery. Skin flaps were developed circumferentially and a [place]it[place] retractor was placed for exposure.  Strap muscles were incised in the midline. Strap muscles were reflected exposing the thyroid lobe. With gentle blunt dissection the thyroid lobe was mobilized.  Dissection was carried through adipose tissue and an enlarged parathyroid gland was identified. It was gently mobilized. Due to the enlarged multinodular thyroid gland, the parathyroid could not be adequately visualized  throught the minimally invasive incision.  Therefore the incision was extended across the midline, subplatysmal flaps raised, and the [place] retractor placed for exposure.  Strap muscles were incised fully in the midline and the right thyroid lobe was mobilized by dividing venous tributaries.  The enlarged parathyroid gland was then mobilized entirely. Vascular structures were divided between small and medium ligaclips. Care was taken to avoid the recurrent laryngeal nerve and the esophagus. The parathyroid gland was completely excised. It was submitted to pathology where frozen section confirmed hypercellular parathyroid tissue.  Neck was irrigated with warm saline and good hemostasis was noted. Fibrillar was placed in the operative field. Strap muscles were reapproximated in the midline with interrupted 3-0 Vicryl sutures. Platysma was closed with interrupted 3-0 Vicryl sutures. Skin was closed with a running 4-0 Monocryl subcuticular suture. Wound was washed and dried and benzoin and Steri-Strips were applied. Sterile gauze dressings were applied. Patient was awakened from anesthesia and brought to the recovery room. The patient tolerated the procedure well.   Earnstine Regal, MD, [facility]

## 2015-02-21 NOTE — Transfer of Care (Signed)
Immediate Anesthesia Transfer of Care Note  Patient: Alexis [name]  Procedure(s) Performed: Procedure(s): RIGHT PARATHYROIDECTOMY (N/A)  Patient Location: PACU  Anesthesia Type:General  Level of Consciousness:  sedated, patient cooperative and responds to stimulation  Airway & Oxygen Therapy:Patient Spontanous Breathing and Patient connected to face mask oxgen  Post-op Assessment:  Report given to PACU RN and Post -op Vital signs reviewed and stable  Post vital signs:  Reviewed and stable  Last Vitals:  Filed Vitals:   02/21/15 0532  BP: 156/56  Pulse: 77  Temp: 36.4 C  Resp: 16    Complications: No apparent anesthesia complications

## 2015-02-21 NOTE — Anesthesia Postprocedure Evaluation (Signed)
Anesthesia Post Note  Patient: Alexis [name]  Procedure(s) Performed: Procedure(s) (LRB): RIGHT PARATHYROIDECTOMY (N/A)  Patient location during evaluation: PACU Anesthesia Type: General Level of consciousness: awake and alert Pain management: pain level controlled Vital Signs Assessment: post-procedure vital signs reviewed and stable Respiratory status: spontaneous breathing, nonlabored ventilation, respiratory function stable and patient connected to nasal cannula oxygen Cardiovascular status: blood pressure returned to baseline and stable Postop Assessment: no signs of nausea or vomiting Anesthetic complications: no    Last Vitals:  Filed Vitals:   02/21/15 1001 02/21/15 1015  BP:  151/68  Pulse: 79 72  Temp:  37 C  Resp: 14 15    Last Pain: There were no vitals filed for this visit.               [place]

## 2015-02-21 NOTE — Interval H&P Note (Signed)
History and Physical Interval Note:  02/21/2015 7:09 AM  Alexis [name]  has presented today for surgery, with the diagnosis of Primary hyperthyroidism.  The various methods of treatment have been discussed with the patient and family. After consideration of risks, benefits and other options for treatment, the patient has consented to    Procedure(s): PARATHYROIDECTOMY (N/A) as a surgical intervention .    The patient's history has been reviewed, patient examined, no change in status, stable for surgery.  I have reviewed the patient's chart and labs.  Questions were answered to the patient's satisfaction.    Earnstine Regal, MD, [facility] Office: [place]

## 2015-02-21 NOTE — Anesthesia Procedure Notes (Signed)
Procedure Name: Intubation Date/Time: 02/21/2015 7:32 AM Performed by: Maxwell Caul Pre-anesthesia Checklist: Patient identified, Emergency Drugs available, Suction available and Patient being monitored Patient Re-evaluated:Patient Re-evaluated prior to inductionOxygen Delivery Method: Circle System Utilized Preoxygenation: Pre-oxygenation with 100% oxygen Intubation Type: IV induction Ventilation: Mask ventilation without difficulty Laryngoscope Size: Mac and 4 Grade View: Grade III Tube type: Oral Tube size: 7.5 mm Number of attempts: 2 Airway Equipment and Method: Stylet and LTA kit utilized Placement Confirmation: ETT inserted through vocal cords under direct vision,  positive ETCO2 and breath sounds checked- equal and bilateral Secured at: 21 cm Tube secured with: Tape Dental Injury: Teeth and Oropharynx as per pre-operative assessment

## 2015-02-21 NOTE — Anesthesia Preprocedure Evaluation (Signed)
Anesthesia Evaluation  Patient identified by MRN, date of birth, ID band Patient awake    Reviewed: Allergy & Precautions, NPO status , Patient's Chart, lab work & pertinent test results  History of Anesthesia Complications (+) PONV  Airway Mallampati: I  TM Distance: >3 FB Neck ROM: Full    Dental   Pulmonary    Pulmonary exam normal        Cardiovascular hypertension, Pt. on medications Normal cardiovascular exam     Neuro/Psych    GI/Hepatic   Endo/Other  diabetes, Type 2, Oral Hypoglycemic Agents  Renal/GU      Musculoskeletal   Abdominal   Peds  Hematology   Anesthesia Other Findings   Reproductive/Obstetrics                             Anesthesia Physical Anesthesia Plan  ASA: II  Anesthesia Plan: General   Post-op Pain Management:    Induction: Intravenous  Airway Management Planned: Oral ETT  Additional Equipment:   Intra-op Plan:   Post-operative Plan: Extubation in OR  Informed Consent: I have reviewed the patients History and Physical, chart, labs and discussed the procedure including the risks, benefits and alternatives for the proposed anesthesia with the patient or authorized representative who has indicated his/her understanding and acceptance.     Plan Discussed with: CRNA and Surgeon  Anesthesia Plan Comments:         Anesthesia Quick Evaluation

## 2015-02-22 ENCOUNTER — Emergency Department (HOSPITAL_COMMUNITY): Payer: 59

## 2015-02-22 ENCOUNTER — Telehealth: Payer: Self-pay | Admitting: Surgery

## 2015-02-22 ENCOUNTER — Observation Stay (HOSPITAL_BASED_OUTPATIENT_CLINIC_OR_DEPARTMENT_OTHER)
Admission: EM | Admit: 2015-02-22 | Discharge: 2015-02-24 | Disposition: A | Discharge status: Home / Self Care | Payer: 59 | Source: Home / Self Care | Attending: Emergency Medicine | Admitting: Emergency Medicine

## 2015-02-22 ENCOUNTER — Encounter (HOSPITAL_COMMUNITY): Payer: Self-pay | Admitting: Emergency Medicine

## 2015-02-22 DIAGNOSIS — R739 Hyperglycemia, unspecified: Secondary | ICD-10-CM | POA: Diagnosis not present

## 2015-02-22 DIAGNOSIS — K219 Gastro-esophageal reflux disease without esophagitis: Secondary | ICD-10-CM | POA: Insufficient documentation

## 2015-02-22 DIAGNOSIS — E210 Primary hyperparathyroidism: Secondary | ICD-10-CM | POA: Diagnosis not present

## 2015-02-22 DIAGNOSIS — E1169 Type 2 diabetes mellitus with other specified complication: Secondary | ICD-10-CM | POA: Diagnosis present

## 2015-02-22 DIAGNOSIS — Z7984 Long term (current) use of oral hypoglycemic drugs: Secondary | ICD-10-CM | POA: Insufficient documentation

## 2015-02-22 DIAGNOSIS — Z791 Long term (current) use of non-steroidal anti-inflammatories (NSAID): Secondary | ICD-10-CM | POA: Insufficient documentation

## 2015-02-22 DIAGNOSIS — E119 Type 2 diabetes mellitus without complications: Secondary | ICD-10-CM | POA: Insufficient documentation

## 2015-02-22 DIAGNOSIS — Z7982 Long term (current) use of aspirin: Secondary | ICD-10-CM | POA: Diagnosis not present

## 2015-02-22 DIAGNOSIS — Z794 Long term (current) use of insulin: Secondary | ICD-10-CM

## 2015-02-22 DIAGNOSIS — E785 Hyperlipidemia, unspecified: Secondary | ICD-10-CM

## 2015-02-22 DIAGNOSIS — I152 Hypertension secondary to endocrine disorders: Secondary | ICD-10-CM | POA: Diagnosis present

## 2015-02-22 DIAGNOSIS — E7800 Pure hypercholesterolemia, unspecified: Secondary | ICD-10-CM | POA: Insufficient documentation

## 2015-02-22 DIAGNOSIS — I10 Essential (primary) hypertension: Secondary | ICD-10-CM

## 2015-02-22 DIAGNOSIS — J189 Pneumonia, unspecified organism: Secondary | ICD-10-CM | POA: Diagnosis present

## 2015-02-22 DIAGNOSIS — R509 Fever, unspecified: Secondary | ICD-10-CM | POA: Diagnosis not present

## 2015-02-22 DIAGNOSIS — Z79899 Other long term (current) drug therapy: Secondary | ICD-10-CM | POA: Diagnosis not present

## 2015-02-22 DIAGNOSIS — R1084 Generalized abdominal pain: Secondary | ICD-10-CM | POA: Diagnosis not present

## 2015-02-22 DIAGNOSIS — E1165 Type 2 diabetes mellitus with hyperglycemia: Secondary | ICD-10-CM | POA: Diagnosis not present

## 2015-02-22 DIAGNOSIS — M542 Cervicalgia: Secondary | ICD-10-CM | POA: Diagnosis not present

## 2015-02-22 DIAGNOSIS — E1159 Type 2 diabetes mellitus with other circulatory complications: Secondary | ICD-10-CM | POA: Diagnosis present

## 2015-02-22 DIAGNOSIS — R531 Weakness: Secondary | ICD-10-CM | POA: Diagnosis not present

## 2015-02-22 LAB — BASIC METABOLIC PANEL
Anion gap: 9 (ref 5–15)
Anion gap: 10 (ref 5–15)
BUN: 15 mg/dL (ref 6–20)
BUN: 25 mg/dL — ABNORMAL HIGH (ref 6–20)
CO2: 24 mmol/L (ref 22–32)
CO2: 22 mmol/L (ref 22–32)
Calcium: 9.3 mg/dL (ref 8.9–10.3)
Calcium: 9.3 mg/dL (ref 8.9–10.3)
Chloride: 102 mmol/L (ref 101–111)
Chloride: 103 mmol/L (ref 101–111)
Creatinine, Ser: 0.8 mg/dL (ref 0.44–1.00)
Creatinine, Ser: 1.15 mg/dL — ABNORMAL HIGH (ref 0.44–1.00)
GFR calc Af Amer: >60 mL/min (ref >60)
GFR calc Af Amer: 59 mL/min — ABNORMAL LOW (ref >60)
GFR calc non Af Amer: >60 mL/min (ref >60)
GFR calc non Af Amer: 51 mL/min — ABNORMAL LOW (ref >60)
Glucose, Bld: 275 mg/dL — ABNORMAL HIGH (ref 65–99)
Glucose, Bld: 399 mg/dL — ABNORMAL HIGH (ref 65–99)
Potassium: 4.8 mmol/L (ref 3.5–5.1)
Potassium: 5 mmol/L (ref 3.5–5.1)
Sodium: 135 mmol/L (ref 135–145)
Sodium: 135 mmol/L (ref 135–145)

## 2015-02-22 LAB — CBC
HCT: 39.9 % (ref 36.0–46.0)
Hemoglobin: 12.8 g/dL (ref 12.0–15.0)
MCH: 28.4 pg (ref 26.0–34.0)
MCHC: 32.1 g/dL (ref 30.0–36.0)
MCV: 88.5 fL (ref 78.0–100.0)
Platelets: 235 10*3/uL (ref 150–400)
RBC: 4.51 MIL/uL (ref 3.87–5.11)
RDW: 14.1 % (ref 11.5–15.5)
WBC: 10.9 10*3/uL — ABNORMAL HIGH (ref 4.0–10.5)

## 2015-02-22 LAB — GLUCOSE, CAPILLARY
Glucose-Capillary: 241 mg/dL — ABNORMAL HIGH (ref 65–99)
Glucose-Capillary: 232 mg/dL — ABNORMAL HIGH (ref 65–99)

## 2015-02-22 LAB — CBG MONITORING, ED: Glucose-Capillary: 358 mg/dL — ABNORMAL HIGH (ref 65–99)

## 2015-02-22 MED ORDER — SODIUM CHLORIDE 0.9 % IV BOLUS (SEPSIS)
1000.0000 mL | Freq: Once | INTRAVENOUS | Status: AC
  Administered 2015-02-22: 1000 mL via INTRAVENOUS

## 2015-02-22 NOTE — Discharge Instructions (Signed)
[facility]  THYROID & PARATHYROID SURGERY:  POST-OP INSTRUCTIONS  Always review your discharge instruction sheet from the facility where your surgery was performed.  A prescription for pain medication may be given to you upon discharge.  Take your pain medication as prescribed.  If narcotic pain medicine is not needed, then you may take acetaminophen (Tylenol) or ibuprofen (Advil) as needed.  Take your usually prescribed medications unless otherwise directed.  If you need a refill on your pain medication, please contact your pharmacy. They will contact our office to request authorization.  Prescriptions will not be processed by our office after 5 pm or on weekends.  Start with a light diet upon arrival home, such as soup and crackers or toast.  Be sure to drink plenty of fluids daily.  Resume your normal diet the day after surgery.  Most patients will experience some swelling and bruising on the chest and neck area.  Ice packs will help.  Swelling and bruising can take several days to resolve.   It is common to experience some constipation after surgery.  Increasing fluid intake and taking a stool softener will usually help or prevent this problem.  A mild laxative (Milk of Magnesia or Miralax) should be taken according to package directions if there has been no bowel movement after 48 hours.  You have steri-strips and a gauze dressing over your incision.  You may remove the gauze bandage on the second day after surgery, and you may shower at that time.  Leave your steri-strips (small skin tapes) in place directly over the incision.  These strips should remain on the skin for 5-7 days and then be removed.  You may get them wet in the shower and pat them dry.  You may resume regular (light) daily activities beginning the next day - such as daily self-care, walking, climbing stairs - gradually increasing activities as tolerated.  You may have sexual intercourse when it is  comfortable.  Refrain from any heavy lifting or straining until approved by your doctor.  You may drive when you no longer are taking prescription pain medication, you can comfortably wear a seatbelt, and you can safely maneuver your car and apply brakes.  You should see your doctor in the office for a follow-up appointment approximately two to three weeks after your surgery.  Make sure that you call for this appointment within a day or two after you arrive home to insure a convenient appointment time.  WHEN TO CALL YOUR DOCTOR: -- Fever greater than 101.5 -- Inability to urinate -- Nausea and/or vomiting - persistent -- Extreme swelling or bruising -- Continued bleeding from incision -- Increased pain, redness, or drainage from the incision -- Difficulty swallowing or breathing -- Muscle cramping or spasms -- Numbness or tingling in hands or around lips  The clinic staff is available to answer your questions during regular business hours.  Please dont hesitate to call and ask to speak to one of the nurses if you have concerns.  Earnstine Regal, MD, [facility] Office: [phone]  Website: [email or web address]  Managing Pain  Pain after surgery or related to activity is often due to strain/injury to muscle, tendon, nerves and/or incisions.  This pain is usually short-term and will improve in a few months.   Many people find it helpful to do the following things TOGETHER to help speed the process of healing and to get back to regular activity more quickly:  1. Avoid heavy physical activity at first a. No lifting greater than 20 pounds at first, then increase to lifting as tolerated over the next few weeks b. Do not push through the pain.  Listen to your body and avoid positions and maneuvers than reproduce the pain.  Wait a few days before trying something more intense c. Walking is okay as tolerated, but go slowly and  stop when getting sore.  If you can walk 30 minutes without stopping or pain, you can try more intense activity (running, jogging, aerobics, cycling, swimming, treadmill, sex, sports, weightlifting, etc ) d. Remember: If it hurts to do it, then dont do it!  2. Take Acetaminophen Anti-inflammatory medication i. Acetaminophen 500mg  tabs (Tylenol) 1-2 pills with every meal and just before bedtime (avoid if you have liver problems) ii. Take with food/snack around the clock for 1-2 weeks iii. This helps the muscle and nerve tissues become less irritable and calm down faster  3. Use a Heating pad or Ice/Cold Pack a. 4-6 times a day b. May use warm bath/hottub  or showers  4. Try Gentle Massage and/or Stretching  a. at the area of pain many times a day b. stop if you feel pain - do not overdo it  Try these steps together to help you body heal faster and avoid making things get worse.  Doing just one of these things may not be enough.    If you are not getting better after two weeks or are noticing you are getting worse, contact our office for further advice; we may need to re-evaluate you & see what other things we can do to help.  Parathyroidectomy A parathyroidectomy is surgery to remove one or more parathyroid glands. These glands are in your neck. They produce a hormone (parathyroid hormone) that helps to control the level of calcium in your body. Each gland is very small, about the size of a pea. Most people have four parathyroid glands. You may have a parathyroidectomy if your body produces too much parathyroid hormone (hyperparathyroidism). This is usually caused by one or more of your parathyroid glands becoming enlarged from a type of noncancerous tumor (adenoma). One of four methods may be used for a parathyroidectomy. An open, minimally invasive, video-assisted, or endoscopic procedure may be done. Some steps of the procedure will vary depending on the method being used. LET [facility]  CARE PROVIDER KNOW ABOUT:  Any allergies you have.  All medicines you are taking, including vitamins, herbs, eye drops, creams, and over-the-counter medicines.  Previous problems you or members of your family have had with the use of anesthetics.  Any blood disorders you have.  Previous surgeries you have had.  Medical conditions you have. RISKS AND COMPLICATIONS  Generally, this is a safe procedure. However, problems can occur and include:  Excessive bleeding.  Infection near the incision.  Slow healing.  Pooling of blood under the skin in the incision area (hematoma).  Blood clots.  Damage to:  The nerves in your neck.  Skin (scarring).  Surrounding blood vessels.  Need for additional surgery.  A hoarse or weak voice.  A condition in which your body does not make enough parathyroid hormone (hypoparathyroidism). This is rare.  Difficulty breathing. This is rare. BEFORE THE PROCEDURE  Ask your health care provider:  About changing or stopping your regular medicines. This is especially important if you are taking diabetes medicines or blood thinners.  If you can take medicines such as aspirin and ibuprofen.  These medicines can thin your blood. Do not take these medicines if your health care provider directs you not to.  Do not eat or drink anything after midnight on the night before the procedure or as directed by your health care provider.  You might be asked to shower or wash with an antibacterial soap.  Plan to have someone take you home after the procedure. PROCEDURE  An IV tube will be inserted into a vein in your hand or arm. Medicine will flow through this tube directly into your body.  You may be given a medicine to help you relax (sedative).  You will be given a medicine that makes you go to sleep (general anesthetic).  As soon as you are asleep, the surgeon will make the incisions needed for the type of procedure that you are having.  Open  parathyroidectomy. For this procedure, the surgeon will make a single incision in the center of your neck. The incision will be about 2-4 inches long.  Minimally invasive parathyroidectomy. For this procedure, the surgeon will make one small incision in the side of your neck. This smaller incision will be about 1-2 inches long. Before the procedure, you might be given an injection of a type of medicine that will help the surgeon to locate the gland.  Video-assisted parathyroidectomy. For this procedure, two small incisions will be made in your neck. One incision is for the instruments that will be used to remove the gland. The other incision is for a tiny camera that helps the surgeon to see inside your neck.  Endoscopic parathyroidectomy. For this procedure, the surgeon uses a tool that is like a small, flexible, tubelike telescope (endoscope). The endoscope is inserted through an incision that is made just above your collarbone. This method reduces the scarring and pain of a parathyroidectomy.  The surgeon will remove the gland or glands that are causing problems and then close the incisions using stitches or other methods. The stitches are often hidden under the skin. AFTER THE PROCEDURE  Your blood pressure, heart rate, breathing rate, and blood oxygen level will be monitored often until the medicines you were given have worn off.  Your blood will be tested to check the calcium level in your body.  You may have numbness around your mouth or in your fingers or toes for a day or two after the surgery. This is caused by a low level of calcium. You may have to take calcium supplements.   This information is not intended to replace advice given to you by your health care provider. Make sure you discuss any questions you have with your health care provider.   Document Released: 03/26/2008 Document Revised: 01/18/2014 Document Reviewed: 05/23/2013 Elsevier Interactive Patient Education [employer]

## 2015-02-22 NOTE — Discharge Summary (Signed)
Physician Discharge Summary  Patient ID: Alexis [name] MRN: [id number] DOB/AGE: 07/10/1955 60 y.o.  Admit date: 02/21/2015 Discharge date: 02/22/2015  Patient Care Team: [name], DO as PCP - [facility] as [place] (Pharmacist) [name], MD as Consulting Physician (General Surgery)  Admission Diagnoses: Principal Problem:   Hyperparathyroidism, primary [facility]) Active Problems:   Primary hyperparathyroidism [facility])   Discharge Diagnoses:  Principal Problem:   Hyperparathyroidism, primary ([place]) Active Problems:   Primary hyperparathyroidism ([place])    POST-OPERATIVE DIAGNOSIS:   Primary hyperparathyroidism  SURGERY:  02/21/2015  Procedure(s): RIGHT PARATHYROIDECTOMY  SURGEON:    Surgeon(s): [name], MD  Consults: None  Hospital Course:   The patient underwent the surgery above.  Postoperatively, the patient gradually mobilized and advanced to a solid diet.  Pain and other symptoms were treated aggressively.    By the time of discharge, the patient was walking well the hallways, eating food, having flatus.  Pain was well-controlled on an oral medications.  Calcium 9.5.  Based on meeting discharge criteria and continuing to recover, I felt it was safe for the patient to be discharged from the hospital to further recover with close followup. Postoperative recommendations were discussed in detail.  They are written as well.   Significant Diagnostic Studies:  Results for orders placed or performed during the hospital encounter of 02/21/15 (from the past 72 hour(s))  Glucose, capillary     Status: Abnormal   Collection Time: 02/21/15  5:36 AM  Result Value Ref Range   Glucose-Capillary 238 (H) 65 - 99 mg/dL  Glucose, capillary     Status: Abnormal   Collection Time: 02/21/15  9:36 AM  Result Value Ref Range   Glucose-Capillary 221 (H) 65 - 99 mg/dL  Glucose, capillary     Status: Abnormal   Collection Time: 02/21/15 11:37 AM  Result Value Ref Range   Glucose-Capillary 271 (H) 65 - 99 mg/dL  Glucose, capillary     Status: Abnormal   Collection Time: 02/21/15  5:21 PM  Result Value Ref Range   Glucose-Capillary 314 (H) 65 - 99 mg/dL  Glucose, capillary     Status: Abnormal   Collection Time: 02/21/15  9:51 PM  Result Value Ref Range   Glucose-Capillary 284 (H) 65 - 99 mg/dL  Basic metabolic panel     Status: Abnormal   Collection Time: 02/22/15  4:41 AM  Result Value Ref Range   Sodium 135 135 - 145 mmol/L   Potassium 4.8 3.5 - 5.1 mmol/L   Chloride 102 101 - 111 mmol/L   CO2 24 22 - 32 mmol/L   Glucose, Bld 275 (H) 65 - 99 mg/dL   BUN 15 6 - 20 mg/dL   Creatinine, Ser 0.80 0.44 - 1.00 mg/dL   Calcium 9.3 8.9 - 10.3 mg/dL   GFR calc non Af Amer >60 >60 mL/min   GFR calc Af Amer >60 >60 mL/min    Comment: (NOTE) The eGFR has been calculated using the CKD EPI equation. This calculation has not been validated in all clinical situations. eGFR's persistently <60 mL/min signify possible Chronic Kidney Disease.    Anion gap 9 5 - 15  Glucose, capillary     Status: Abnormal   Collection Time: 02/22/15  7:34 AM  Result Value Ref Range   Glucose-Capillary 241 (H) 65 - 99 mg/dL  Glucose, capillary     Status: Abnormal   Collection Time: 02/22/15 11:47 AM  Result Value Ref  Range   Glucose-Capillary 232 (H) 65 - 99 mg/dL    No results found.  Discharge Exam: Blood pressure 104/47, pulse 61, temperature 98.1 F (36.7 C), temperature source Oral, resp. rate 16, height _0  (1.626 m), weight 89.926 kg (198 lb 4 oz), SpO2 98 %.  General: Pt awake/alert/oriented x4 in no major acute distress Eyes: PERRL, normal EOM. Sclera nonicteric Neuro: CN II-XII intact w/o focal sensory/motor deficits. Lymph: No head/neck/groin lymphadenopathy Psych:  No delerium/psychosis/paranoia HENT: Normocephalic, Mucus membranes moist.  No thrush Neck: Supple, No tracheal deviation.   Dressing clean & dry - removed.  Normal healing ridge.  Talking normally w/o hoarseness.  No problems with swallowing/speaking. Chest: No pain.  Good respiratory excursion. CV:  Pulses intact.  Regular rhythm MS: Normal AROM mjr joints.  No obvious deformity Abdomen: Soft, Nondistended.  Nontender.  No incarcerated hernias. Ext:  SCDs BLE.  No significant edema.  No cyanosis Skin: No petechiae / purpura  Discharged Condition: good   Past Medical History  Diagnosis Date  . Diabetes mellitus without complication ([place])   . Hypertension   . Diverticulitis   . Kidney stones   . VAIN I (vaginal intraepithelial neoplasia grade I) 04/2012    Colposcopic biopsy  . PONV (postoperative nausea and vomiting)     emotional when awakens / cries  . History of bronchitis   . Hypothyroidism   . History of urinary tract infection   . Yeast infection     history of   . Hyperparathyroidism ([place])   . Fall   . Facial trauma     forehead,nose and upper lip secondary to fall     Past Surgical History  Procedure Laterality Date  . Colon surgery    . Cholecystectomy    . Carpal tunnel release      right and left   . Appendectomy    . Tubal ligation    . Hysteroscopy  2003  . Lavh  2004    leiomyoma/irregular bleeding  . Abdominal hysterectomy    . Colonscopy     . Dilation and curettage of uterus    . Parathyroidectomy N/A 02/21/2015    Procedure: RIGHT PARATHYROIDECTOMY;  Surgeon: [name], MD;  Location: [facility];  Service: General;  Laterality: N/A;    Social History   Social History  . Marital Status: Divorced    Spouse Name: N/A  . Number of Children: N/A  . Years of Education: N/A   Occupational History  . Not on file.   Social History Main Topics  . Smoking status: Never Smoker   . Smokeless tobacco: Never Used  . Alcohol Use: No  . Drug Use: No  . Sexual Activity: Yes    Birth Control/ Protection: Surgical     Comment: 1st intercourse 28 yo-5 partners   Other Topics  Concern  . Not on file   Social History Narrative    Family History  Problem Relation Age of Onset  . Heart attack Father   . Diabetes Brother     Current Facility-Administered Medications  Medication Dose Route Frequency Provider Last Rate Last Dose  . acetaminophen (TYLENOL) tablet 650 mg  650 mg Oral Q6H PRN [name], MD       Or  . acetaminophen (TYLENOL) suppository 650 mg  650 mg Rectal Q6H PRN [name], MD      . dextrose 5 % and 0.45 % NaCl with KCl 20 mEq/L infusion   Intravenous Continuous [name],  MD 50 mL/hr at 02/22/15 1037    . HYDROcodone-acetaminophen (NORCO/VICODIN) 5-325 MG per tablet 1-2 tablet  1-2 tablet Oral Q4H PRN [name], MD   1 tablet at 02/22/15 [phone]  . HYDROmorphone (DILAUDID) injection 1 mg  1 mg Intravenous Q2H PRN [name], MD      . hyoscyamine (LEVBID) 0.375 MG 12 hr tablet 0.375 mg  0.375 mg Oral Q12H PRN [name], MD      . insulin aspart (novoLOG) injection 0-15 Units  0-15 Units Subcutaneous TID WC [name], MD   5 Units at 02/22/15 1227  . insulin aspart (novoLOG) injection 0-5 Units  0-5 Units Subcutaneous QHS [name], MD   3 Units at 02/21/15 2206  . levothyroxine (SYNTHROID, LEVOTHROID) tablet 25 mcg  25 mcg Oral QAC breakfast [name], MD   25 mcg at 02/22/15 0748  . Liraglutide SOPN 1.8 mg  1.8 mg Subcutaneous q morning - 10a [name], MD   1.8 mg at 02/21/15 1200  . metFORMIN (GLUCOPHAGE-XR) 24 hr tablet 500 mg  500 mg Oral BID WC [name], MD   500 mg at 02/22/15 0748  . ondansetron (ZOFRAN-ODT) disintegrating tablet 4 mg  4 mg Oral Q6H PRN [name], MD       Or  . ondansetron (ZOFRAN) injection 4 mg  4 mg Intravenous Q6H PRN [name], MD      . pantoprazole (PROTONIX) EC tablet 40 mg  40 mg Oral Daily [name], MD   40 mg at 02/22/15 0918  . ramipril (ALTACE) capsule 5 mg  5 mg Oral Daily [name], MD   5 mg at 02/22/15 1638     Allergies  Allergen Reactions  . Canagliflozin     Other  reaction(s): yeast infection  . Empagliflozin     Other reaction(s): upset stomach  . Metformin Hcl     Other reaction(s): diarrhea  . Parlodel [Bromocriptine Mesylate] Hives    Disposition: 01-Home or Self Care     Medication List    TAKE these medications        HYDROcodone-acetaminophen 5-325 MG tablet  Commonly known as:  NORCO/VICODIN  Take 1-2 tablets by mouth every 4 (four) hours as needed for moderate pain.      ASK your doctor about these medications        aspirin 81 MG tablet  Take 81 mg by mouth every other day.     ibuprofen 800 MG tablet  Commonly known as:  ADVIL,MOTRIN  Take 1 tablet (800 mg total) by mouth 3 (three) times daily.     levothyroxine 25 MCG tablet  Commonly known as:  SYNTHROID, LEVOTHROID  Take 25 mcg by mouth every morning.     metFORMIN 500 MG (MOD) 24 hr tablet  Commonly known as:  GLUMETZA  Take 500 mg by mouth 2 (two) times daily.     ondansetron 4 MG tablet  Commonly known as:  ZOFRAN  Take 4 mg by mouth every 8 (eight) hours as needed for nausea or vomiting.     OSCIMIN SR 0.375 MG 12 hr tablet  Generic drug:  hyoscyamine  Take 0.375 mg by mouth every 12 (twelve) hours as needed for cramping.     pantoprazole 40 MG tablet  Commonly known as:  PROTONIX  Take 40 mg by mouth daily.     phentermine 37.5 MG capsule  Take 37.5 mg by mouth every morning.     ramipril 5 MG capsule  Commonly known as:  ALTACE  Take 5 mg by mouth daily.     VICTOZA 18 MG/3ML Sopn  Generic drug:  Liraglutide  Inject 1.8 mg into the skin every morning.     ZYRTEC ALLERGY PO  Take 1 tablet by mouth every morning.           Follow-up Information    Follow up with Earnstine Regal, MD. Schedule an appointment as soon as possible for a visit in 3 weeks.   Specialty:  General Surgery   Why:  For wound re-check   Contact information:   [place] [id number] [phone]        Signed: Morton Peters, M.D., F.A.C.S. Gastrointestinal and Minimally Invasive Surgery [facility] [address] [place], [place] [address] [phone] Main / Paging   02/22/2015, 12:46 PM

## 2015-02-22 NOTE — ED Provider Notes (Signed)
CSN: [id number]     Arrival date & time 02/22/15  2227 History  By signing my name below, I, Rowan Blase, attest that this documentation has been prepared under the direction and in the presence of Rolland Porter, MD at 2315. Electronically Signed: Rowan Blase, Scribe. 02/22/2015. 11:48 PM.   Chief Complaint  Patient presents with  . Shortness of Breath  . Hyperglycemia  . Fever   The history is provided by the patient. No language interpreter was used.   HPI Comments:  Alexis [name] is a 60 y.o. female who presents to the Emergency Department complaining of shortness of breath, fever and hyperclycemia ~500 onset ~4 hours ago. She reports the shortness of breath is currently alleviated. She reports associated mild abdominal soreness in her abdomen and thighs with palpation beginning around 1900 today. Pt reports associated cough productive of yellow sputum and blood, "scratcy" throat on the right side, urinary frequency, increased thirst, and mild achy front-headache. She relates she had the cough prior to her having her surgery. She reports having three shots of insulin  and 4 metformin pills with no relief of hyperglycemia today. Pt had surgery yesterday morning performed by Dr. Harlow Asa to remove a parathyroid tumor. Pt was discharged from the hospital earlier today. The surgury was prompted by elevated Ca levels and the tumor was indicated on a subsequent scan. Pt denies chills, nausea, vomiting, rhinorrhea, sneezing, dysuria, diarrhea, or difficulty swallowing . Pt notes she is wearing a nausea patch behind her ear. Pt denies smoking or ETOH use.   PCP Dr Ferd Hibbs Endocrinology Dr Buddy Duty Surgeon Dr Harlow Asa  Past Medical History  Diagnosis Date  . Diabetes mellitus without complication ([place])   . Hypertension   . Diverticulitis   . Kidney stones   . VAIN I (vaginal intraepithelial neoplasia grade I) 04/2012    Colposcopic biopsy  . PONV (postoperative nausea and vomiting)      emotional when awakens / cries  . History of bronchitis   . Hypothyroidism   . History of urinary tract infection   . Yeast infection     history of   . Hyperparathyroidism ([place])   . Fall   . Facial trauma     forehead,nose and upper lip secondary to fall    Past Surgical History  Procedure Laterality Date  . Colon surgery    . Cholecystectomy    . Carpal tunnel release      right and left   . Appendectomy    . Tubal ligation    . Hysteroscopy  2003  . Lavh  2004    leiomyoma/irregular bleeding  . Abdominal hysterectomy    . Colonscopy     . Dilation and curettage of uterus    . Parathyroidectomy N/A 02/21/2015    Procedure: RIGHT PARATHYROIDECTOMY;  Surgeon: [name], MD;  Location: [facility];  Service: General;  Laterality: N/A;   Family History  Problem Relation Age of Onset  . Heart attack Father   . Diabetes Brother    Social History  Substance Use Topics  . Smoking status: Never Smoker   . Smokeless tobacco: Never Used  . Alcohol Use: No   Lives at home  employed  OB History    Gravida Para Term Preterm AB TAB SAB Ectopic Multiple Living   3 2 2  1  1   2      Review of Systems  Constitutional: Positive for fever. Negative for chills.  HENT: Positive for sore  throat. Negative for rhinorrhea, sneezing and trouble swallowing.   Respiratory: Positive for cough and shortness of breath.   Gastrointestinal: Positive for abdominal pain. Negative for nausea, vomiting and diarrhea.  Genitourinary: Positive for frequency. Negative for dysuria.  Musculoskeletal: Positive for myalgias.  Neurological: Positive for headaches.   Allergies  Canagliflozin; Empagliflozin; Metformin hcl; and Parlodel  Home Medications   Prior to Admission medications   Medication Sig Start Date End Date Taking? Authorizing Provider  aspirin 81 MG tablet Take 81 mg by mouth every other day.    Yes Historical Provider, MD  Cetirizine HCl (ZYRTEC ALLERGY PO) Take 1 tablet by mouth every  morning.    Yes Historical Provider, MD  HYDROcodone-acetaminophen (NORCO/VICODIN) 5-325 MG tablet Take 1-2 tablets by mouth every 4 (four) hours as needed for moderate pain. 02/21/15  Yes [name], MD  ibuprofen (ADVIL,MOTRIN) 800 MG tablet Take 1 tablet (800 mg total) by mouth 3 (three) times daily. 01/09/15  Yes Maloy Lions, PA-C  levothyroxine (SYNTHROID, LEVOTHROID) 25 MCG tablet Take 25 mcg by mouth every morning.    Yes Historical Provider, MD  Liraglutide (VICTOZA) 18 MG/3ML SOPN Inject 1.8 mg into the skin every morning.    Yes Historical Provider, MD  metFORMIN (GLUMETZA) 500 MG (MOD) 24 hr tablet Take 500 mg by mouth 2 (two) times daily.    Yes Historical Provider, MD  ondansetron (ZOFRAN) 4 MG tablet Take 4 mg by mouth every 8 (eight) hours as needed for nausea or vomiting.   Yes Historical Provider, MD  pantoprazole (PROTONIX) 40 MG tablet Take 40 mg by mouth daily.   Yes Historical Provider, MD  ramipril (ALTACE) 5 MG capsule Take 5 mg by mouth daily.   Yes Historical Provider, MD   BP 113/50 mmHg  Pulse 64  Temp(Src) 98.9 F (37.2 C) (Oral)  Resp 19  Ht 5\' 4"  (1.626 m)  Wt 198 lb (89.812 kg)  BMI 33.97 kg/m2  SpO2 93%  Vital signs normal   Physical Exam  Constitutional: She is oriented to person, place, and time. She appears well-developed and well-nourished.  Non-toxic appearance. She does not appear ill. No distress.  HENT:  Head: Normocephalic and atraumatic.  Right Ear: External ear normal.  Left Ear: External ear normal.  Nose: Nose normal. No mucosal edema or rhinorrhea.  Mouth/Throat: Oropharynx is clear and moist and mucous membranes are normal. No dental abscesses or uvula swelling.  Eyes: Conjunctivae and EOM are normal. Pupils are equal, round, and reactive to light.  Neck: Normal range of motion and full passive range of motion without pain. Neck supple.  Clean incision on anterior lower neck with swelling and redness typical for recent surgery.   Cardiovascular: Normal rate, regular rhythm and normal heart sounds.  Exam reveals no gallop and no friction rub.   No murmur heard. Pulmonary/Chest: Effort normal and breath sounds normal. No respiratory distress. She has no wheezes. She has no rhonchi. She has no rales. She exhibits no tenderness and no crepitus.  Abdominal: Soft. Normal appearance and bowel sounds are normal. She exhibits no distension. There is no tenderness. There is no rebound and no guarding.  Mild diffuse lower abdominal discomfort to palpation.  Musculoskeletal: Normal range of motion. She exhibits no edema.  Moves all extremities well. Firmness and tenderness to palpation of anterior thighs BL  Neurological: She is alert and oriented to person, place, and time. She has normal strength. No cranial nerve deficit.  Skin: Skin is warm, dry  and intact. No rash noted. No erythema. No pallor.  Face is flushed.  Psychiatric: She has a normal mood and affect. Her speech is normal and behavior is normal. Her mood appears not anxious.  Nursing note and vitals reviewed.   ED Course  Procedures   Medications  azithromycin (ZITHROMAX) 500 mg in dextrose 5 % 250 mL IVPB (not administered)  sodium chloride 0.9 % bolus 1,000 mL (1,000 mLs Intravenous New Bag/Given 02/22/15 2343)  cefTRIAXone (ROCEPHIN) 1 g in dextrose 5 % 50 mL IVPB (1 g Intravenous New Bag/Given 02/23/15 0255)    DIAGNOSTIC STUDIES:  Oxygen Saturation is 94% on RA, adequate by my interpretation.    COORDINATION OF CARE:  11:09 PM Will order imaging and blood work. Discussed treatment plan with pt at bedside and pt agreed to plan.  After reviewing patient's x-ray she was started on Zithromax and Rocephin for community-acquired pneumonia. She was given a liter of IV fluids for dehydration.  2:45 AM Discussed imaging results and lab results with pt. Will consult with surgeons and discuss admission. Pt agreed to treatment plan. She states she has not been  eating or drinking well since her surgery although she states she does not have difficulty swallowing.  02:55 patient discussed with Dr. Lucia Gaskins, surgeon on call, he feels patient should be admitted by medicine, however he will see patient in the morning.  3:51 AM Dr. Gasper Lloyd, hospitalist will admit patient to med surgery.  Labs Review Results for orders placed or performed during the hospital encounter of [id number]  Basic metabolic panel  Result Value Ref Range   Sodium 135 135 - 145 mmol/L   Potassium 5.0 3.5 - 5.1 mmol/L   Chloride 103 101 - 111 mmol/L   CO2 22 22 - 32 mmol/L   Glucose, Bld 399 (H) 65 - 99 mg/dL   BUN 25 (H) 6 - 20 mg/dL   Creatinine, Ser 1.15 (H) 0.44 - 1.00 mg/dL   Calcium 9.3 8.9 - 10.3 mg/dL   GFR calc non Af Amer 51 (L) >60 mL/min   GFR calc Af Amer 59 (L) >60 mL/min   Anion gap 10 5 - 15  CBC  Result Value Ref Range   WBC 10.9 (H) 4.0 - 10.5 K/uL   RBC 4.51 3.87 - 5.11 MIL/uL   Hemoglobin 12.8 12.0 - 15.0 g/dL   HCT 39.9 36.0 - 46.0 %   MCV 88.5 78.0 - 100.0 fL   MCH 28.4 26.0 - 34.0 pg   MCHC 32.1 30.0 - 36.0 g/dL   RDW 14.1 11.5 - 15.5 %   Platelets 235 150 - 400 K/uL  Urinalysis, Routine w reflex microscopic (not at [facility])  Result Value Ref Range   Color, Urine AMBER (A) YELLOW   APPearance CLEAR CLEAR   Specific Gravity, Urine 1.039 (H) 1.005 - 1.030   pH 5.5 5.0 - 8.0   Glucose, UA >1000 (A) NEGATIVE mg/dL   Hgb urine dipstick NEGATIVE NEGATIVE   Bilirubin Urine NEGATIVE NEGATIVE   Ketones, ur NEGATIVE NEGATIVE mg/dL   Protein, ur NEGATIVE NEGATIVE mg/dL   Nitrite NEGATIVE NEGATIVE   Leukocytes, UA NEGATIVE NEGATIVE  Urine microscopic-add on  Result Value Ref Range   Squamous Epithelial / LPF 0-5 (A) NONE SEEN   WBC, UA 0-5 0 - 5 WBC/hpf   RBC / HPF 0-5 0 - 5 RBC/hpf   Bacteria, UA RARE (A) NONE SEEN  CBG monitoring, ED  Result Value Ref Range   Glucose-Capillary  358 (H) 65 - 99 mg/dL   Comment 1 Notify RN    Comment 2 Document in  Chart    Laboratory interpretation all normal except for leukocytosis, concentrated urine with glucosuria consistent with dehydration, hyperglycemia, and new renal insufficiency consistent with dehydration   Dg Chest 2 View  02/23/2015  CLINICAL DATA:  Acute onset of shortness of breath, fever and hyperglycemia. Cough. Initial encounter. EXAM: CHEST  2 VIEW COMPARISON:  Chest radiograph performed 01/20/2012 FINDINGS: The lungs are well-aerated. Mild left basilar opacity may reflect atelectasis or possibly mild pneumonia. A small left pleural effusion is suspected. There is no evidence of pneumothorax. The heart is mildly enlarged. No acute osseous abnormalities are seen. Clips are noted within the right upper quadrant, reflecting prior cholecystectomy. IMPRESSION: Mild left basilar airspace opacity may reflect atelectasis or possibly mild pneumonia. Small left pleural effusion suspected. Mild cardiomegaly. Electronically Signed   By: Garald Balding M.D.   On: 02/23/2015 00:20    MDM   Final diagnoses:  CAP (community acquired pneumonia)   Plan admission   Rolland Porter, MD, FACEP  I personally performed the services described in this documentation, which was scribed in my presence. The recorded information has been reviewed and considered.  Rolland Porter, MD, Barbette Or, MD 02/23/15 [phone]

## 2015-02-22 NOTE — Telephone Encounter (Signed)
Mrs. Knarr daughter Lynn Ito called, though I spoke with Ms. Yerke also. She had a parathyroidectomy on Friday, 2/10 by Dr. Harlow Asa and was discharged today.  She called because Ms. Grider felt sore all over and could not get her breath.  This did not occur until she got home. Though the daughter said that she was doing better now.  Her Ca++ was 9.5, but her glucose was high, 275, on this AM blood.  She had not eaten much since going home. They checked her blood sugars while I was on the phone with her.  Her glucose was 451.  Ms. Gentilini does not have stridor and sounded okay on the phone.  But with a significantly elevated blood sugar and her feeling like she could not catch her breath - I recommended that she go to the ER to be checked.  Alphonsa Overall, MD, [facility] Surgery Pager: [phone] Office phone:  [phone]

## 2015-02-22 NOTE — ED Notes (Signed)
Pt had parathyroid surgery yesterday 2/10. Pt was discharged this afternoon and returns with complaints of shortness of breath with exertion and pain when he pushes her abdomen and legs (the surgeon stated that that was likely due to the anesthesia).

## 2015-02-22 NOTE — Progress Notes (Signed)
Nurse reviewed discharge instructions with pt.  Pt verbalized understanding of discharge instructions, follow up appointment and new medications.  Prescription given to pt prior to discharge.  No concerns at time of discharge. 

## 2015-02-23 DIAGNOSIS — Z7982 Long term (current) use of aspirin: Secondary | ICD-10-CM | POA: Diagnosis not present

## 2015-02-23 DIAGNOSIS — E0810 Diabetes mellitus due to underlying condition with ketoacidosis without coma: Secondary | ICD-10-CM | POA: Diagnosis not present

## 2015-02-23 DIAGNOSIS — I10 Essential (primary) hypertension: Secondary | ICD-10-CM | POA: Diagnosis not present

## 2015-02-23 DIAGNOSIS — Z79899 Other long term (current) drug therapy: Secondary | ICD-10-CM | POA: Diagnosis not present

## 2015-02-23 DIAGNOSIS — Z791 Long term (current) use of non-steroidal anti-inflammatories (NSAID): Secondary | ICD-10-CM | POA: Diagnosis not present

## 2015-02-23 DIAGNOSIS — J189 Pneumonia, unspecified organism: Secondary | ICD-10-CM | POA: Diagnosis not present

## 2015-02-23 DIAGNOSIS — Z7984 Long term (current) use of oral hypoglycemic drugs: Secondary | ICD-10-CM | POA: Diagnosis not present

## 2015-02-23 DIAGNOSIS — K219 Gastro-esophageal reflux disease without esophagitis: Secondary | ICD-10-CM | POA: Diagnosis not present

## 2015-02-23 DIAGNOSIS — E7800 Pure hypercholesterolemia, unspecified: Secondary | ICD-10-CM | POA: Diagnosis not present

## 2015-02-23 DIAGNOSIS — R0602 Shortness of breath: Secondary | ICD-10-CM | POA: Diagnosis not present

## 2015-02-23 DIAGNOSIS — E119 Type 2 diabetes mellitus without complications: Secondary | ICD-10-CM | POA: Diagnosis not present

## 2015-02-23 DIAGNOSIS — R05 Cough: Secondary | ICD-10-CM | POA: Diagnosis not present

## 2015-02-23 DIAGNOSIS — R509 Fever, unspecified: Secondary | ICD-10-CM | POA: Diagnosis not present

## 2015-02-23 DIAGNOSIS — E210 Primary hyperparathyroidism: Secondary | ICD-10-CM

## 2015-02-23 DIAGNOSIS — E785 Hyperlipidemia, unspecified: Secondary | ICD-10-CM | POA: Diagnosis not present

## 2015-02-23 LAB — BASIC METABOLIC PANEL
Anion gap: 7 (ref 5–15)
BUN: 20 mg/dL (ref 6–20)
CO2: 23 mmol/L (ref 22–32)
Calcium: 8.2 mg/dL — ABNORMAL LOW (ref 8.9–10.3)
Chloride: 105 mmol/L (ref 101–111)
Creatinine, Ser: 0.85 mg/dL (ref 0.44–1.00)
GFR calc Af Amer: >60 mL/min (ref >60)
GFR calc non Af Amer: >60 mL/min (ref >60)
Glucose, Bld: 223 mg/dL — ABNORMAL HIGH (ref 65–99)
Potassium: 4.2 mmol/L (ref 3.5–5.1)
Sodium: 135 mmol/L (ref 135–145)

## 2015-02-23 LAB — URINE MICROSCOPIC-ADD ON

## 2015-02-23 LAB — CBC
HCT: 37.9 % (ref 36.0–46.0)
Hemoglobin: 12.2 g/dL (ref 12.0–15.0)
MCH: 29 pg (ref 26.0–34.0)
MCHC: 32.2 g/dL (ref 30.0–36.0)
MCV: 90.2 fL (ref 78.0–100.0)
Platelets: 206 10*3/uL (ref 150–400)
RBC: 4.2 MIL/uL (ref 3.87–5.11)
RDW: 14.5 % (ref 11.5–15.5)
WBC: 8.1 10*3/uL (ref 4.0–10.5)

## 2015-02-23 LAB — CBG MONITORING, ED: Glucose-Capillary: 192 mg/dL — ABNORMAL HIGH (ref 65–99)

## 2015-02-23 LAB — GLUCOSE, CAPILLARY
Glucose-Capillary: 265 mg/dL — ABNORMAL HIGH (ref 65–99)
Glucose-Capillary: 340 mg/dL — ABNORMAL HIGH (ref 65–99)
Glucose-Capillary: 218 mg/dL — ABNORMAL HIGH (ref 65–99)
Glucose-Capillary: 247 mg/dL — ABNORMAL HIGH (ref 65–99)

## 2015-02-23 LAB — URINALYSIS, ROUTINE W REFLEX MICROSCOPIC
Bilirubin Urine: NEGATIVE
Glucose, UA: >1000 mg/dL — AB
Hgb urine dipstick: NEGATIVE
Ketones, ur: NEGATIVE mg/dL
Leukocytes, UA: NEGATIVE
Nitrite: NEGATIVE
Protein, ur: NEGATIVE mg/dL
Specific Gravity, Urine: 1.039 — ABNORMAL HIGH (ref 1.005–1.030)
pH: 5.5 (ref 5.0–8.0)

## 2015-02-23 LAB — STREP PNEUMONIAE URINARY ANTIGEN: Strep Pneumo Urinary Antigen: NEGATIVE

## 2015-02-23 MED ORDER — DEXTROSE 5 % IV SOLN
1.0000 g | INTRAVENOUS | Status: DC
  Administered 2015-02-24: 1 g via INTRAVENOUS
  Filled 2015-02-23: qty 10

## 2015-02-23 MED ORDER — DEXTROSE 5 % IV SOLN
1.0000 g | Freq: Once | INTRAVENOUS | Status: AC
  Administered 2015-02-23: 1 g via INTRAVENOUS
  Filled 2015-02-23: qty 10

## 2015-02-23 MED ORDER — DEXTROSE 5 % IV SOLN
500.0000 mg | Freq: Once | INTRAVENOUS | Status: DC
  Filled 2015-02-23: qty 500

## 2015-02-23 MED ORDER — ONDANSETRON HCL 4 MG PO TABS: 4.0000 mg | ORAL_TABLET | Freq: Four times a day (QID) | ORAL | Status: DC | PRN

## 2015-02-23 MED ORDER — ALBUTEROL SULFATE (2.5 MG/3ML) 0.083% IN NEBU
2.5000 mg | INHALATION_SOLUTION | Freq: Two times a day (BID) | RESPIRATORY_TRACT | Status: DC
  Administered 2015-02-23 – 2015-02-24 (×2): 2.5 mg via RESPIRATORY_TRACT
  Filled 2015-02-23 (×2): qty 3

## 2015-02-23 MED ORDER — INSULIN ASPART 100 UNIT/ML ~~LOC~~ SOLN
0.0000 [IU] | Freq: Every day | SUBCUTANEOUS | Status: DC
  Administered 2015-02-23: 2 [IU] via SUBCUTANEOUS

## 2015-02-23 MED ORDER — ONDANSETRON HCL 4 MG/2ML IJ SOLN: 4.0000 mg | Freq: Four times a day (QID) | INTRAMUSCULAR | Status: DC | PRN

## 2015-02-23 MED ORDER — ALUM & MAG HYDROXIDE-SIMETH 200-200-20 MG/5ML PO SUSP: 30.0000 mL | Freq: Four times a day (QID) | ORAL | Status: DC | PRN

## 2015-02-23 MED ORDER — ACETAMINOPHEN 325 MG PO TABS: 650.0000 mg | ORAL_TABLET | Freq: Four times a day (QID) | ORAL | Status: DC | PRN

## 2015-02-23 MED ORDER — SODIUM CHLORIDE 0.9% FLUSH: 3.0000 mL | INTRAVENOUS | Status: DC | PRN

## 2015-02-23 MED ORDER — SODIUM CHLORIDE 0.9% FLUSH: 3.0000 mL | Freq: Two times a day (BID) | INTRAVENOUS | Status: DC

## 2015-02-23 MED ORDER — SODIUM CHLORIDE 0.9 % IV SOLN: 250.0000 mL | INTRAVENOUS | Status: DC | PRN

## 2015-02-23 MED ORDER — PANTOPRAZOLE SODIUM 40 MG PO TBEC
40.0000 mg | DELAYED_RELEASE_TABLET | Freq: Every day | ORAL | Status: DC
  Administered 2015-02-23 – 2015-02-24 (×2): 40 mg via ORAL
  Filled 2015-02-23 (×2): qty 1

## 2015-02-23 MED ORDER — ALBUTEROL SULFATE (2.5 MG/3ML) 0.083% IN NEBU
2.5000 mg | INHALATION_SOLUTION | Freq: Four times a day (QID) | RESPIRATORY_TRACT | Status: DC
  Administered 2015-02-23: 2.5 mg via RESPIRATORY_TRACT
  Filled 2015-02-23: qty 3

## 2015-02-23 MED ORDER — HYDROMORPHONE HCL 1 MG/ML IJ SOLN: 0.5000 mg | INTRAMUSCULAR | Status: DC | PRN

## 2015-02-23 MED ORDER — ACETAMINOPHEN 650 MG RE SUPP: 650.0000 mg | Freq: Four times a day (QID) | RECTAL | Status: DC | PRN

## 2015-02-23 MED ORDER — OXYCODONE HCL 5 MG PO TABS: 5.0000 mg | ORAL_TABLET | ORAL | Status: DC | PRN

## 2015-02-23 MED ORDER — AZITHROMYCIN 500 MG IV SOLR
500.0000 mg | INTRAVENOUS | Status: DC
  Administered 2015-02-24: 500 mg via INTRAVENOUS
  Filled 2015-02-23: qty 500

## 2015-02-23 MED ORDER — ASPIRIN 81 MG PO CHEW
81.0000 mg | CHEWABLE_TABLET | ORAL | Status: DC
  Administered 2015-02-23: 81 mg via ORAL
  Filled 2015-02-23: qty 1

## 2015-02-23 MED ORDER — INSULIN ASPART 100 UNIT/ML ~~LOC~~ SOLN
0.0000 [IU] | Freq: Three times a day (TID) | SUBCUTANEOUS | Status: DC
  Administered 2015-02-23: 7 [IU] via SUBCUTANEOUS
  Administered 2015-02-23: 3 [IU] via SUBCUTANEOUS
  Administered 2015-02-23: 5 [IU] via SUBCUTANEOUS
  Administered 2015-02-24 (×2): 3 [IU] via SUBCUTANEOUS

## 2015-02-23 MED ORDER — LIP MEDEX EX OINT
TOPICAL_OINTMENT | CUTANEOUS | Status: DC | PRN
  Filled 2015-02-23: qty 7

## 2015-02-23 MED ORDER — CETIRIZINE HCL 10 MG PO TABS
10.0000 mg | ORAL_TABLET | Freq: Every day | ORAL | Status: DC
  Administered 2015-02-23 – 2015-02-24 (×2): 10 mg via ORAL
  Filled 2015-02-23 (×2): qty 1

## 2015-02-23 MED ORDER — ENOXAPARIN SODIUM 40 MG/0.4ML ~~LOC~~ SOLN
40.0000 mg | SUBCUTANEOUS | Status: DC
  Administered 2015-02-23 – 2015-02-24 (×2): 40 mg via SUBCUTANEOUS
  Filled 2015-02-23 (×2): qty 0.4

## 2015-02-23 MED ORDER — RAMIPRIL 5 MG PO CAPS
5.0000 mg | ORAL_CAPSULE | Freq: Every day | ORAL | Status: DC
  Filled 2015-02-23 (×2): qty 1

## 2015-02-23 MED ORDER — LEVOTHYROXINE SODIUM 25 MCG PO TABS
25.0000 ug | ORAL_TABLET | Freq: Every day | ORAL | Status: DC
  Administered 2015-02-23 – 2015-02-24 (×2): 25 ug via ORAL
  Filled 2015-02-23 (×3): qty 1

## 2015-02-23 NOTE — H&P (Signed)
[facility] Admission History and Physical       Alexis [name] [id number] DOB: May 29, 1955 DOA: 02/22/2015  Referring physician: EDP PCP: [name], DO  Specialists:   Chief Complaint: SOB and Cough   HPI: Alexis [name] is a 60 y.o. female with a history of DM2, HTN, Primary Hyperparathyroid S/P Parathyroid Surgery on 02/21/2015 who presents to the ED with complaints of SOB and Cough and Fevers and Chills x 2 days.   A Chest X-ray revealed Left Lung Airspace Disease.   She was placed on antibiotic Rx of IV Rocephin and Azithromycin for CAP Pneumonia and Referred for Admission.      Review of Systems:  Constitutional: No Weight Loss, No Weight Gain, Night Sweats, Fevers, Chills, Dizziness, Light Headedness, Fatigue, or Generalized Weakness HEENT: No Headaches, Difficulty Swallowing,Tooth/Dental Problems,Sore Throat,  No Sneezing, Rhinitis, Ear Ache, Nasal Congestion, or Post Nasal Drip,  Cardio-vascular:  No Chest pain, Orthopnea, PND, Edema in Lower Extremities, Anasarca, Dizziness, Palpitations  Resp:  +Dyspnea, No DOE, +Productive Cough, No Hemoptysis, No Wheezing.    GI: No Heartburn, Indigestion, Abdominal Pain, Nausea, Vomiting, Diarrhea, Constipation, Hematemesis, Hematochezia, Melena, Change in Bowel Habits,  Loss of Appetite  GU: No Dysuria, No Change in Color of Urine, No Urgency or Urinary Frequency, No Flank pain.  Musculoskeletal: No Joint Pain or Swelling, No Decreased Range of Motion, No Back Pain.  Neurologic: No Syncope, No Seizures, Muscle Weakness, Paresthesia, Vision Disturbance or Loss, No Diplopia, No Vertigo, No Difficulty Walking,  Skin: No Rash or Lesions. Psych: No Change in Mood or Affect, No Depression or Anxiety, No Memory loss, No Confusion, or Hallucinations   Past Medical History  Diagnosis Date  . Diabetes mellitus without complication ([place])   . Hypertension   . Diverticulitis   . Kidney stones   . VAIN I (vaginal  intraepithelial neoplasia grade I) 04/2012    Colposcopic biopsy  . PONV (postoperative nausea and vomiting)     emotional when awakens / cries  . History of bronchitis   . Hypothyroidism   . History of urinary tract infection   . Yeast infection     history of   . Hyperparathyroidism ([place])   . Fall   . Facial trauma     forehead,nose and upper lip secondary to fall      Past Surgical History  Procedure Laterality Date  . Colon surgery    . Cholecystectomy    . Carpal tunnel release      right and left   . Appendectomy    . Tubal ligation    . Hysteroscopy  2003  . Lavh  2004    leiomyoma/irregular bleeding  . Abdominal hysterectomy    . Colonscopy     . Dilation and curettage of uterus    . Parathyroidectomy N/A 02/21/2015    Procedure: RIGHT PARATHYROIDECTOMY;  Surgeon: [name], MD;  Location: [facility];  Service: General;  Laterality: N/A;      Prior to Admission medications   Medication Sig Start Date End Date Taking? Authorizing Provider  aspirin 81 MG tablet Take 81 mg by mouth every other day.    Yes Historical Provider, MD  Cetirizine HCl (ZYRTEC ALLERGY PO) Take 1 tablet by mouth every morning.    Yes Historical Provider, MD  HYDROcodone-acetaminophen (NORCO/VICODIN) 5-325 MG tablet Take 1-2 tablets by mouth every 4 (four) hours as needed for moderate pain. 02/21/15  Yes [name], MD  ibuprofen (ADVIL,MOTRIN) 800 MG tablet Take  1 tablet (800 mg total) by mouth 3 (three) times daily. 01/09/15  Yes Inyo Lions, PA-C  levothyroxine (SYNTHROID, LEVOTHROID) 25 MCG tablet Take 25 mcg by mouth every morning.    Yes Historical Provider, MD  Liraglutide (VICTOZA) 18 MG/3ML SOPN Inject 1.8 mg into the skin every morning.    Yes Historical Provider, MD  metFORMIN (GLUMETZA) 500 MG (MOD) 24 hr tablet Take 500 mg by mouth 2 (two) times daily.    Yes Historical Provider, MD  ondansetron (ZOFRAN) 4 MG tablet Take 4 mg by mouth every 8 (eight) hours as needed for  nausea or vomiting.   Yes Historical Provider, MD  pantoprazole (PROTONIX) 40 MG tablet Take 40 mg by mouth daily.   Yes Historical Provider, MD  ramipril (ALTACE) 5 MG capsule Take 5 mg by mouth daily.   Yes Historical Provider, MD     Allergies  Allergen Reactions  . Canagliflozin     Other reaction(s): yeast infection  . Empagliflozin     Other reaction(s): upset stomach  . Metformin Hcl     Other reaction(s): diarrhea  . Parlodel [Bromocriptine Mesylate] Hives    Social History:  reports that she has never smoked. She has never used smokeless tobacco. She reports that she does not drink alcohol or use illicit drugs.    Family History  Problem Relation Age of Onset  . Heart attack Father   . Diabetes Brother        Physical Exam:  GEN:  Pleasant Obese 60 y.o. Caucasian female examined and in no acute distress; cooperative with exam Filed Vitals:   02/22/15 2227 02/22/15 2237 02/23/15 0109  BP:   113/50  Pulse:  80 64  Temp:  98.9 F (37.2 C)   TempSrc:  Oral   Resp:  18 19  Height:  5\' 4"  (1.626 m)   Weight:  89.812 kg (198 lb)   SpO2: 98% 94% 93%   Blood pressure 113/50, pulse 64, temperature 98.9 F (37.2 C), temperature source Oral, resp. rate 19, height 5\' 4"  (1.626 m), weight 89.812 kg (198 lb), SpO2 93 %. PSYCH: She is alert and oriented x4; does not appear anxious does not appear depressed; affect is normal HEENT: Normocephalic and Atraumatic, Mucous membranes pink; PERRLA; EOM intact; Fundi:  Benign;  No scleral icterus, Nares: Patent, Oropharynx: Clear,  Fair Dentition,    Neck: Surgical wound apposed with Steri-Strips No signs of Infection    FROM, No Cervical Lymphadenopathy nor Thyromegaly or Carotid Bruit; No JVD; Breasts:: Not examined CHEST WALL: No tenderness CHEST: Normal respiration, clear to auscultation bilaterally HEART: Regular rate and rhythm; no murmurs rubs or gallops BACK: No kyphosis or scoliosis; No CVA tenderness ABDOMEN: Positive  Bowel Sounds, Scaphoid, Obese, Soft Non-Tender, No Rebound or Guarding; No Masses, No Organomegaly, No Pannus; No Intertriginous candida. Rectal Exam: Not done EXTREMITIES: No Cyanosis, Clubbing, or Edema; No Ulcerations. Genitalia: not examined PULSES: 2+ and symmetric SKIN: Normal hydration no rash or ulceration CNS:  Alert and Oriented x 4, No Focal Deficits Vascular: pulses palpable throughout    Labs on Admission:  Basic Metabolic Panel:  Recent Labs Lab 02/22/15 0441 02/22/15 2250  NA 135 135  K 4.8 5.0  CL 102 103  CO2 24 22  GLUCOSE 275* 399*  BUN 15 25*  CREATININE 0.80 1.15*  CALCIUM 9.3 9.3   Liver Function Tests: No results for input(s): AST, ALT, ALKPHOS, BILITOT, PROT, ALBUMIN in the last 168 hours. No results for  input(s): LIPASE, AMYLASE in the last 168 hours. No results for input(s): AMMONIA in the last 168 hours. CBC:  Recent Labs Lab 02/22/15 2250  WBC 10.9*  HGB 12.8  HCT 39.9  MCV 88.5  PLT 235   Cardiac Enzymes: No results for input(s): CKTOTAL, CKMB, CKMBINDEX, TROPONINI in the last 168 hours.  BNP (last 3 results) No results for input(s): BNP in the last 8760 hours.  ProBNP (last 3 results) No results for input(s): PROBNP in the last 8760 hours.  CBG:  Recent Labs Lab 02/21/15 1721 02/21/15 2151 02/22/15 0734 02/22/15 1147 02/22/15 2257  GLUCAP 314* 284* 241* 232* 358*    Radiological Exams on Admission: Dg Chest 2 View  02/23/2015  CLINICAL DATA:  Acute onset of shortness of breath, fever and hyperglycemia. Cough. Initial encounter. EXAM: CHEST  2 VIEW COMPARISON:  Chest radiograph performed 01/20/2012 FINDINGS: The lungs are well-aerated. Mild left basilar opacity may reflect atelectasis or possibly mild pneumonia. A small left pleural effusion is suspected. There is no evidence of pneumothorax. The heart is mildly enlarged. No acute osseous abnormalities are seen. Clips are noted within the right upper quadrant, reflecting  prior cholecystectomy. IMPRESSION: Mild left basilar airspace opacity may reflect atelectasis or possibly mild pneumonia. Small left pleural effusion suspected. Mild cardiomegaly. Electronically Signed   By: Garald Balding M.D.   On: 02/23/2015 00:20        Assessment/Plan:      60 y.o. female with  Principal Problem:    1.     CAP (community acquired pneumonia)    IV Rocephin and Azithromycin    Albuterol Nebs   Active Problems:    2.     Diabetes ([place])    SSI coverage PRN    Hold Metformin Rx      3.     Hyperlipidemia    Not on Rx at this time      4.     Essential hypertension    Continue Ramipril Rx    Monitor BPs      5.     Hyperparathyroidism, primary ([place])- S/P Surgery    Monitor Calcium levels    CCS surgery to see in AM     Wound Care per surgery      6.     DVT Prophylaxis    Lovenox     Code Status:     FULL CODE      Family Communication:    No Family Present    Disposition Plan:    Inpatient Status  With Expected LOS 2-3 days   Time spent:  [facility] Pager [phone]   If [place] Please Contact the Day Rounding Team MD for [facility]  If 7PM-7AM, Please Contact Night-Floor Coverage  [email or web address] Password [email or web address] 02/23/2015, 4:10 AM     ADDENDUM:   Patient was seen and examined on 02/23/2015

## 2015-02-23 NOTE — Progress Notes (Signed)
[address] [place], [place] [address] Phone: [phone] FAX: [phone]   Alexis [name] [id number] 11-20-1955   Assessment  Problem List:   Principal Problem:   CAP (community acquired pneumonia) Active Problems:   Diabetes ([place])   Hyperlipidemia   Essential hypertension   Primary hyperparathyroidism ([place])      POST-OPERATIVE DIAGNOSIS:   Primary hyperparathyroidism  SURGERY: 02/21/2015  Procedure(s): RIGHT PARATHYROIDECTOMY  SURGEON:   Surgeon(s): [name], MD     Better  Plan:  -calcium WNL - follow -DM control per IM -VTE prophylaxis- SCDs, etc -mobilize as tolerated to help recovery  Adin Hector, M.D., F.A.C.S. Gastrointestinal and Minimally Invasive Surgery [facility] [address] [place], [place] [address] [phone] Main / Paging   02/23/2015  Subjective:  Bounced back with worsening cough, shakes & glc >400 Feels beter now No n/v Swallowing fine No prob w speech No cramping/weakness now Sleeping better  Objective:  Vital signs:  Filed Vitals:   02/23/15 0441 02/23/15 0519 02/23/15 0807 02/23/15 0844  BP: 106/48 105/38  115/40  Pulse: 61 62  90  Temp: 98.4 F (36.9 C) 98.2 F (36.8 C)  98.8 F (37.1 C)  TempSrc: Oral Oral  Oral  Resp: '19 17  18  '$ Height:      Weight:      SpO2: 94% 93% 94% 94%    Last BM Date: 02/22/15  Intake/Output   Yesterday:    This shift:     Bowel function:  Flatus: y  BM: n  Drain: n/a  Physical Exam:  General: Pt awake/alert/oriented x4 in no major acute distress Eyes: PERRL, normal EOM. Sclera nonicteric Neuro: CN II-XII intact w/o focal sensory/motor deficits. Lymph: No head/neck/groin lymphadenopathy Psych: No delerium/psychosis/paranoia HENT: Normocephalic, Mucus membranes moist. No thrush Neck: Supple, No tracheal deviation. Incision clean & dry.  Normal healing ridge. Talking normally w/o hoarseness. No problems with swallowing/speaking. Chest: No pain. Good respiratory excursion.  Mild dec BS at bases.  No w/r/r CV: Pulses intact. Regular rhythm MS: Normal AROM mjr joints. No obvious deformity Abdomen: Soft, Nondistended. Nontender. No incarcerated hernias. Ext: SCDs BLE. No significant edema. No cyanosis Skin: No petechiae / purpura  Results:   Labs: Results for orders placed or performed during the hospital encounter of 02/22/15 (from the past 48 hour(s))  Basic metabolic panel     Status: Abnormal   Collection Time: 02/22/15 10:50 PM  Result Value Ref Range   Sodium 135 135 - 145 mmol/L   Potassium 5.0 3.5 - 5.1 mmol/L   Chloride 103 101 - 111 mmol/L   CO2 22 22 - 32 mmol/L   Glucose, Bld 399 (H) 65 - 99 mg/dL   BUN 25 (H) 6 - 20 mg/dL   Creatinine, Ser 1.15 (H) 0.44 - 1.00 mg/dL   Calcium 9.3 8.9 - 10.3 mg/dL   GFR calc non Af Amer 51 (L) >60 mL/min   GFR calc Af Amer 59 (L) >60 mL/min    Comment: (NOTE) The eGFR has been calculated using the CKD EPI equation. This calculation has not been validated in all clinical situations. eGFR's persistently <60 mL/min signify possible Chronic Kidney Disease.    Anion gap 10 5 - 15  CBC     Status: Abnormal   Collection Time: 02/22/15 10:50 PM  Result Value Ref Range   WBC 10.9 (H) 4.0 - 10.5 K/uL   RBC 4.51 3.87 -  5.11 MIL/uL   Hemoglobin 12.8 12.0 - 15.0 g/dL   HCT 39.9 36.0 - 46.0 %   MCV 88.5 78.0 - 100.0 fL   MCH 28.4 26.0 - 34.0 pg   MCHC 32.1 30.0 - 36.0 g/dL   RDW 14.1 11.5 - 15.5 %   Platelets 235 150 - 400 K/uL  CBG monitoring, ED     Status: Abnormal   Collection Time: 02/22/15 10:57 PM  Result Value Ref Range   Glucose-Capillary 358 (H) 65 - 99 mg/dL   Comment 1 Notify RN    Comment 2 Document in Chart   Urinalysis, Routine w reflex microscopic (not at [facility])     Status: Abnormal   Collection Time: 02/23/15  1:17 AM  Result Value Ref Range    Color, Urine AMBER (A) YELLOW    Comment: BIOCHEMICALS MAY BE AFFECTED BY COLOR   APPearance CLEAR CLEAR   Specific Gravity, Urine 1.039 (H) 1.005 - 1.030   pH 5.5 5.0 - 8.0   Glucose, UA >1000 (A) NEGATIVE mg/dL   Hgb urine dipstick NEGATIVE NEGATIVE   Bilirubin Urine NEGATIVE NEGATIVE   Ketones, ur NEGATIVE NEGATIVE mg/dL   Protein, ur NEGATIVE NEGATIVE mg/dL   Nitrite NEGATIVE NEGATIVE   Leukocytes, UA NEGATIVE NEGATIVE  Urine microscopic-add on     Status: Abnormal   Collection Time: 02/23/15  1:17 AM  Result Value Ref Range   Squamous Epithelial / LPF 0-5 (A) NONE SEEN   WBC, UA 0-5 0 - 5 WBC/hpf   RBC / HPF 0-5 0 - 5 RBC/hpf   Bacteria, UA RARE (A) NONE SEEN  CBG monitoring, ED     Status: Abnormal   Collection Time: 02/23/15  4:45 AM  Result Value Ref Range   Glucose-Capillary 192 (H) 65 - 99 mg/dL   Comment 1 Notify RN    Comment 2 Document in Chart   Basic metabolic panel     Status: Abnormal   Collection Time: 02/23/15  6:14 AM  Result Value Ref Range   Sodium 135 135 - 145 mmol/L   Potassium 4.2 3.5 - 5.1 mmol/L   Chloride 105 101 - 111 mmol/L   CO2 23 22 - 32 mmol/L   Glucose, Bld 223 (H) 65 - 99 mg/dL   BUN 20 6 - 20 mg/dL   Creatinine, Ser 0.85 0.44 - 1.00 mg/dL   Calcium 8.2 (L) 8.9 - 10.3 mg/dL   GFR calc non Af Amer >60 >60 mL/min   GFR calc Af Amer >60 >60 mL/min    Comment: (NOTE) The eGFR has been calculated using the CKD EPI equation. This calculation has not been validated in all clinical situations. eGFR's persistently <60 mL/min signify possible Chronic Kidney Disease.    Anion gap 7 5 - 15  CBC     Status: None   Collection Time: 02/23/15  6:14 AM  Result Value Ref Range   WBC 8.1 4.0 - 10.5 K/uL   RBC 4.20 3.87 - 5.11 MIL/uL   Hemoglobin 12.2 12.0 - 15.0 g/dL   HCT 37.9 36.0 - 46.0 %   MCV 90.2 78.0 - 100.0 fL   MCH 29.0 26.0 - 34.0 pg   MCHC 32.2 30.0 - 36.0 g/dL   RDW 14.5 11.5 - 15.5 %   Platelets 206 150 - 400 K/uL  Glucose,  capillary     Status: Abnormal   Collection Time: 02/23/15  8:20 AM  Result Value Ref Range   Glucose-Capillary 265 (H) 65 -  99 mg/dL    Imaging / Studies: Dg Chest 2 View  02/23/2015  CLINICAL DATA:  Acute onset of shortness of breath, fever and hyperglycemia. Cough. Initial encounter. EXAM: CHEST  2 VIEW COMPARISON:  Chest radiograph performed 01/20/2012 FINDINGS: The lungs are well-aerated. Mild left basilar opacity may reflect atelectasis or possibly mild pneumonia. A small left pleural effusion is suspected. There is no evidence of pneumothorax. The heart is mildly enlarged. No acute osseous abnormalities are seen. Clips are noted within the right upper quadrant, reflecting prior cholecystectomy. IMPRESSION: Mild left basilar airspace opacity may reflect atelectasis or possibly mild pneumonia. Small left pleural effusion suspected. Mild cardiomegaly. Electronically Signed   By: Garald Balding M.D.   On: 02/23/2015 00:20    Medications / Allergies: per chart  Antibiotics: Anti-infectives    Start     Dose/Rate Route Frequency Ordered Stop   02/24/15 0600  azithromycin (ZITHROMAX) 500 mg in dextrose 5 % 250 mL IVPB     500 mg 250 mL/hr over 60 Minutes Intravenous Every 24 hours 02/23/15 0526 03/03/15 0559   02/24/15 0300  cefTRIAXone (ROCEPHIN) 1 g in dextrose 5 % 50 mL IVPB     1 g 100 mL/hr over 30 Minutes Intravenous Every 24 hours 02/23/15 0526 03/03/15 0259   02/23/15 0230  cefTRIAXone (ROCEPHIN) 1 g in dextrose 5 % 50 mL IVPB     1 g 100 mL/hr over 30 Minutes Intravenous  Once 02/23/15 0226 02/23/15 0435   02/23/15 0230  azithromycin (ZITHROMAX) 500 mg in dextrose 5 % 250 mL IVPB     500 mg 250 mL/hr over 60 Minutes Intravenous  Once 02/23/15 0226          Note: Portions of this report may have been transcribed using voice recognition software. Every effort was made to ensure accuracy; however, inadvertent computerized transcription errors may be present.   Any  transcriptional errors that result from this process are unintentional.     Adin Hector, M.D., F.A.C.S. Gastrointestinal and Minimally Invasive Surgery [facility] [address] [place] [address] [place], [place] [address] [phone] Main / Paging   02/23/2015  CARE TEAM:  PCP: [name], DO  Outpatient Care Team: Patient Care Team: [name], DO as PCP - [place], [facility] as [facility] (Pharmacist) [name], MD as Consulting Physician (General Surgery)  Inpatient Treatment Team: Treatment Team: Attending Provider: Eugenie Filler, MD; Technician: Kennedy Bucker, NT; Rounding Team: Redmond Baseman, MD; Consulting Physician: Nolon Nations, MD; Registered Nurse: Virginia Rochester, RN; Consulting Physician: [name], MD

## 2015-02-23 NOTE — Progress Notes (Signed)
I have seen and assessed patient and agree with Dr Adline Mango assessment and plan.

## 2015-02-23 NOTE — Progress Notes (Signed)
This shift, pt arrived to unit room 1518 via stretcher. Pt walked to bed with steady gait. VS taken and pt oriented to room and callbell with no complications. Pt guide at the bedside. General weakness, 0/10 pain. Initial assessment completed. Will continue to monitor throughout shift.

## 2015-02-24 DIAGNOSIS — E119 Type 2 diabetes mellitus without complications: Secondary | ICD-10-CM | POA: Diagnosis not present

## 2015-02-24 DIAGNOSIS — J189 Pneumonia, unspecified organism: Secondary | ICD-10-CM | POA: Diagnosis not present

## 2015-02-24 DIAGNOSIS — Z7984 Long term (current) use of oral hypoglycemic drugs: Secondary | ICD-10-CM | POA: Diagnosis not present

## 2015-02-24 DIAGNOSIS — E7800 Pure hypercholesterolemia, unspecified: Secondary | ICD-10-CM | POA: Diagnosis not present

## 2015-02-24 DIAGNOSIS — Z7982 Long term (current) use of aspirin: Secondary | ICD-10-CM | POA: Diagnosis not present

## 2015-02-24 DIAGNOSIS — E785 Hyperlipidemia, unspecified: Secondary | ICD-10-CM

## 2015-02-24 DIAGNOSIS — E210 Primary hyperparathyroidism: Secondary | ICD-10-CM | POA: Diagnosis not present

## 2015-02-24 DIAGNOSIS — Z791 Long term (current) use of non-steroidal anti-inflammatories (NSAID): Secondary | ICD-10-CM | POA: Diagnosis not present

## 2015-02-24 DIAGNOSIS — K219 Gastro-esophageal reflux disease without esophagitis: Secondary | ICD-10-CM | POA: Diagnosis not present

## 2015-02-24 DIAGNOSIS — I10 Essential (primary) hypertension: Secondary | ICD-10-CM | POA: Diagnosis not present

## 2015-02-24 DIAGNOSIS — Z79899 Other long term (current) drug therapy: Secondary | ICD-10-CM | POA: Diagnosis not present

## 2015-02-24 LAB — BASIC METABOLIC PANEL
Anion gap: 7 (ref 5–15)
BUN: 15 mg/dL (ref 6–20)
CO2: 24 mmol/L (ref 22–32)
Calcium: 8 mg/dL — ABNORMAL LOW (ref 8.9–10.3)
Chloride: 106 mmol/L (ref 101–111)
Creatinine, Ser: 0.62 mg/dL (ref 0.44–1.00)
GFR calc Af Amer: >60 mL/min (ref >60)
GFR calc non Af Amer: >60 mL/min (ref >60)
Glucose, Bld: 238 mg/dL — ABNORMAL HIGH (ref 65–99)
Potassium: 4.2 mmol/L (ref 3.5–5.1)
Sodium: 137 mmol/L (ref 135–145)

## 2015-02-24 LAB — CBC
HCT: 36.2 % (ref 36.0–46.0)
Hemoglobin: 11.4 g/dL — ABNORMAL LOW (ref 12.0–15.0)
MCH: 28.6 pg (ref 26.0–34.0)
MCHC: 31.5 g/dL (ref 30.0–36.0)
MCV: 90.7 fL (ref 78.0–100.0)
Platelets: 178 10*3/uL (ref 150–400)
RBC: 3.99 MIL/uL (ref 3.87–5.11)
RDW: 14.4 % (ref 11.5–15.5)
WBC: 6.2 10*3/uL (ref 4.0–10.5)

## 2015-02-24 LAB — HEMOGLOBIN A1C
Hgb A1c MFr Bld: 8.4 % — ABNORMAL HIGH (ref 4.8–5.6)
Mean Plasma Glucose: 194 mg/dL

## 2015-02-24 LAB — GLUCOSE, CAPILLARY
Glucose-Capillary: 209 mg/dL — ABNORMAL HIGH (ref 65–99)
Glucose-Capillary: 214 mg/dL — ABNORMAL HIGH (ref 65–99)

## 2015-02-24 LAB — LEGIONELLA ANTIGEN, URINE

## 2015-02-24 MED ORDER — CALCIUM CARBONATE 1250 (500 CA) MG PO TABS
1.0000 | ORAL_TABLET | Freq: Every day | ORAL | Status: DC
  Administered 2015-02-24: 500 mg via ORAL
  Filled 2015-02-24 (×2): qty 1

## 2015-02-24 MED ORDER — FLUCONAZOLE 150 MG PO TABS: 150.0000 mg | ORAL_TABLET | Freq: Every day | ORAL | Status: DC

## 2015-02-24 MED ORDER — CALCIUM CARBONATE 1250 (500 CA) MG PO TABS: 1.0000 | ORAL_TABLET | Freq: Every day | ORAL | Status: DC

## 2015-02-24 MED ORDER — LEVOFLOXACIN 750 MG PO TABS: 750.0000 mg | ORAL_TABLET | Freq: Every day | ORAL | Status: DC

## 2015-02-24 MED ORDER — SENNOSIDES-DOCUSATE SODIUM 8.6-50 MG PO TABS: 1.0000 | ORAL_TABLET | Freq: Two times a day (BID) | ORAL | Status: DC

## 2015-02-24 MED ORDER — SORBITOL 70 % SOLN
30.0000 mL | Freq: Once | Status: DC
  Filled 2015-02-24: qty 30

## 2015-02-24 MED ORDER — SENNOSIDES-DOCUSATE SODIUM 8.6-50 MG PO TABS
1.0000 | ORAL_TABLET | Freq: Two times a day (BID) | ORAL | Status: DC
  Administered 2015-02-24: 1 via ORAL
  Filled 2015-02-24: qty 1

## 2015-02-24 MED FILL — levoFLOXacin 750 MG TABS: 750 | 6 days supply | Qty: 6 | Fill #0

## 2015-02-24 MED FILL — FLUCONAZOLE 150 MG TABLET: 150 | 1 days supply | Qty: 1 | Fill #0

## 2015-02-24 MED FILL — HYDROCODON-APAP 5-325: 5-325 | 1 days supply | Qty: 20 | Fill #0

## 2015-02-24 NOTE — Progress Notes (Signed)
Pt discharged from the unit via wheelchair. Discharge instructions reviewed with the pt. Belongings sent home with pt. Prescriptions provided to pt.  Alexis [name] W Benigna Delisi, RN

## 2015-02-24 NOTE — Progress Notes (Signed)
General Surgery [facility]  Patient seen and examined.  Provided copy of path report to patient.  Doing well.  No complaints.  WBC normal.  Wound healing uneventfully.  Voice normal.  OK for discharge from surgical standpoint.  Will arrange follow up with me at [place] office later this month.  Earnstine Regal, MD, [facility] Office: [phone]

## 2015-02-24 NOTE — Discharge Summary (Signed)
Physician Discharge Summary  Alexis [name] [id number] DOB: [birth year] DOA: 02/22/2015  PCP: Alexis Graves, DO  Admit date: 02/22/2015 Discharge date: 02/24/2015  Time spent: 60 minutes  Recommendations for Outpatient Follow-up:  1. Follow-up with general surgery as scheduled. 2. Follow-up with Alexis BUTLER, DO in 1-2 weeks.   Discharge Diagnoses:  Principal Problem:   CAP (community acquired pneumonia) Active Problems:   Diabetes (Alexis [name])   Hyperlipidemia   Essential hypertension   Primary hyperparathyroidism (Alexis [name])   Discharge Condition: Stable and improved.  Diet recommendation: Regular  Filed Weights   02/22/15 2237  Weight: 89.812 kg (198 lb)    History of present illness:  Per Alexis [name] is a 60 y.o. female with a history of DM2, HTN, Primary Hyperparathyroid S/P Parathyroid Surgery on 02/21/2015 who presented to the ED with complaints of SOB and Cough and Fevers and Chills x 2 days. A Chest X-ray revealed Left Lung Airspace Disease. She was placed on antibiotic Rx of IV Rocephin and Azithromycin for CAP Pneumonia and Referred for Admission.   Hospital Course:  #1 Community acquired pneumonia Patient was admitted with fever chills shortness of breath and cough noted on chest x-ray to have a left lower lobe pneumonia. Patient was placed empirically on IV azithromycin and IV Rocephin as well as nebulizer treatments. Patient improved clinically subsequently transitioned to oral Levaquin and will be discharged home on 6 more days of oral Levaquin.  #2 diabetes mellitus Remained stable throughout the hospitalization. Patient was maintained on sliding scale insulin.  #3 hyperlipidemia Stable.  #4 hypertension  maintained on home regimen of ramipril.  #5 primary hyperparathyroidism status post surgery Patient was seen in consultation by general surgery who followed the patient throughout the hospitalization. Patient was placed on  Os-Cal. Outpatient follow-up.  Procedures:  Chest x-ray 02/23/2015  Consultations:  Gen. surgery: Alexis. Johney [name] 02/23/2015  Discharge Exam: Filed Vitals:   02/24/15 0927 02/24/15 1317  BP: 130/41 113/39  Pulse: 79 93  Temp: 98.2 F (36.8 C) 98.1 F (36.7 C)  Resp: 18 18    General: NAD Cardiovascular: RRR Respiratory: CTAB  Discharge Instructions   Discharge Instructions    Diet general    Complete by:  As directed      Discharge instructions    Complete by:  As directed   Follow up with Alexis BUTLER, DO in 1-2 weeks. Follow up with General Surgery as scheduled.     Increase activity slowly    Complete by:  As directed           Discharge Medication List as of 02/24/2015  3:02 PM    START taking these medications   Details  calcium carbonate (OS-CAL - DOSED IN MG OF ELEMENTAL CALCIUM) 1250 (500 Ca) MG tablet Take 1 tablet (500 mg of elemental calcium total) by mouth daily with breakfast., Starting 02/24/2015, Until Discontinued, No Print    levofloxacin (LEVAQUIN) 750 MG tablet Take 1 tablet (750 mg total) by mouth daily. Take for 6 days then stop., Starting 02/24/2015, Until Discontinued, Print    senna-docusate (SENOKOT-S) 8.6-50 MG tablet Take 1 tablet by mouth 2 (two) times daily., Starting 02/24/2015, Until Discontinued, No Print      CONTINUE these medications which have NOT CHANGED   Details  aspirin 81 MG tablet Take 81 mg by mouth every other day. , Until Discontinued, Historical Med    Cetirizine HCl (ZYRTEC ALLERGY PO) Take 1 tablet by mouth every morning. , Until Discontinued, Historical  Med    HYDROcodone-acetaminophen (NORCO/VICODIN) 5-325 MG tablet Take 1-2 tablets by mouth every 4 (four) hours as needed for moderate pain., Starting 02/21/2015, Until Discontinued, Print    ibuprofen (ADVIL,MOTRIN) 800 MG tablet Take 1 tablet (800 mg total) by mouth 3 (three) times daily., Starting 01/09/2015, Until Discontinued, Print    levothyroxine (SYNTHROID,  LEVOTHROID) 25 MCG tablet Take 25 mcg by mouth every morning. , Until Discontinued, Historical Med    Liraglutide (VICTOZA) 18 MG/3ML SOPN Inject 1.8 mg into the skin every morning. , Until Discontinued, Historical Med    metFORMIN (GLUMETZA) 500 MG (MOD) 24 hr tablet Take 500 mg by mouth 2 (two) times daily. , Until Discontinued, Historical Med    ondansetron (ZOFRAN) 4 MG tablet Take 4 mg by mouth every 8 (eight) hours as needed for nausea or vomiting., Until Discontinued, Historical Med    pantoprazole (PROTONIX) 40 MG tablet Take 40 mg by mouth daily., Until Discontinued, Historical Med    ramipril (ALTACE) 5 MG capsule Take 5 mg by mouth daily., Until Discontinued, Historical Med       Allergies  Allergen Reactions  . Canagliflozin     Other reaction(s): yeast infection  . Empagliflozin     Other reaction(s): upset stomach  . Metformin Hcl     Other reaction(s): diarrhea  . Parlodel [Bromocriptine Mesylate] Hives   Follow-up Information    Follow up with Alexis BUTLER, DO. Schedule an appointment as soon as possible for a visit in 1 week.   Why:  f/u in 1-2 weeks.   Contact information:   [address] [address] [phone]       Follow up with [facility]   Specialty:  General Surgery   Why:  f/u as scheduled. Office will call with appointment.   Contact information:   [address] [place] [phone]       The results of significant diagnostics from this hospitalization (including imaging, microbiology, ancillary and laboratory) are listed below for reference.    Significant Diagnostic Studies: Dg Chest 2 View  02/23/2015  CLINICAL DATA:  Acute onset of shortness of breath, fever and hyperglycemia. Cough. Initial encounter. EXAM: CHEST  2 VIEW COMPARISON:  Chest radiograph performed 01/20/2012 FINDINGS: The lungs are well-aerated. Mild left basilar opacity may reflect atelectasis or  possibly mild pneumonia. A small left pleural effusion is suspected. There is no evidence of pneumothorax. The heart is mildly enlarged. No acute osseous abnormalities are seen. Clips are noted within the right upper quadrant, reflecting prior cholecystectomy. IMPRESSION: Mild left basilar airspace opacity may reflect atelectasis or possibly mild pneumonia. Small left pleural effusion suspected. Mild cardiomegaly. Electronically Signed   By: Garald Balding M.D.   On: 02/23/2015 00:20    Microbiology: No results found for this or any previous visit (from the past 240 hour(s)).   Labs: Basic Metabolic Panel:  Recent Labs Lab 02/22/15 0441 02/22/15 2250 02/23/15 0614 02/24/15 0639  NA 135 135 135 137  K 4.8 5.0 4.2 4.2  CL 102 103 105 106  CO2 24 22 23 24   GLUCOSE 275* 399* 223* 238*  BUN 15 25* 20 15  CREATININE 0.80 1.15* 0.85 0.62  CALCIUM 9.3 9.3 8.2* 8.0*   Liver Function Tests: No results for input(s): AST, ALT, ALKPHOS, BILITOT, PROT, ALBUMIN in the last 168 hours. No results for input(s): LIPASE, AMYLASE in the last 168 hours. No results for input(s): AMMONIA in  the last 168 hours. CBC:  Recent Labs Lab 02/22/15 2250 02/23/15 0614 02/24/15 0639  WBC 10.9* 8.1 6.2  HGB 12.8 12.2 11.4*  HCT 39.9 37.9 36.2  MCV 88.5 90.2 90.7  PLT 235 206 178   Cardiac Enzymes: No results for input(s): CKTOTAL, CKMB, CKMBINDEX, TROPONINI in the last 168 hours. BNP: BNP (last 3 results) No results for input(s): BNP in the last 8760 hours.  ProBNP (last 3 results) No results for input(s): PROBNP in the last 8760 hours.  CBG:  Recent Labs Lab 02/23/15 1143 02/23/15 1658 02/23/15 2149 02/24/15 0746 02/24/15 1212  GLUCAP 340* 218* 247* 209* 214*       Signed:  THOMPSON,DANIEL MD.  [facility] 02/24/2015, 7:38 PM

## 2015-02-24 NOTE — Progress Notes (Signed)
Patient ID: Alexis [name], female   DOB: 1955/06/22, 60 y.o.   MRN: [id number]     [facility]      [address] [place] [place], [place] [address]    Phone: [phone] FAX: [phone]     Subjective: Walking.  No sob, cp.  Yellow productive cough. Ca 8.    Objective:  Vital signs:  Filed Vitals:   02/23/15 1718 02/23/15 2104 02/23/15 2230 02/24/15 0658  BP: 128/40  110/47 110/54  Pulse: 72  76 74  Temp: 98.6 F (37 C)  98.5 F (36.9 C) 98.1 F (36.7 C)  TempSrc: Oral  Oral Oral  Resp: _0 Height:      Weight:      SpO2: 95% 94% 98% 96%    Last BM Date: 02/22/15  Intake/Output   Yesterday:  02/12 0701 - 02/13 0700 In: 360 [P.O.:360] Out: -  This shift:  Total I/O In: 120 [P.O.:120] Out: -    Physical Exam: General: Pt awake/alert/oriented x4 in no acute distress Neck: steri strips in place.    Problem List:   Principal Problem:   CAP (community acquired pneumonia) Active Problems:   Diabetes ([place])   Hyperlipidemia   Essential hypertension   Primary hyperparathyroidism ([place])    Results:   Labs: Results for orders placed or performed during the hospital encounter of 02/22/15 (from the past 48 hour(s))  Basic metabolic panel     Status: Abnormal   Collection Time: 02/22/15 10:50 PM  Result Value Ref Range   Sodium 135 135 - 145 mmol/L   Potassium 5.0 3.5 - 5.1 mmol/L   Chloride 103 101 - 111 mmol/L   CO2 22 22 - 32 mmol/L   Glucose, Bld 399 (H) 65 - 99 mg/dL   BUN 25 (H) 6 - 20 mg/dL   Creatinine, Ser 1.15 (H) 0.44 - 1.00 mg/dL   Calcium 9.3 8.9 - 10.3 mg/dL   GFR calc non Af Amer 51 (L) >60 mL/min   GFR calc Af Amer 59 (L) >60 mL/min    Comment: (NOTE) The eGFR has been calculated using the CKD EPI equation. This calculation has not been validated in all clinical situations. eGFR's persistently <60 mL/min signify possible Chronic Kidney Disease.    Anion gap 10 5 - 15  CBC      Status: Abnormal   Collection Time: 02/22/15 10:50 PM  Result Value Ref Range   WBC 10.9 (H) 4.0 - 10.5 K/uL   RBC 4.51 3.87 - 5.11 MIL/uL   Hemoglobin 12.8 12.0 - 15.0 g/dL   HCT 39.9 36.0 - 46.0 %   MCV 88.5 78.0 - 100.0 fL   MCH 28.4 26.0 - 34.0 pg   MCHC 32.1 30.0 - 36.0 g/dL   RDW 14.1 11.5 - 15.5 %   Platelets 235 150 - 400 K/uL  CBG monitoring, ED     Status: Abnormal   Collection Time: 02/22/15 10:57 PM  Result Value Ref Range   Glucose-Capillary 358 (H) 65 - 99 mg/dL   Comment 1 Notify RN    Comment 2 Document in Chart   Urinalysis, Routine w reflex microscopic (not at [facility])     Status: Abnormal   Collection Time: 02/23/15  1:17 AM  Result Value Ref Range   Color, Urine AMBER (A) YELLOW    Comment: BIOCHEMICALS MAY BE AFFECTED BY COLOR   APPearance CLEAR CLEAR   Specific Gravity, Urine 1.039 (  H) 1.005 - 1.030   pH 5.5 5.0 - 8.0   Glucose, UA >1000 (A) NEGATIVE mg/dL   Hgb urine dipstick NEGATIVE NEGATIVE   Bilirubin Urine NEGATIVE NEGATIVE   Ketones, ur NEGATIVE NEGATIVE mg/dL   Protein, ur NEGATIVE NEGATIVE mg/dL   Nitrite NEGATIVE NEGATIVE   Leukocytes, UA NEGATIVE NEGATIVE  Urine microscopic-add on     Status: Abnormal   Collection Time: 02/23/15  1:17 AM  Result Value Ref Range   Squamous Epithelial / LPF 0-5 (A) NONE SEEN   WBC, UA 0-5 0 - 5 WBC/hpf   RBC / HPF 0-5 0 - 5 RBC/hpf   Bacteria, UA RARE (A) NONE SEEN  CBG monitoring, ED     Status: Abnormal   Collection Time: 02/23/15  4:45 AM  Result Value Ref Range   Glucose-Capillary 192 (H) 65 - 99 mg/dL   Comment 1 Notify RN    Comment 2 Document in Chart   Basic metabolic panel     Status: Abnormal   Collection Time: 02/23/15  6:14 AM  Result Value Ref Range   Sodium 135 135 - 145 mmol/L   Potassium 4.2 3.5 - 5.1 mmol/L   Chloride 105 101 - 111 mmol/L   CO2 23 22 - 32 mmol/L   Glucose, Bld 223 (H) 65 - 99 mg/dL   BUN 20 6 - 20 mg/dL   Creatinine, Ser 0.85 0.44 - 1.00 mg/dL   Calcium 8.2 (L) 8.9 -  10.3 mg/dL   GFR calc non Af Amer >60 >60 mL/min   GFR calc Af Amer >60 >60 mL/min    Comment: (NOTE) The eGFR has been calculated using the CKD EPI equation. This calculation has not been validated in all clinical situations. eGFR's persistently <60 mL/min signify possible Chronic Kidney Disease.    Anion gap 7 5 - 15  CBC     Status: None   Collection Time: 02/23/15  6:14 AM  Result Value Ref Range   WBC 8.1 4.0 - 10.5 K/uL   RBC 4.20 3.87 - 5.11 MIL/uL   Hemoglobin 12.2 12.0 - 15.0 g/dL   HCT 37.9 36.0 - 46.0 %   MCV 90.2 78.0 - 100.0 fL   MCH 29.0 26.0 - 34.0 pg   MCHC 32.2 30.0 - 36.0 g/dL   RDW 14.5 11.5 - 15.5 %   Platelets 206 150 - 400 K/uL  Glucose, capillary     Status: Abnormal   Collection Time: 02/23/15  8:20 AM  Result Value Ref Range   Glucose-Capillary 265 (H) 65 - 99 mg/dL  Strep pneumoniae urinary antigen     Status: None   Collection Time: 02/23/15  9:00 AM  Result Value Ref Range   Strep Pneumo Urinary Antigen NEGATIVE NEGATIVE    Comment:        Infection due to S. pneumoniae cannot be absolutely ruled out since the antigen present may be below the detection limit of the test. Performed at [facility]   Glucose, capillary     Status: Abnormal   Collection Time: 02/23/15 11:43 AM  Result Value Ref Range   Glucose-Capillary 340 (H) 65 - 99 mg/dL  Glucose, capillary     Status: Abnormal   Collection Time: 02/23/15  4:58 PM  Result Value Ref Range   Glucose-Capillary 218 (H) 65 - 99 mg/dL  Glucose, capillary     Status: Abnormal   Collection Time: 02/23/15  9:49 PM  Result Value Ref Range   Glucose-Capillary  247 (H) 65 - 99 mg/dL  Basic metabolic panel     Status: Abnormal   Collection Time: 02/24/15  6:39 AM  Result Value Ref Range   Sodium 137 135 - 145 mmol/L   Potassium 4.2 3.5 - 5.1 mmol/L   Chloride 106 101 - 111 mmol/L   CO2 24 22 - 32 mmol/L   Glucose, Bld 238 (H) 65 - 99 mg/dL   BUN 15 6 - 20 mg/dL   Creatinine, Ser 0.62  0.44 - 1.00 mg/dL   Calcium 8.0 (L) 8.9 - 10.3 mg/dL   GFR calc non Af Amer >60 >60 mL/min   GFR calc Af Amer >60 >60 mL/min    Comment: (NOTE) The eGFR has been calculated using the CKD EPI equation. This calculation has not been validated in all clinical situations. eGFR's persistently <60 mL/min signify possible Chronic Kidney Disease.    Anion gap 7 5 - 15  CBC     Status: Abnormal   Collection Time: 02/24/15  6:39 AM  Result Value Ref Range   WBC 6.2 4.0 - 10.5 K/uL   RBC 3.99 3.87 - 5.11 MIL/uL   Hemoglobin 11.4 (L) 12.0 - 15.0 g/dL   HCT 36.2 36.0 - 46.0 %   MCV 90.7 78.0 - 100.0 fL   MCH 28.6 26.0 - 34.0 pg   MCHC 31.5 30.0 - 36.0 g/dL   RDW 14.4 11.5 - 15.5 %   Platelets 178 150 - 400 K/uL  Glucose, capillary     Status: Abnormal   Collection Time: 02/24/15  7:46 AM  Result Value Ref Range   Glucose-Capillary 209 (H) 65 - 99 mg/dL    Imaging / Studies: Dg Chest 2 View  02/23/2015  CLINICAL DATA:  Acute onset of shortness of breath, fever and hyperglycemia. Cough. Initial encounter. EXAM: CHEST  2 VIEW COMPARISON:  Chest radiograph performed 01/20/2012 FINDINGS: The lungs are well-aerated. Mild left basilar opacity may reflect atelectasis or possibly mild pneumonia. A small left pleural effusion is suspected. There is no evidence of pneumothorax. The heart is mildly enlarged. No acute osseous abnormalities are seen. Clips are noted within the right upper quadrant, reflecting prior cholecystectomy. IMPRESSION: Mild left basilar airspace opacity may reflect atelectasis or possibly mild pneumonia. Small left pleural effusion suspected. Mild cardiomegaly. Electronically Signed   By: Garald Balding M.D.   On: 02/23/2015 00:20    Medications / Allergies:  Scheduled Meds: . albuterol  2.5 mg Nebulization BID  . aspirin  81 mg Oral QODAY  . azithromycin (ZITHROMAX) 500 MG IVPB  500 mg Intravenous Once  . azithromycin  500 mg Intravenous Q24H  . cefTRIAXone (ROCEPHIN)  IV  1 g  Intravenous Q24H  . cetirizine  10 mg Oral Daily  . enoxaparin (LOVENOX) injection  40 mg Subcutaneous Q24H  . insulin aspart  0-5 Units Subcutaneous QHS  . insulin aspart  0-9 Units Subcutaneous TID WC  . levothyroxine  25 mcg Oral QAC breakfast  . pantoprazole  40 mg Oral Daily  . ramipril  5 mg Oral Daily  . senna-docusate  1 tablet Oral BID  . sodium chloride flush  3 mL Intravenous Q12H  . sodium chloride flush  3 mL Intravenous Q12H  . sorbitol  30 mL Oral Once   Continuous Infusions:  PRN Meds:.sodium chloride, acetaminophen **OR** acetaminophen, alum & mag hydroxide-simeth, HYDROmorphone (DILAUDID) injection, lip balm, ondansetron **OR** ondansetron (ZOFRAN) IV, oxyCODONE, sodium chloride flush  Antibiotics: Anti-infectives    Start  Dose/Rate Route Frequency Ordered Stop   02/24/15 0600  azithromycin (ZITHROMAX) 500 mg in dextrose 5 % 250 mL IVPB     500 mg 250 mL/hr over 60 Minutes Intravenous Every 24 hours 02/23/15 0526 03/03/15 0559   02/24/15 0300  cefTRIAXone (ROCEPHIN) 1 g in dextrose 5 % 50 mL IVPB     1 g 100 mL/hr over 30 Minutes Intravenous Every 24 hours 02/23/15 0526 03/03/15 0259   02/23/15 0230  cefTRIAXone (ROCEPHIN) 1 g in dextrose 5 % 50 mL IVPB     1 g 100 mL/hr over 30 Minutes Intravenous  Once 02/23/15 0226 02/23/15 0435   02/23/15 0230  azithromycin (ZITHROMAX) 500 mg in dextrose 5 % 250 mL IVPB     500 mg 250 mL/hr over 60 Minutes Intravenous  Once 02/23/15 0226          Assessment/Plan S/p right parathyroidectomy---Dr. Harlow Asa -may shower -will add supplemental Ca CAP-may change to PO antibiotics and discharge from a surgical standpoint.   Erby Pian, ANP-BC [facility]   02/24/2015 9:15 AM

## 2015-02-26 DIAGNOSIS — Z7984 Long term (current) use of oral hypoglycemic drugs: Secondary | ICD-10-CM | POA: Diagnosis not present

## 2015-02-26 DIAGNOSIS — N200 Calculus of kidney: Secondary | ICD-10-CM | POA: Diagnosis not present

## 2015-02-26 DIAGNOSIS — E785 Hyperlipidemia, unspecified: Secondary | ICD-10-CM | POA: Diagnosis not present

## 2015-02-26 DIAGNOSIS — Z8639 Personal history of other endocrine, nutritional and metabolic disease: Secondary | ICD-10-CM | POA: Diagnosis not present

## 2015-02-26 DIAGNOSIS — E538 Deficiency of other specified B group vitamins: Secondary | ICD-10-CM | POA: Diagnosis not present

## 2015-02-26 DIAGNOSIS — Z6833 Body mass index (BMI) 33.0-33.9, adult: Secondary | ICD-10-CM | POA: Diagnosis not present

## 2015-02-26 DIAGNOSIS — E1165 Type 2 diabetes mellitus with hyperglycemia: Secondary | ICD-10-CM | POA: Diagnosis not present

## 2015-02-26 MED FILL — REPAGLINIDE 1 MG TABLET: 1 | 90 days supply | Qty: 180 | Fill #0

## 2015-03-05 DIAGNOSIS — E213 Hyperparathyroidism, unspecified: Secondary | ICD-10-CM | POA: Diagnosis not present

## 2015-03-05 DIAGNOSIS — K219 Gastro-esophageal reflux disease without esophagitis: Secondary | ICD-10-CM | POA: Diagnosis not present

## 2015-03-05 DIAGNOSIS — E1165 Type 2 diabetes mellitus with hyperglycemia: Secondary | ICD-10-CM | POA: Diagnosis not present

## 2015-03-05 DIAGNOSIS — E784 Other hyperlipidemia: Secondary | ICD-10-CM | POA: Diagnosis not present

## 2015-03-05 DIAGNOSIS — I10 Essential (primary) hypertension: Secondary | ICD-10-CM | POA: Diagnosis not present

## 2015-03-06 DIAGNOSIS — E213 Hyperparathyroidism, unspecified: Secondary | ICD-10-CM | POA: Diagnosis not present

## 2015-03-21 ENCOUNTER — Encounter: Payer: 59 | Attending: *Deleted | Admitting: *Deleted

## 2015-03-21 ENCOUNTER — Encounter: Payer: Self-pay | Admitting: *Deleted

## 2015-03-21 DIAGNOSIS — E1165 Type 2 diabetes mellitus with hyperglycemia: Secondary | ICD-10-CM

## 2015-03-21 DIAGNOSIS — Z713 Dietary counseling and surveillance: Secondary | ICD-10-CM | POA: Diagnosis not present

## 2015-03-21 DIAGNOSIS — IMO0002 Reserved for concepts with insufficient information to code with codable children: Secondary | ICD-10-CM

## 2015-03-21 DIAGNOSIS — E118 Type 2 diabetes mellitus with unspecified complications: Secondary | ICD-10-CM

## 2015-03-21 NOTE — Patient Instructions (Signed)
Plan:  Aim for 2-3 Carb Choices per meal (30-45 grams)  Aim for 0-1 Carbs per snack if hungry  Include protein in moderation with your meals and snacks Consider reading food labels for Total Carbohydrate of foods Consider  increasing your activity level by doing some Arm Chair Exercises daily as tolerated Continue checking BG at alternate times per day   Continue taking medication as directed by MD

## 2015-03-21 NOTE — Progress Notes (Signed)
Diabetes Self-Management Education  Visit Type: First/Initial  Appt. Start Time: 0730 Appt. End Time: 0830  03/21/2015  [name], identified by name and date of birth, is a 60 y.o. female with a diagnosis of Diabetes: Type 2.   ASSESSMENT  There were no vitals taken for this visit. There is no weight on file to calculate BMI.      Diabetes Self-Management Education - 03/21/15 0748    Visit Information   Visit Type First/Initial   Initial Visit   Diabetes Type Type 2   Are you currently following a meal plan? No   Are you taking your medications as prescribed? Yes   Date Diagnosed several years   Health Coping   How would you rate your overall health? Fair   Psychosocial Assessment   Patient Belief/Attitude about Diabetes Other (comment)  make it go away   Self-care barriers None   Self-management support None   Other persons present Patient   Patient Concerns Nutrition/Meal planning   Preferred Learning Style Hands on;Visual   How often do you need to have someone help you when you read instructions, pamphlets, or other written materials from your doctor or pharmacy? 1 - Never   What is the last grade level you completed in school? 1 year college   Complications   Last HgB A1C per patient/outside source 8.4 %   How often do you check your blood sugar? 1-2 times/day   Fasting Blood glucose range (mg/dL) 130-179;180-200   Postprandial Blood glucose range (mg/dL) 130-179   Number of hypoglycemic episodes per month 0   Have you had a dilated eye exam in the past 12 months? Yes   Have you had a dental exam in the past 12 months? Yes   Are you checking your feet? Yes   How many days per week are you checking your feet? 7   Dietary Intake   Breakfast skips 2 days a week, cereal with 2% milk OR regular oatmeal OR eggs with toast   Snack (morning) not usually, maybe a PNB cracker   Lunch brings from home; sandwich OR salad with vegetables OR left overs   Snack  (afternoon) no   Dinner meat, starch, vegetable, occasionally bread or salad   Snack (evening) not usually, maybe fresh fruit or raw vegetables   Beverage(s) water   Exercise   Exercise Type ADL's  not lately with surgery   How many days per week to you exercise? 0   How many minutes per day do you exercise? 0   Total minutes per week of exercise 0   Patient Education   Previous Diabetes Education Yes (please comment)  3 years ago and with LTW now   Nutrition management  Role of diet in the treatment of diabetes and the relationship between the three main macronutrients and blood glucose level;Food label reading, portion sizes and measuring food.;Carbohydrate counting   Physical activity and exercise  Role of exercise on diabetes management, blood pressure control and cardiac health.;Helped patient identify appropriate exercises in relation to his/her diabetes, diabetes complications and other health issue.   Medications Reviewed patients medication for diabetes, action, purpose, timing of dose and side effects.   Monitoring Purpose and frequency of SMBG.;Identified appropriate SMBG and/or A1C goals.   Individualized Goals (developed by patient)   Nutrition Follow meal plan discussed   Physical Activity 15 minutes per day   Monitoring  test blood glucose pre and post meals as discussed   Outcomes  Expected Outcomes Demonstrated interest in learning. Expect positive outcomes   Future DMSE PRN   Program Status Completed      Individualized Plan for Diabetes Self-Management Training:   Learning Objective:  Patient will have a greater understanding of diabetes self-management. Patient education plan is to attend individual and/or group sessions per assessed needs and concerns.   Plan:   Patient Instructions  Plan:  Aim for 2-3 Carb Choices per meal (30-45 grams)  Aim for 0-1 Carbs per snack if hungry  Include protein in moderation with your meals and snacks Consider reading food  labels for Total Carbohydrate of foods Consider  increasing your activity level by doing some Arm Chair Exercises daily as tolerated Continue checking BG at alternate times per day   Continue taking medication as directed by MD      Expected Outcomes:  Demonstrated interest in learning. Expect positive outcomes  Education material provided: Food label handouts, A1C conversion sheet, Meal plan card and Carbohydrate counting sheet, Arm Chair Exercise  If problems or questions, patient to contact team via:  Phone and Email  Future DSME appointment: PRN

## 2015-04-16 MED FILL — METFORMIN HCL ER 500 MG TAB: 500 | 90 days supply | Qty: 360 | Fill #0

## 2015-04-16 MED FILL — VICTOZA 18 MG/3 ML INJECT P: 18 | 90 days supply | Qty: 27 | Fill #1

## 2015-04-23 MED FILL — PHENTERMINE 37.5 MG TABLET: 37.5 | 90 days supply | Qty: 90 | Fill #0

## 2015-04-23 MED FILL — LEVOTHYROXINE 25 MCG TABLET: 25 | 90 days supply | Qty: 90 | Fill #0

## 2015-05-09 DIAGNOSIS — E1165 Type 2 diabetes mellitus with hyperglycemia: Secondary | ICD-10-CM | POA: Diagnosis not present

## 2015-05-09 DIAGNOSIS — K219 Gastro-esophageal reflux disease without esophagitis: Secondary | ICD-10-CM | POA: Diagnosis not present

## 2015-05-09 DIAGNOSIS — E784 Other hyperlipidemia: Secondary | ICD-10-CM | POA: Diagnosis not present

## 2015-06-12 MED FILL — UNIFINE PENTIPS 32GX5/32: 32G X 4 MM | 90 days supply | Qty: 100 | Fill #0 | Status: TO

## 2015-07-11 MED FILL — VICTOZA 18 MG/3 ML INJECT P: 18 | 90 days supply | Qty: 27 | Fill #2

## 2015-07-11 MED FILL — METFORMIN HCL ER 500 MG TAB: 500 | 90 days supply | Qty: 360 | Fill #1

## 2015-07-17 MED FILL — PANTOPRAZOLE SOD DR 40 MG T: 40 | 90 days supply | Qty: 90 | Fill #0

## 2015-07-18 MED FILL — CONTOUR NEXT STRIPS: 50 days supply | Qty: 100 | Fill #0

## 2015-07-22 MED FILL — PHENTERMINE 37.5 MG TABLET: 37.5 | 90 days supply | Qty: 90 | Fill #1

## 2015-08-15 DIAGNOSIS — I10 Essential (primary) hypertension: Secondary | ICD-10-CM | POA: Diagnosis not present

## 2015-08-15 DIAGNOSIS — K219 Gastro-esophageal reflux disease without esophagitis: Secondary | ICD-10-CM | POA: Diagnosis not present

## 2015-08-15 DIAGNOSIS — E1165 Type 2 diabetes mellitus with hyperglycemia: Secondary | ICD-10-CM | POA: Diagnosis not present

## 2015-08-15 DIAGNOSIS — E784 Other hyperlipidemia: Secondary | ICD-10-CM | POA: Diagnosis not present

## 2015-08-22 ENCOUNTER — Other Ambulatory Visit: Payer: Self-pay | Admitting: Pharmacist

## 2015-08-22 ENCOUNTER — Encounter: Payer: Self-pay | Admitting: Pharmacist

## 2015-08-22 MED FILL — CONTOUR NEXT ONE KIT: 30 days supply | Qty: 1 | Fill #0

## 2015-08-22 NOTE — Patient Outreach (Addendum)
[facility]) Care Management  [place]   08/22/2015  Alexis [name] [birth year] [id number]  Subjective: Patient presents today for diabetes follow-up as part of the employer-sponsored Link to Wellness program.  Current diabetes regimen includes metformin (Glumetza) 500 mg twice daily, repaglinide 1 mg twice daily before meals, and Victoza 1.8 mg daily.  Patient also continues on daily ASA.  She is not currently on ACE-inhibitor or statin.  Most recent MD follow-up was 08/15/15.  Patient has a pending appt for 10/2015 for followup and return to primary care provider for labs in the next few weeks. She had parathyroid surgery and states her ramipril was stopped. She reports having frequent diarrhea almost every day with stools 3-4 times per day.  She has stopped metformin before on the weekends without complete resolution of diarrhea.  She also has history of diverticulitis and removal of 12 inches of her colon which may be contributing.  Patient denies hypoglycemic events.  Patient reported dietary habits: Eats 2-3 meals/day Breakfast:Pack of crackers Lunch: drug representative lunches provided at her clinic (usually salad or sandwiches) or grilled chicken sandwich Dinner:Grills or bakes most meats.  Last night she had pot roast with potatoes/onions and salad Drinks:Increase water recently, half unsweet tea/half sweet tea  Patient reported exercise habits: Trying to increase walking.  Walks 15-20 minutes/day if not raining   Patient reports nocturia 2-3 times per week Patient denies pain/burning upon urination.  Patient denies neuropathy. Patient denies visual changes. Patient reports self foot exams.    She reports checking her blood glucose twice daily before her blood glucose meter broke 2 weeks ago and will not turn back on.   Reported AM Fasting CBG range prior to meter breaking: 130-160 mg/dL PM CBG range: 120-140  Objective:  Lab Results  Component  Value Date   HGBA1C 8.4 (H) 02/23/2015   Vitals:   08/22/15 0812  BP: 139/76  Pulse: 71    Encounter Medications: Outpatient Encounter Prescriptions as of 08/22/2015  Medication Sig  . aspirin 81 MG tablet Take 81 mg by mouth every other day.   . Calcium Carbonate-Vitamin D (CALCIUM-VITAMIN D) 500-200 MG-UNIT tablet Take 1 tablet by mouth daily.  . Cetirizine HCl (ZYRTEC ALLERGY PO) Take 1 tablet by mouth every morning.   . fluconazole (DIFLUCAN) 150 MG tablet Take 1 tablet (150 mg total) by mouth daily. Take once as needed.  Marland Kitchen levothyroxine (SYNTHROID, LEVOTHROID) 25 MCG tablet Take 25 mcg by mouth every morning.   . Liraglutide (VICTOZA) 18 MG/3ML SOPN Inject 1.8 mg into the skin every morning.   . metFORMIN (GLUMETZA) 500 MG (MOD) 24 hr tablet Take 500 mg by mouth 2 (two) times daily.   . ondansetron (ZOFRAN) 4 MG tablet Take 4 mg by mouth every 8 (eight) hours as needed for nausea or vomiting.  . pantoprazole (PROTONIX) 40 MG tablet Take 40 mg by mouth daily.  . repaglinide (PRANDIN) 1 MG tablet Take 1 mg by mouth 2 (two) times daily before a meal.  . [DISCONTINUED] ibuprofen (ADVIL,MOTRIN) 800 MG tablet Take 1 tablet (800 mg total) by mouth 3 (three) times daily. (Patient taking differently: Take 800 mg by mouth every 8 (eight) hours as needed. )  . [DISCONTINUED] calcium carbonate (OS-CAL - DOSED IN MG OF ELEMENTAL CALCIUM) 1250 (500 Ca) MG tablet Take 1 tablet (500 mg of elemental calcium total) by mouth daily with breakfast.  . [DISCONTINUED] HYDROcodone-acetaminophen (NORCO/VICODIN) 5-325 MG tablet Take 1-2 tablets by mouth every  4 (four) hours as needed for moderate pain. (Patient not taking: Reported on 03/21/2015)  . [DISCONTINUED] levofloxacin (LEVAQUIN) 750 MG tablet Take 1 tablet (750 mg total) by mouth daily. Take for 6 days then stop. (Patient not taking: Reported on 03/21/2015)  . [DISCONTINUED] ramipril (ALTACE) 5 MG capsule Take 5 mg by mouth daily. Reported on 03/21/2015   . [DISCONTINUED] senna-docusate (SENOKOT-S) 8.6-50 MG tablet Take 1 tablet by mouth 2 (two) times daily. (Patient not taking: Reported on 03/21/2015)   No facility-administered encounter medications on file as of 08/22/2015.     Functional Status: In your present state of health, do you have any difficulty performing the following activities: 08/22/2015 02/24/2015  Hearing? N N  Vision? N N  Difficulty concentrating or making decisions? N N  Walking or climbing stairs? N N  Dressing or bathing? N N  Doing errands, shopping? N -  Some recent data might be hidden    Fall/Depression Screening: PHQ 2/9 Scores 08/22/2015 03/21/2015 09/05/2014  PHQ - 2 Score 0 0 0     Assessment:  Diabetes: Most recent A1C was 8.4% which is at at goal of less than 7%. Weight is stable from last visit with link to wellness. Having frequent diarrhea almost every day with stools 3-4 times per day.  Has stopped metformin before on the weekends without complete resolution of diarrhea.  Also has history of diverticulitis and removal of 12 inches of her colon which may be contributing to diarrhea. ASCVD 10 year risk 7.3% not currently on a statin. She has not been checking her blood glucose over the past 2 weeks due to meter not working anymore   Plan/Goals for Next Visit: -Instructed patient to try taking morning dose of metformin with lunch since it is usually a larger meal to see if that helps with diarrhea.  Patient to followup with primary care provider if frequent diarrhea continues.  -Will contact patients pharmacy for new blood glucose meter.  Patient to continue checking blood glucose twice daily -Counseled on importance of exercising and strategies to increase exercise to a goal of 150 minutes/week. -Counseled on low carbohydrate diet, portion sizes, plate method and recommended carbohydrate intake per meal. Provided patient with low carbohydrate snacks handout.  -Patient set goal of 10-15 lb weight loss over  the next 90 days and to increase walking to 30 minutes per day during lunch and at home.   Next appointment to see me is: 11/21/15 at 8 am   Bennye Alm, PharmD [facility] PGY2 Pharmacy Resident [phone]  [facility] CM Care Plan Problem One   Flowsheet Row Most Recent Value  Care Plan Problem One  Knowledge Deficit of diabetes control related to nutrition/exercise management as evidenced by patient requests  Role Documenting the Problem One  Clinical Pharmacist  Care Plan for Problem One  Active  [facility] Long Term Goal (31-90 days)  Patient will achive a weight loss of 10-15 lbs over the next 90 days as evidenced by weight on scale  [facility] Long Term Goal Start Date  08/22/15  Interventions for Problem One Long Term Goal  Counseled on low carbohydrate diet, portion sizes, plate method and recommended carbohydrate intake per meal.   [facility] CM Short Term Goal #1 (0-30 days)  Patient will start walking 30 minutes per day including her walking at home and during lunch as evidenced by patient report  [facility] CM Short Term Goal #1 Start Date  08/22/15  Interventions for Short Term Goal #1  Counseled on  importance of exercising and strategies to increase exercise to a goal of 150 minutes/week

## 2015-08-28 DIAGNOSIS — E213 Hyperparathyroidism, unspecified: Secondary | ICD-10-CM | POA: Diagnosis not present

## 2015-08-28 DIAGNOSIS — E1165 Type 2 diabetes mellitus with hyperglycemia: Secondary | ICD-10-CM | POA: Diagnosis not present

## 2015-08-28 DIAGNOSIS — K5792 Diverticulitis of intestine, part unspecified, without perforation or abscess without bleeding: Secondary | ICD-10-CM | POA: Diagnosis not present

## 2015-08-28 DIAGNOSIS — E785 Hyperlipidemia, unspecified: Secondary | ICD-10-CM | POA: Diagnosis not present

## 2015-08-28 DIAGNOSIS — K219 Gastro-esophageal reflux disease without esophagitis: Secondary | ICD-10-CM | POA: Diagnosis not present

## 2015-08-28 DIAGNOSIS — E669 Obesity, unspecified: Secondary | ICD-10-CM | POA: Diagnosis not present

## 2015-08-28 DIAGNOSIS — E039 Hypothyroidism, unspecified: Secondary | ICD-10-CM | POA: Diagnosis not present

## 2015-08-28 DIAGNOSIS — I10 Essential (primary) hypertension: Secondary | ICD-10-CM | POA: Diagnosis not present

## 2015-08-28 DIAGNOSIS — E784 Other hyperlipidemia: Secondary | ICD-10-CM | POA: Diagnosis not present

## 2015-08-29 ENCOUNTER — Encounter: Payer: Self-pay | Admitting: Gynecology

## 2015-08-29 ENCOUNTER — Ambulatory Visit (INDEPENDENT_AMBULATORY_CARE_PROVIDER_SITE_OTHER): Payer: 59 | Admitting: Gynecology

## 2015-08-29 VITALS — BP 124/74 | Ht 64.0 in | Wt 201.0 lb

## 2015-08-29 DIAGNOSIS — N952 Postmenopausal atrophic vaginitis: Secondary | ICD-10-CM

## 2015-08-29 DIAGNOSIS — Z01419 Encounter for gynecological examination (general) (routine) without abnormal findings: Secondary | ICD-10-CM

## 2015-08-29 DIAGNOSIS — N893 Dysplasia of vagina, unspecified: Secondary | ICD-10-CM | POA: Diagnosis not present

## 2015-08-29 NOTE — Progress Notes (Signed)
    Alexis [name] 03/15/1955 [id number]        60 y.o.  [id number]  for annual exam.  Doing well.  Past medical history,surgical history, problem list, medications, allergies, family history and social history were all reviewed and documented as reviewed in the EPIC chart.  ROS:  Performed with pertinent positives and negatives included in the history, assessment and plan.   Additional significant findings :  None   Exam: [name] assistant Vitals:   08/29/15 1432  BP: 124/74  Weight: 201 lb (91.2 kg)  Height: 5\' 4"  (1.626 m)   Body mass index is 34.5 kg/m.  General appearance:  Normal affect, orientation and appearance. Skin: Grossly normal HEENT: Without gross lesions.  No cervical or supraclavicular adenopathy. Thyroid normal.  Lungs:  Clear without wheezing, rales or rhonchi Cardiac: RR, without RMG Abdominal:  Soft, nontender, without masses, guarding, rebound, organomegaly or hernia Breasts:  Examined lying and sitting without masses, retractions, discharge or axillary adenopathy. Pelvic:  Ext/BUS/Vagina with atrophic changes. Pap smear of cuff done  Adnexa without masses or tenderness    Anus and perineum normal   Rectovaginal normal sphincter tone without palpated masses or tenderness.    Assessment/Plan:  60 y.o. [id number] female for annual exam.   1. Postmenopausal/atrophic genital changes. Status post LAVH 2004 for leiomyoma/menorrhagia. Doing well without significant hot flushes, night sweats, vaginal dryness. 2. 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 Pap smear/HPV 2016 negative. Pap smear done today. Will do several annual Pap smears and then increase screening interval. 3. Mammography due now and patient knows to call schedule. SBE monthly reviewed. 4. Colonoscopy 2014. Repeat at their recommended interval. 5. DEXA never. Will plan further into menopause. 6. Health maintenance. No routine blood work done as this is done elsewhere. Follow up 1  year, sooner as needed.    [name] MD, 2:55 PM 08/29/2015

## 2015-08-29 NOTE — Patient Instructions (Signed)

## 2015-08-29 NOTE — Addendum Note (Signed)
Addended by: Nelva Nay on: 08/29/2015 03:05 PM   Modules accepted: Orders

## 2015-09-01 LAB — PAP IG W/ RFLX HPV ASCU

## 2015-10-09 MED FILL — JARDIANCE 10 MG TABLET: 10 | 30 days supply | Qty: 30 | Fill #0

## 2015-10-09 MED FILL — FLUCONAZOLE 100 MG TABLET: 100 | 30 days supply | Qty: 30 | Fill #0

## 2015-10-10 MED FILL — UNIFINE PENTIPS 32GX5/32: 32G X 4 MM | 90 days supply | Qty: 100 | Fill #0

## 2015-10-10 MED FILL — VICTOZA 18 MG/3 ML INJECT P: 18 | 90 days supply | Qty: 27 | Fill #3

## 2015-10-10 MED FILL — UNIFINE PENTIPS 32GX5/32": 32G X 4 MM | 90 days supply | Qty: 100 | Fill #0

## 2015-10-10 MED FILL — CONTOUR NEXT STRIPS: 50 days supply | Qty: 100 | Fill #1

## 2015-10-20 MED FILL — METFORMIN HCL ER 500 MG TAB: 500 | 90 days supply | Qty: 360 | Fill #2

## 2015-10-20 MED FILL — PHENTERMINE 37.5 MG TABLET: 37.5 | 90 days supply | Qty: 90 | Fill #2

## 2015-10-20 NOTE — Progress Notes (Signed)
I reviewed this encounter

## 2015-11-07 DIAGNOSIS — E784 Other hyperlipidemia: Secondary | ICD-10-CM | POA: Diagnosis not present

## 2015-11-07 DIAGNOSIS — I10 Essential (primary) hypertension: Secondary | ICD-10-CM | POA: Diagnosis not present

## 2015-11-07 DIAGNOSIS — K219 Gastro-esophageal reflux disease without esophagitis: Secondary | ICD-10-CM | POA: Diagnosis not present

## 2015-11-07 DIAGNOSIS — E1165 Type 2 diabetes mellitus with hyperglycemia: Secondary | ICD-10-CM | POA: Diagnosis not present

## 2015-11-12 MED FILL — LANTUS SOLOSTAR 100 UNITS/M: 100 | 41 days supply | Qty: 15 | Fill #0

## 2015-11-21 ENCOUNTER — Other Ambulatory Visit: Payer: Self-pay | Admitting: Pharmacist

## 2015-12-19 ENCOUNTER — Encounter: Payer: Self-pay | Admitting: Pharmacist

## 2015-12-19 NOTE — Patient Outreach (Signed)
[facility]) Care Management  [place]   11/21/2015  Alexis [name] [birth year] [id number]  Subjective: Patient presents today for 3 month diabetes follow-up as part of the employer-sponsored Link to Wellness program.  Current diabetes regimen includes Victoza 1.8 mg daily, Lantus 20 units at bedtime, and metformin 500 mg twice daily.  Patient also continues on daily aspirin.  Most recent MD follow-up was ~11/07/15.  Patient is currently self titrating her insulin until she achieves fasting blood glucose <150 mg/dL per her provider recommendations.   Patient reported dietary habits: Reports adherence to low carbohydrate diet ~50-75% of the time.  She reports she is avoiding soft drinks and drinking only unsweet tea.   Patient reported exercise habits: Walking 15-20 minutes per day.  Is trying to walk more at work and often now takes the stairs.  Patient denies hypoglycemic events. Reports proper treatment knowledge Patient reports nocturia once per day.  Patient denies pain/burning upon urination.  Patient denies neuropathy. Patient denies visual changes. Patient reports self foot exams. Denies changes  Patient reported self monitored blood glucose frequency twice daily Home fasting CBG: 209, 220, 261, 224, 266, 220, 222 mg/dL  2 hour post-prandial/random CBG: 194, 210, 215, 205, 248, 305, 318 mg/dL  Objective:  08/28/2015 A1C 9.2%  Lab Results  Component Value Date   HGBA1C 8.4 (H) 02/23/2015   Vitals:   11/21/15 0830  BP: 132/78  Pulse: 68     Lipid Panel  Lipid Panel completed 02/20/2015 HDL 50.000 02/20/2015 LDL 110.000 02/20/2015 Cholesterol, total 183.000 02/20/2015 Triglycerides 112.000 02/20/2015  10 year ASCVD risk: 7.3%  Encounter Medications: Outpatient Encounter Prescriptions as of 11/21/2015  Medication Sig  . aspirin 81 MG tablet Take 81 mg by mouth every other day.   . Calcium Carbonate-Vitamin D (CALCIUM-VITAMIN D) 500-200  MG-UNIT tablet Take 1 tablet by mouth daily.  . Cetirizine HCl (ZYRTEC ALLERGY PO) Take 1 tablet by mouth every morning.   . insulin glargine (LANTUS) 100 UNIT/ML injection Inject 20 Units into the skin at bedtime.  Marland Kitchen levothyroxine (SYNTHROID, LEVOTHROID) 25 MCG tablet Take 25 mcg by mouth every morning.   . Liraglutide (VICTOZA) 18 MG/3ML SOPN Inject 1.8 mg into the skin every morning.   . metFORMIN (GLUMETZA) 500 MG (MOD) 24 hr tablet Take 500 mg by mouth 2 (two) times daily.   . pantoprazole (PROTONIX) 40 MG tablet Take 40 mg by mouth daily.  . fluconazole (DIFLUCAN) 150 MG tablet Take 1 tablet (150 mg total) by mouth daily. Take once as needed. (Patient not taking: Reported on 11/21/2015)  . [DISCONTINUED] repaglinide (PRANDIN) 1 MG tablet Take 1 mg by mouth 2 (two) times daily before a meal.   No facility-administered encounter medications on file as of 11/21/2015.     Functional Status: In your present state of health, do you have any difficulty performing the following activities: 08/22/2015 02/24/2015  Hearing? N N  Vision? N N  Difficulty concentrating or making decisions? N N  Walking or climbing stairs? N N  Dressing or bathing? N N  Doing errands, shopping? N -  Some recent data might be hidden    Fall/Depression Screening: PHQ 2/9 Scores 08/22/2015 03/21/2015 09/05/2014  PHQ - 2 Score 0 0 0     Assessment: Diabetes: Most recent A1C was 9.2% which is at above goal of less than 7%. Weight is decreased from last visit with me. Patient is not currently on statin with ASCVD risk of 7.3% nor ACE  inhibitor with unknown microalbumin/creatinine ratio.     Plan/Goals for Next Visit: Counseled on signs/symptoms and treatment of hypoglycemia Patient will continue to self titrate her Lantus until fasting blood glucose target is achieved per provider recommendation Discussed low carbohydrate diet and increasing exercise to goal of 150 minutes per week.  Will recommend trial of  moderate intensity Crestor 10 mg daily as patient has 10 year ASCVD risk of 7.3% with diabetes.  Patient has history of myalgias with one unknown statin but rosuvastatin is least likely to cause myalgias.   Will also recommend Microalbumin/SCr monitoring to determine if patient is candidate for low dose ACE inhibitor for renal protection. Have recommended patient obtain yearly eye exam.   Patient has enrolled in new employee sponsored benefit for her Diabetes called [employer].   Next appointment to see me is: 3 months via telephone   Bennye Alm, PharmD [facility] PGY2 Pharmacy Resident [phone]  [facility] CM Care Plan Problem One   Flowsheet Row Most Recent Value  Care Plan Problem One  Knowledge Deficit of diabetes control related to nutrition/exercise management as evidenced by patient requests  Role Documenting the Problem One  Clinical Pharmacist  Care Plan for Problem One  Active  THN Long Term Goal (31-90 days)  Patient will achive a weight loss of 10-15 lbs over the next 90 days as evidenced by weight on scale  THN Long Term Goal Start Date  08/22/15  Interventions for Problem One Long Term Goal  Counseled on low carbohydrate diet, portion sizes, plate method and recommended carbohydrate intake per meal.   THN CM Short Term Goal #1 (0-30 days)  Patient will start walking 30 minutes per day including her walking at home and during lunch as evidenced by patient report  THN CM Short Term Goal #1 Start Date  08/22/15  Interventions for Short Term Goal #1  Counseled on importance of exercising and strategies to increase exercise to a goal of 150 minutes/week

## 2015-12-22 MED FILL — CONTOUR NEXT STRIPS: 50 days supply | Qty: 100 | Fill #2

## 2015-12-22 MED FILL — LANTUS SOLOSTAR 100 UNITS/M: 100 | 41 days supply | Qty: 15 | Fill #1

## 2015-12-24 MED FILL — AMOXICILLIN 500 MG CAPSULE: 500 | 10 days supply | Qty: 40 | Fill #0

## 2015-12-24 MED FILL — metroNIDAZOLE 500 MG TABS: 500 | 10 days supply | Qty: 20 | Fill #0

## 2015-12-24 MED FILL — ONDANSETRON HCL 4 MG TABLET: 4 | 10 days supply | Qty: 60 | Fill #0

## 2016-01-22 MED FILL — UNIFINE PENTIPS 32GX5/32: 32G X 4 MM | 90 days supply | Qty: 200 | Fill #0

## 2016-01-22 MED FILL — UNIFINE PENTIPS 32GX5/32": 32G X 4 MM | 90 days supply | Qty: 200 | Fill #0

## 2016-01-23 MED FILL — VICTOZA 18 MG/3 ML INJECT P: 18 | 90 days supply | Qty: 27 | Fill #0

## 2016-01-26 MED FILL — PHENTERMINE 37.5 MG TABLET: 37.5 | 90 days supply | Qty: 90 | Fill #0

## 2016-02-02 MED FILL — LANTUS SOLOSTAR 100 UNITS/M: 100 | 41 days supply | Qty: 15 | Fill #2

## 2016-02-24 MED FILL — OSELTAMIVIR PHOSPHATE 75 MG: 75 | 10 days supply | Qty: 10 | Fill #0

## 2016-03-01 DIAGNOSIS — E782 Mixed hyperlipidemia: Secondary | ICD-10-CM | POA: Diagnosis not present

## 2016-03-01 DIAGNOSIS — E559 Vitamin D deficiency, unspecified: Secondary | ICD-10-CM | POA: Diagnosis not present

## 2016-03-01 DIAGNOSIS — E109 Type 1 diabetes mellitus without complications: Secondary | ICD-10-CM | POA: Diagnosis not present

## 2016-03-01 DIAGNOSIS — E6609 Other obesity due to excess calories: Secondary | ICD-10-CM | POA: Diagnosis not present

## 2016-03-01 DIAGNOSIS — J301 Allergic rhinitis due to pollen: Secondary | ICD-10-CM | POA: Diagnosis not present

## 2016-03-01 DIAGNOSIS — K219 Gastro-esophageal reflux disease without esophagitis: Secondary | ICD-10-CM | POA: Diagnosis not present

## 2016-03-01 DIAGNOSIS — E039 Hypothyroidism, unspecified: Secondary | ICD-10-CM | POA: Diagnosis not present

## 2016-03-01 DIAGNOSIS — Z6833 Body mass index (BMI) 33.0-33.9, adult: Secondary | ICD-10-CM | POA: Diagnosis not present

## 2016-03-01 DIAGNOSIS — M5136 Other intervertebral disc degeneration, lumbar region: Secondary | ICD-10-CM | POA: Diagnosis not present

## 2016-03-04 DIAGNOSIS — Z0100 Encounter for examination of eyes and vision without abnormal findings: Secondary | ICD-10-CM | POA: Diagnosis not present

## 2016-03-09 MED FILL — LANTUS SOLOSTAR 100 UNITS/M: 100 | 41 days supply | Qty: 15 | Fill #3

## 2016-03-12 DIAGNOSIS — E782 Mixed hyperlipidemia: Secondary | ICD-10-CM | POA: Diagnosis not present

## 2016-03-12 DIAGNOSIS — M5136 Other intervertebral disc degeneration, lumbar region: Secondary | ICD-10-CM | POA: Diagnosis not present

## 2016-03-12 DIAGNOSIS — E109 Type 1 diabetes mellitus without complications: Secondary | ICD-10-CM | POA: Diagnosis not present

## 2016-03-12 DIAGNOSIS — K219 Gastro-esophageal reflux disease without esophagitis: Secondary | ICD-10-CM | POA: Diagnosis not present

## 2016-03-19 ENCOUNTER — Ambulatory Visit: Payer: Self-pay | Admitting: Pharmacist

## 2016-03-23 ENCOUNTER — Other Ambulatory Visit: Payer: Self-pay | Admitting: Pharmacist

## 2016-03-23 NOTE — Patient Outreach (Signed)
[facility]) Care Management  03/23/2016  Alexis [name] 1955/01/16 [id number]  61 y.o. year old female previously seen in the [place] sponsored Diabetes Link to Wellness program.  Patient has been transitioned to the new employee sponsored [employer] Diabetes program.  Patient will be continued to be followed via the [facility] application.  Bennye Alm, PharmD, [place] PGY2 Pharmacy Resident [phone]

## 2016-04-02 ENCOUNTER — Encounter: Payer: Self-pay | Admitting: Pharmacist

## 2016-04-02 MED FILL — VIT D2 1.25 MG (50,000 UNIT: 1.25 MG | 84 days supply | Qty: 24 | Fill #0

## 2016-04-15 MED FILL — VICTOZA 18 MG/3 ML INJECT P: 18 | 90 days supply | Qty: 27 | Fill #1

## 2016-04-15 MED FILL — LANTUS SOLOSTAR 100 UNITS/M: 100 | 41 days supply | Qty: 15 | Fill #4

## 2016-04-16 MED FILL — METFORMIN HCL ER 500 MG TAB: 500 | 30 days supply | Qty: 120 | Fill #0

## 2016-04-16 MED FILL — UNIFINE PENTIPS 32GX5/32": 32G X 4 MM | 90 days supply | Qty: 200 | Fill #1

## 2016-04-16 MED FILL — UNIFINE PENTIPS 32GX5/32: 32G X 4 MM | 90 days supply | Qty: 200 | Fill #1

## 2016-04-22 MED FILL — PHENTERMINE 37.5 MG TABLET: 37.5 | 90 days supply | Qty: 90 | Fill #0

## 2016-05-24 MED FILL — LANTUS SOLOSTAR 100 UNITS/M: 100 | 41 days supply | Qty: 15 | Fill #5

## 2016-05-27 DIAGNOSIS — Z6833 Body mass index (BMI) 33.0-33.9, adult: Secondary | ICD-10-CM | POA: Diagnosis not present

## 2016-05-27 DIAGNOSIS — K219 Gastro-esophageal reflux disease without esophagitis: Secondary | ICD-10-CM | POA: Diagnosis not present

## 2016-05-27 DIAGNOSIS — J301 Allergic rhinitis due to pollen: Secondary | ICD-10-CM | POA: Diagnosis not present

## 2016-05-27 DIAGNOSIS — E6609 Other obesity due to excess calories: Secondary | ICD-10-CM | POA: Diagnosis not present

## 2016-05-27 DIAGNOSIS — M5136 Other intervertebral disc degeneration, lumbar region: Secondary | ICD-10-CM | POA: Diagnosis not present

## 2016-05-27 DIAGNOSIS — E559 Vitamin D deficiency, unspecified: Secondary | ICD-10-CM | POA: Diagnosis not present

## 2016-05-27 DIAGNOSIS — E109 Type 1 diabetes mellitus without complications: Secondary | ICD-10-CM | POA: Diagnosis not present

## 2016-05-27 DIAGNOSIS — E782 Mixed hyperlipidemia: Secondary | ICD-10-CM | POA: Diagnosis not present

## 2016-05-27 DIAGNOSIS — E039 Hypothyroidism, unspecified: Secondary | ICD-10-CM | POA: Diagnosis not present

## 2016-06-23 ENCOUNTER — Encounter: Payer: 59 | Attending: *Deleted | Admitting: Registered"

## 2016-06-23 ENCOUNTER — Encounter: Payer: Self-pay | Admitting: Registered"

## 2016-06-23 DIAGNOSIS — E118 Type 2 diabetes mellitus with unspecified complications: Secondary | ICD-10-CM

## 2016-06-23 DIAGNOSIS — Z029 Encounter for administrative examinations, unspecified: Secondary | ICD-10-CM | POA: Diagnosis present

## 2016-06-23 NOTE — Progress Notes (Signed)
Diabetes Self-Management Education  Visit Type: First/Initial  Appt. Start Time: 1010 Appt. End Time: 1110  06/23/2016  [name], identified by name and date of birth, is a 61 y.o. female with a diagnosis of Diabetes: Type 2.   ASSESSMENT Patient states she would like to learn more about carbs and how to eat to manage her diabetes.  Pt states diverticulis was identified with a colonoscopy. Pt states she had surgery to remove a polyp and 12" of colon was removed. Pt now avoids eating nuts, seeds, and popcorn. Pt states she has also had parathyroid tumor removed.  Pt states she enjoys fruit, but will often run out at home because her daughter and grandchildren live with her and she has a hard time keeping fruit stocked.  Pt states she has had a lot of stress over the last several years due to work stress and several health issues. Pt reports that her doctor believes stress contributed to her A1c rise and the insulin may be only needed temporarily to bring down BG to manageable levels.     Diabetes Self-Management Education - 06/23/16 1028      Visit Information   Visit Type First/Initial     Initial Visit   Diabetes Type Type 2   Are you currently following a meal plan? No   Are you taking your medications as prescribed? Yes   Date Diagnosed 10 yrs     Health Coping   How would you rate your overall health? Good     Psychosocial Assessment   Patient Belief/Attitude about Diabetes Motivated to manage diabetes   How often do you need to have someone help you when you read instructions, pamphlets, or other written materials from your doctor or pharmacy? 1 - Never   What is the last grade level you completed in school? college 1     Complications   Last HgB A1C per patient/outside source 7.2 %  pt reported end of 2017   How often do you check your blood sugar? 1-2 times/day   Fasting Blood glucose range (mg/dL) --  150 highest times   Postprandial Blood glucose  range (mg/dL) --  bedtime, 120-130   Number of hypoglycemic episodes per month 0   Number of hyperglycemic episodes per week 0   Have you had a dilated eye exam in the past 12 months? Yes   Have you had a dental exam in the past 12 months? Yes   Are you checking your feet? Yes   How many days per week are you checking your feet? 7     Dietary Intake   Breakfast frozen waffle, cream cheese, water   Snack (morning) none OR nabs OR fruit   Lunch cafeteria OR salad, chicken OR [place] salad   Snack (afternoon) none OR cracker   Dinner meat, veggies, sometimes starch OR hotdogs, slaw, chili   Snack (evening) cheese, cracker OR deli meat & cheese   Beverage(s) water, unsweetened tea     Exercise   Exercise Type Light (walking / raking leaves)   How many days per week to you exercise? 2  sometimes more days, walks with corworkers   How many minutes per day do you exercise? 20   Total minutes per week of exercise 40     Patient Education   Previous Diabetes Education No   Nutrition management  Role of diet in the treatment of diabetes and the relationship between the three main macronutrients and blood glucose level;Food  label reading, portion sizes and measuring food.;Carbohydrate counting;Reviewed blood glucose goals for pre and post meals and how to evaluate the patients' food intake on their blood glucose level.   Monitoring Taught/discussed recording of test results and interpretation of SMBG.  Dawn Effect and purpose of medication   Psychosocial adjustment Role of stress on diabetes     Individualized Goals (developed by patient)   Nutrition General guidelines for healthy choices and portions discussed   Monitoring  test my blood glucose as discussed     Outcomes   Expected Outcomes Demonstrated interest in learning. Expect positive outcomes   Future DMSE 4-6 wks   Program Status Not Completed    Individualized Plan for Diabetes Self-Management Training:   Learning Objective:   Patient will have a greater understanding of diabetes self-management. Patient education plan is to attend individual and/or group sessions per assessed needs and concerns.   Patient Instructions   Aim to switch up breakfast. Eggs and peanut butter for protein ideas.  Check BG 2 hrs after a meal  Start taking peanut butter to work again Ideas for stress reduction: 8-week on-line mindful meditation course. 21-day stress reduction - oprah & deepak chopra   Great work on getting in some activity during lunch break!  Expected Outcomes:  Demonstrated interest in learning. Expect positive outcomes  Education material provided: Living Well with Diabetes  If problems or questions, patient to contact team via:  Phone and MyChart  Future DSME appointment: 4-6 wks

## 2016-06-23 NOTE — Patient Instructions (Addendum)
   Aim to switch up breakfast. Eggs and peanut butter for protein ideas.  Check BG 2 hrs after a meal  Start taking peanut butter to work again Ideas for stress reduction: 8-week on-line mindful meditation course. 21-day stress reduction - oprah & deepak chopra   Great work on getting in some activity during lunch break!

## 2016-07-06 MED FILL — LANTUS SOLOSTAR 100 UNITS/M: 100 | 41 days supply | Qty: 15 | Fill #6

## 2016-07-27 MED FILL — PHENTERMINE 37.5 MG TABLET: 37.5 | 30 days supply | Qty: 30 | Fill #0

## 2016-07-27 MED FILL — FLUCONAZOLE 100 MG TABLET: 100 | 10 days supply | Qty: 10 | Fill #0

## 2016-07-27 MED FILL — AMOXICILLIN 500 MG CAPSULE: 500 | 10 days supply | Qty: 40 | Fill #0

## 2016-07-27 MED FILL — ACCU-CHEK FASTCLIX LANCETS: 90 days supply | Qty: 102 | Fill #0

## 2016-07-27 MED FILL — metroNIDAZOLE 500 MG TABS: 500 | 10 days supply | Qty: 20 | Fill #0

## 2016-07-27 MED FILL — ACCU-CHEK GUIDE TEST STRIP: 50 days supply | Qty: 50 | Fill #0

## 2016-08-02 ENCOUNTER — Encounter (HOSPITAL_COMMUNITY): Payer: Self-pay | Admitting: Emergency Medicine

## 2016-08-02 ENCOUNTER — Emergency Department (HOSPITAL_COMMUNITY): Payer: 59

## 2016-08-02 ENCOUNTER — Emergency Department (HOSPITAL_COMMUNITY)
Admission: EM | Admit: 2016-08-02 | Discharge: 2016-08-02 | Disposition: A | Discharge status: Home / Self Care | Payer: 59 | Attending: Emergency Medicine | Admitting: Emergency Medicine

## 2016-08-02 DIAGNOSIS — R079 Chest pain, unspecified: Secondary | ICD-10-CM | POA: Diagnosis not present

## 2016-08-02 DIAGNOSIS — I10 Essential (primary) hypertension: Secondary | ICD-10-CM | POA: Diagnosis not present

## 2016-08-02 DIAGNOSIS — E119 Type 2 diabetes mellitus without complications: Secondary | ICD-10-CM | POA: Diagnosis not present

## 2016-08-02 DIAGNOSIS — Z7982 Long term (current) use of aspirin: Secondary | ICD-10-CM | POA: Insufficient documentation

## 2016-08-02 DIAGNOSIS — Z794 Long term (current) use of insulin: Secondary | ICD-10-CM | POA: Diagnosis not present

## 2016-08-02 DIAGNOSIS — M549 Dorsalgia, unspecified: Secondary | ICD-10-CM | POA: Diagnosis not present

## 2016-08-02 DIAGNOSIS — Z79899 Other long term (current) drug therapy: Secondary | ICD-10-CM | POA: Diagnosis not present

## 2016-08-02 DIAGNOSIS — E039 Hypothyroidism, unspecified: Secondary | ICD-10-CM | POA: Diagnosis not present

## 2016-08-02 DIAGNOSIS — M546 Pain in thoracic spine: Secondary | ICD-10-CM | POA: Diagnosis not present

## 2016-08-02 LAB — HEPATIC FUNCTION PANEL
ALT: 27 U/L (ref 14–54)
AST: 27 U/L (ref 15–41)
Albumin: 4 g/dL (ref 3.5–5.0)
Alkaline Phosphatase: 77 U/L (ref 38–126)
Bilirubin, Direct: 0.1 mg/dL (ref 0.1–0.5)
Indirect Bilirubin: 0.8 mg/dL (ref 0.3–0.9)
Total Bilirubin: 0.9 mg/dL (ref 0.3–1.2)
Total Protein: 7 g/dL (ref 6.5–8.1)

## 2016-08-02 LAB — LIPASE, BLOOD: Lipase: 48 U/L (ref 11–51)

## 2016-08-02 LAB — I-STAT TROPONIN, ED
Troponin i, poc: 0.02 ng/mL (ref 0.00–0.08)
Troponin i, poc: 0 ng/mL (ref 0.00–0.08)

## 2016-08-02 LAB — BASIC METABOLIC PANEL
Anion gap: 9 (ref 5–15)
BUN: 9 mg/dL (ref 6–20)
CO2: 24 mmol/L (ref 22–32)
Calcium: 9.1 mg/dL (ref 8.9–10.3)
Chloride: 104 mmol/L (ref 101–111)
Creatinine, Ser: 0.61 mg/dL (ref 0.44–1.00)
GFR calc Af Amer: >60 mL/min (ref >60)
GFR calc non Af Amer: >60 mL/min (ref >60)
Glucose, Bld: 242 mg/dL — ABNORMAL HIGH (ref 65–99)
Potassium: 4 mmol/L (ref 3.5–5.1)
Sodium: 137 mmol/L (ref 135–145)

## 2016-08-02 LAB — CBC
HCT: 41.7 % (ref 36.0–46.0)
Hemoglobin: 13.8 g/dL (ref 12.0–15.0)
MCH: 28.4 pg (ref 26.0–34.0)
MCHC: 33.1 g/dL (ref 30.0–36.0)
MCV: 85.8 fL (ref 78.0–100.0)
Platelets: 218 10*3/uL (ref 150–400)
RBC: 4.86 MIL/uL (ref 3.87–5.11)
RDW: 14.3 % (ref 11.5–15.5)
WBC: 8 10*3/uL (ref 4.0–10.5)

## 2016-08-02 LAB — PROTIME-INR
INR: 1.01
Prothrombin Time: 13.3 seconds (ref 11.4–15.2)

## 2016-08-02 MED ORDER — IOPAMIDOL (ISOVUE-370) INJECTION 76%
INTRAVENOUS | Status: AC
  Administered 2016-08-02: 100 mL
  Filled 2016-08-02: qty 100

## 2016-08-02 MED ORDER — MORPHINE SULFATE (PF) 4 MG/ML IV SOLN
2.0000 mg | Freq: Once | INTRAVENOUS | Status: AC
  Administered 2016-08-02: 2 mg via INTRAVENOUS
  Filled 2016-08-02: qty 1

## 2016-08-02 NOTE — ED Notes (Signed)
Pt did not need anything at this time  

## 2016-08-02 NOTE — ED Notes (Signed)
This RN gave 2 mg Morphine to Jonni Sanger, Software engineer, to waste.

## 2016-08-02 NOTE — ED Triage Notes (Signed)
Pt. Stated, I started having pain between my shoulder blades since last Thursday.  I started having some off and on chest pain and my left arm feels so tight.

## 2016-08-02 NOTE — Discharge Instructions (Signed)
Please recheck with your doctor this week. Please return if you are worse at any time especially increased pain, shortness of breath, or fever.

## 2016-08-02 NOTE — ED Notes (Addendum)
Pt in X ray

## 2016-08-02 NOTE — ED Provider Notes (Signed)
[facility] DEPT Provider Note   CSN: [id number] Arrival date & time: 08/02/16  9562     History   Chief Complaint Chief Complaint  Patient presents with  . Back Pain  . Chest Pain    HPI Alexis [name] is a 61 y.o. female.  HPI  This 61 year old female with a history of type 2 diabetes, status post cholecystectomy, history of hyperparathyroidism status post surgery presents today complaining of right upper back pain "between her shoulder blades" that began last Thursday. It is sharp in nature. It is worsening in frequency and severity. She states it is worse at night when she lays down. She has been unable to sleep. She is also somewhat worse with deep breathing. She has some associated dyspnea. She states that she has had some squeezing discomfort in her left elbow intermittently. She has also had some substernal chest pain that has preceded this was correlated with it. Not had similar symptoms in past. She denies any lateralized leg swelling, history of DVT, or PE. She is not anticoagulated.  Past Medical History:  Diagnosis Date  . Diabetes mellitus without complication ([place])   . Diverticulitis   . Facial trauma    forehead,nose and upper lip secondary to fall   . Fall   . History of bronchitis   . History of urinary tract infection   . Hyperparathyroidism ([place])   . Hypertension   . Hypothyroidism   . Kidney stones   . Parathyroid tumor   . PONV (postoperative nausea and vomiting)    emotional when awakens / cries  . 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 Yeast infection    history of     Patient Active Problem List   Diagnosis Date Noted  . CAP (community acquired pneumonia) 02/23/2015  . Primary hyperparathyroidism ([place]) 02/21/2015  . Hyperlipidemia 02/05/2014  . Essential hypertension 02/05/2014  . Diabetes ([place]) 10/18/2013    Past Surgical History:  Procedure Laterality Date  . ABDOMINAL HYSTERECTOMY      . APPENDECTOMY    . CARPAL TUNNEL RELEASE     right and left   . CHOLECYSTECTOMY    . COLON SURGERY    . colonscopy     . DILATION AND CURETTAGE OF UTERUS    . HYSTEROSCOPY  2003  . LAVH  2004   leiomyoma/irregular bleeding  . PARATHYROIDECTOMY N/A 02/21/2015   Procedure: RIGHT PARATHYROIDECTOMY;  Surgeon: [name], MD;  Location: [facility] ORS;  Service: General;  Laterality: N/A;  . TUBAL LIGATION      OB History    Gravida Para Term Preterm AB Living   3 2 2   1 2    SAB TAB Ectopic Multiple Live Births   1               Home Medications    Prior to Admission medications   Medication Sig Start Date End Date Taking? Authorizing Provider  aspirin 81 MG tablet Take 81 mg by mouth every other day.     [provider]  Calcium Carbonate-Vitamin D (CALCIUM-VITAMIN D) 500-200 MG-UNIT tablet Take 1 tablet by mouth daily.    [provider]  Cetirizine HCl (ZYRTEC ALLERGY PO) Take 1 tablet by mouth every morning.     [provider]  fluconazole (DIFLUCAN) 150 MG tablet Take 1 tablet (150 mg total) by mouth daily. Take once as needed. Patient not taking: Reported on 11/21/2015 02/24/15   Irine Seal  V, MD  insulin glargine (LANTUS) 100 UNIT/ML injection Inject 40 Units into the skin at bedtime.     [provider]  levothyroxine (SYNTHROID, LEVOTHROID) 25 MCG tablet Take 25 mcg by mouth every morning.     [provider]  Liraglutide (VICTOZA) 18 MG/3ML SOPN Inject 1.8 mg into the skin every morning.     [provider]  metFORMIN (GLUMETZA) 500 MG (MOD) 24 hr tablet Take 500 mg by mouth 2 (two) times daily.     [provider]  pantoprazole (PROTONIX) 40 MG tablet Take 40 mg by mouth daily.    [provider]    Family History Family History  Problem Relation Age of Onset  . Heart attack Father   . Diabetes Brother   . Cancer Brother        Lung cancer    Social History Social History  Substance Use  Topics  . Smoking status: Never Smoker  . Smokeless tobacco: Never Used  . Alcohol use No     Allergies   Canagliflozin; Empagliflozin; Metformin hcl; and Parlodel [bromocriptine mesylate]   Review of Systems Review of Systems  All other systems reviewed and are negative.    Physical Exam Updated Vital Signs BP (!) 157/55 (BP Location: Right Arm)   Pulse 80   Temp 97.9 F (36.6 C) (Oral)   Resp 16   Ht 1.657 m (5' 5.25")   Wt 89.8 kg (198 lb)   BMI 32.70 kg/m   Physical Exam  Constitutional: She is oriented to person, place, and time. She appears well-developed and well-nourished.  HENT:  Head: Normocephalic and atraumatic.  Eyes: Pupils are equal, round, and reactive to light. EOM are normal.  Neck: Normal range of motion.  Cardiovascular: Normal rate and regular rhythm.   Pulmonary/Chest: Effort normal and breath sounds normal.  Abdominal: Soft. Bowel sounds are normal.  Musculoskeletal: Normal range of motion.  Neurological: She is alert and oriented to person, place, and time.  Skin: Skin is warm and dry. Capillary refill takes less than 2 seconds.  Psychiatric: She has a normal mood and affect.     ED Treatments / Results  Labs (all labs ordered are listed, but only abnormal results are displayed) Labs Reviewed  BASIC METABOLIC PANEL - Abnormal; Notable for the following:       Result Value   Glucose, Bld 242 (*)    All other components within normal limits  CBC  HEPATIC FUNCTION PANEL  LIPASE, BLOOD  PROTIME-INR  I-STAT TROPONIN, ED    EKG  EKG Interpretation  Date/Time:  Monday August 02 2016 08:37:24 EDT Ventricular Rate:  85 PR Interval:  150 QRS Duration: 82 QT Interval:  372 QTC Calculation: 442 R Axis:   69 Text Interpretation:  Normal sinus rhythm Normal ECG Confirmed by Pattricia Boss [phone]) on 08/02/2016 9:12:11 AM       Radiology Dg Chest 2 View  Result Date: 08/02/2016 CLINICAL DATA:  Posterior upper chest pain. Chest  tightness and left arm pain. EXAM: CHEST  2 VIEW COMPARISON:  02/23/2015 FINDINGS: Lungs are clear without airspace disease or pulmonary edema. Heart and mediastinum are within normal limits. The trachea is midline. No large pleural effusions. Bridging osteophytes in the thoracic spine. Evidence for cholecystectomy clips. Surgical clips in the lower right neck region. IMPRESSION: No active cardiopulmonary disease. Electronically Signed   By: Markus Daft M.D.   On: 08/02/2016 09:11   Ct Angio Chest Pe W And/or Wo  Contrast  Result Date: 08/02/2016 CLINICAL DATA:  Pain between shoulder blades since Thursday. Often on chest pain and left arm tightness. EXAM: CT ANGIOGRAPHY CHEST WITH CONTRAST TECHNIQUE: Multidetector CT imaging of the chest was performed using the standard protocol during bolus administration of intravenous contrast. Multiplanar CT image reconstructions and MIPs were obtained to evaluate the vascular anatomy. CONTRAST:  60 cc Isovue 370 COMPARISON:  None. FINDINGS: Cardiovascular: There is no pulmonary embolism identified within the main, lobar or segmental pulmonary arteries bilaterally. No aortic aneurysm or evidence of aortic dissection. Heart size is normal. No pericardial effusion. Mediastinum/Nodes: No mass or enlarged lymph nodes within the mediastinum or perihilar regions. Thyroid gland is prominent in size, but without discrete lesion. Esophagus appears normal. Trachea and central bronchi are unremarkable. Lungs/Pleura: Mild scarring/atelectasis at the left lung base. Lungs otherwise clear. No pleural effusion or pneumothorax. Upper Abdomen: Limited images of the upper abdomen are unremarkable. Musculoskeletal: Mild degenerative spurring within the slightly kyphotic/scoliotic thoracic spine. No acute or suspicious osseous finding. Superficial soft tissues are unremarkable. Review of the MIP images confirms the above findings. IMPRESSION: 1. No acute findings. No pulmonary embolism. No aortic  aneurysm. No evidence of pneumonia. 2. Prominent thyroid gland, without discrete mass or lesion. Recommend further characterization with nonemergent thyroid ultrasound. Electronically Signed   By: Franki Cabot M.D.   On: 08/02/2016 10:48    Procedures Procedures (including critical care time)  Medications Ordered in ED Medications  morphine 4 MG/ML injection 2 mg (not administered)     Initial Impression / Assessment and Plan / ED Course  I have reviewed the triage vital signs and the nursing notes.  Pertinent labs & imaging results that were available during my care of the patient were reviewed by me and considered in my medical decision making (see chart for details).     This is a 61 year old female with some right upper back pain which is worse with lying down. Evaluation here included EKG and cardiac enzymes. She had a CT angiogram of her chest which revealed no pulmonary embolism, aneurysm of her aorta, or dissection of her aorta. Patient had a repeat troponin which is normal. She feels improved after morphine. I discussed return precautions and need for close follow-up with the patient and she voices understanding.  Final Clinical Impressions(s) / ED Diagnoses   Final diagnoses:  Upper back pain on right side    New Prescriptions New Prescriptions   No medications on file     Pattricia Boss, MD 08/02/16 1231

## 2016-08-05 DIAGNOSIS — E039 Hypothyroidism, unspecified: Secondary | ICD-10-CM | POA: Diagnosis not present

## 2016-08-05 DIAGNOSIS — E6609 Other obesity due to excess calories: Secondary | ICD-10-CM | POA: Diagnosis not present

## 2016-08-05 DIAGNOSIS — M5136 Other intervertebral disc degeneration, lumbar region: Secondary | ICD-10-CM | POA: Diagnosis not present

## 2016-08-05 DIAGNOSIS — K219 Gastro-esophageal reflux disease without esophagitis: Secondary | ICD-10-CM | POA: Diagnosis not present

## 2016-08-05 DIAGNOSIS — Z7984 Long term (current) use of oral hypoglycemic drugs: Secondary | ICD-10-CM | POA: Diagnosis not present

## 2016-08-05 DIAGNOSIS — E109 Type 1 diabetes mellitus without complications: Secondary | ICD-10-CM | POA: Diagnosis not present

## 2016-08-05 DIAGNOSIS — M454 Ankylosing spondylitis of thoracic region: Secondary | ICD-10-CM | POA: Diagnosis not present

## 2016-08-05 DIAGNOSIS — G894 Chronic pain syndrome: Secondary | ICD-10-CM | POA: Diagnosis not present

## 2016-08-06 ENCOUNTER — Ambulatory Visit: Payer: Self-pay | Admitting: Registered"

## 2016-08-13 MED FILL — LANTUS SOLOSTAR 100 UNITS/M: 100 | 41 days supply | Qty: 15 | Fill #7

## 2016-08-19 ENCOUNTER — Ambulatory Visit: Payer: Self-pay | Admitting: Registered"

## 2016-08-26 DIAGNOSIS — E559 Vitamin D deficiency, unspecified: Secondary | ICD-10-CM | POA: Diagnosis not present

## 2016-08-26 DIAGNOSIS — E109 Type 1 diabetes mellitus without complications: Secondary | ICD-10-CM | POA: Diagnosis not present

## 2016-08-26 DIAGNOSIS — Z6833 Body mass index (BMI) 33.0-33.9, adult: Secondary | ICD-10-CM | POA: Diagnosis not present

## 2016-08-26 DIAGNOSIS — K219 Gastro-esophageal reflux disease without esophagitis: Secondary | ICD-10-CM | POA: Diagnosis not present

## 2016-08-26 DIAGNOSIS — M5136 Other intervertebral disc degeneration, lumbar region: Secondary | ICD-10-CM | POA: Diagnosis not present

## 2016-08-26 DIAGNOSIS — E039 Hypothyroidism, unspecified: Secondary | ICD-10-CM | POA: Diagnosis not present

## 2016-08-26 DIAGNOSIS — E6609 Other obesity due to excess calories: Secondary | ICD-10-CM | POA: Diagnosis not present

## 2016-08-26 DIAGNOSIS — E782 Mixed hyperlipidemia: Secondary | ICD-10-CM | POA: Diagnosis not present

## 2016-08-26 DIAGNOSIS — J301 Allergic rhinitis due to pollen: Secondary | ICD-10-CM | POA: Diagnosis not present

## 2016-08-26 MED FILL — PHENTERMINE 37.5 MG TABLET: 37.5 | 30 days supply | Qty: 30 | Fill #1

## 2016-09-03 ENCOUNTER — Encounter: Payer: 59 | Admitting: Gynecology

## 2016-09-10 ENCOUNTER — Other Ambulatory Visit: Payer: Self-pay | Admitting: Gynecology

## 2016-09-10 ENCOUNTER — Ambulatory Visit (INDEPENDENT_AMBULATORY_CARE_PROVIDER_SITE_OTHER): Payer: 59 | Admitting: Gynecology

## 2016-09-10 ENCOUNTER — Encounter: Payer: Self-pay | Admitting: Gynecology

## 2016-09-10 VITALS — BP 124/78 | Ht 63.0 in | Wt 202.0 lb

## 2016-09-10 DIAGNOSIS — E210 Primary hyperparathyroidism: Secondary | ICD-10-CM | POA: Diagnosis not present

## 2016-09-10 DIAGNOSIS — Z1231 Encounter for screening mammogram for malignant neoplasm of breast: Secondary | ICD-10-CM

## 2016-09-10 DIAGNOSIS — N952 Postmenopausal atrophic vaginitis: Secondary | ICD-10-CM

## 2016-09-10 DIAGNOSIS — Z01411 Encounter for gynecological examination (general) (routine) with abnormal findings: Secondary | ICD-10-CM | POA: Diagnosis not present

## 2016-09-10 NOTE — Addendum Note (Signed)
Addended by: Nelva Nay on: 09/10/2016 03:57 PM   Modules accepted: Orders

## 2016-09-10 NOTE — Patient Instructions (Addendum)
Follow up for your bone density as scheduled.  Call to Schedule your mammogram  Facilities in [place]: 1)  [facility]. [address] [place] Phone: [phone] 2)  Dr. Isaiah Blakes at [facility] [address] Phone: [phone]     Mammogram A mammogram is an X-ray test to find changes in a woman's breast. You should get a mammogram if:  You are 62 years of age or older  You have risk factors.   Your doctor recommends that you have one.  BEFORE THE TEST  Do not schedule the test the week before your period, especially if your breasts are sore during this time.  On the day of your mammogram:  Wash your breasts and armpits well. After washing, do not put on any deodorant or talcum powder on until after your test.   Eat and drink as you usually do.   Take your medicines as usual.   If you are diabetic and take insulin, make sure you:   Eat before coming for your test.   Take your insulin as usual.   If you cannot keep your appointment, call before the appointment to cancel. Schedule another appointment.  TEST  You will need to undress from the waist up. You will put on a hospital gown.   Your breast will be put on the mammogram machine, and it will press firmly on your breast with a piece of plastic called a compression paddle. This will make your breast flatter so that the machine can X-ray all parts of your breast.   Both breasts will be X-rayed. Each breast will be X-rayed from above and from the side. An X-ray might need to be taken again if the picture is not good enough.   The mammogram will last about 15 to 30 minutes.  AFTER THE TEST Finding out the results of your test Ask when your test results will be ready. Make sure you get your test results.  Document Released: 03/26/2008 Document Revised: 12/17/2010 Document Reviewed: 03/26/2008 [facility] Patient Information 2012 [place].

## 2016-09-10 NOTE — Progress Notes (Signed)
    Alexis [name] [birth year] [id number]        61 y.o.  [id number] for annual exam.  Former patient of Dr. Toney Rakes  Past medical history,surgical history, problem list, medications, allergies, family history and social history were all reviewed and documented as reviewed in the EPIC chart.  ROS:  Performed with pertinent positives and negatives included in the history, assessment and plan.   Additional significant findings :  None   Exam: Alexis [name] assistant Vitals:   09/10/16 1523  BP: 124/78  Weight: 202 lb (91.6 kg)  Height: 5\' 3"  (1.6 m)   Body mass index is 35.78 kg/m.  General appearance:  Normal affect, orientation and appearance. Skin: Grossly normal HEENT: Without gross lesions.  No cervical or supraclavicular adenopathy. Thyroid normal.  Lungs:  Clear without wheezing, rales or rhonchi Cardiac: RR, without RMG Abdominal:  Soft, nontender, without masses, guarding, rebound, organomegaly or hernia Breasts:  Examined lying and sitting without masses, retractions, discharge or axillary adenopathy. Pelvic:  Ext, BUS, Vagina: With atrophic changes  Adnexa: Without masses or tenderness    Anus and perineum: Normal   Rectovaginal: Normal sphincter tone without palpated masses or tenderness.    Assessment/Plan:  61 y.o. [id number] female for annual exam.   1. Status post LAVH 2004 for leiomyoma/menorrhagia. No significant hot flushes, night sweats or vaginal dryness. 2. 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 Pap smear/HPV 2016 negative. Pap smear 2017 negative. Pap smear done today. If negative then plan less frequent screening intervals. 3. Mammography 2016. I reminded patient she is overdue and she agrees to call and schedule. Breast exam normal today. 4. Colonoscopy 2014. Repeat at their recommended interval. 5. DEXA never. She does have a history of parathyroid adenoma and being followed by her endocrinologist. Will check baseline DEXA now. Patient will  schedule follow up for this. 6. Health maintenance. No routine blood work done as patient does this elsewhere. Follow up 1 year, sooner as needed.   Alexis Auerbach MD, 3:47 PM 09/10/2016

## 2016-09-11 DIAGNOSIS — R87610 Atypical squamous cells of undetermined significance on cytologic smear of cervix (ASC-US): Secondary | ICD-10-CM

## 2016-09-11 HISTORY — DX: Atypical squamous cells of undetermined significance on cytologic smear of cervix (ASC-US): R87.610

## 2016-09-14 ENCOUNTER — Encounter: Payer: Self-pay | Admitting: Gynecology

## 2016-09-14 ENCOUNTER — Other Ambulatory Visit: Payer: Self-pay | Admitting: Gynecology

## 2016-09-14 ENCOUNTER — Ambulatory Visit (INDEPENDENT_AMBULATORY_CARE_PROVIDER_SITE_OTHER): Payer: 59

## 2016-09-14 DIAGNOSIS — E210 Primary hyperparathyroidism: Secondary | ICD-10-CM

## 2016-09-14 DIAGNOSIS — Z13820 Encounter for screening for osteoporosis: Secondary | ICD-10-CM | POA: Diagnosis not present

## 2016-09-14 DIAGNOSIS — R87610 Atypical squamous cells of undetermined significance on cytologic smear of cervix (ASC-US): Secondary | ICD-10-CM | POA: Diagnosis not present

## 2016-09-14 DIAGNOSIS — Z01411 Encounter for gynecological examination (general) (routine) with abnormal findings: Secondary | ICD-10-CM | POA: Diagnosis not present

## 2016-09-16 LAB — PAP IG W/ RFLX HPV ASCU

## 2016-09-16 MED FILL — metFORMIN HCL 500 MG TABS: 500 | 90 days supply | Qty: 180 | Fill #0

## 2016-09-16 MED FILL — ACCU-CHEK GUIDE TEST STRIP: 50 days supply | Qty: 50 | Fill #1

## 2016-09-16 MED FILL — LANTUS SOLOSTAR 100 UNITS/M: 100 | 41 days supply | Qty: 15 | Fill #8

## 2016-09-17 DIAGNOSIS — Z8639 Personal history of other endocrine, nutritional and metabolic disease: Secondary | ICD-10-CM | POA: Diagnosis not present

## 2016-09-17 DIAGNOSIS — Z6834 Body mass index (BMI) 34.0-34.9, adult: Secondary | ICD-10-CM | POA: Diagnosis not present

## 2016-09-17 DIAGNOSIS — N200 Calculus of kidney: Secondary | ICD-10-CM | POA: Diagnosis not present

## 2016-09-17 DIAGNOSIS — E785 Hyperlipidemia, unspecified: Secondary | ICD-10-CM | POA: Diagnosis not present

## 2016-09-17 DIAGNOSIS — E1165 Type 2 diabetes mellitus with hyperglycemia: Secondary | ICD-10-CM | POA: Diagnosis not present

## 2016-09-17 DIAGNOSIS — E538 Deficiency of other specified B group vitamins: Secondary | ICD-10-CM | POA: Diagnosis not present

## 2016-09-17 DIAGNOSIS — R197 Diarrhea, unspecified: Secondary | ICD-10-CM | POA: Diagnosis not present

## 2016-09-17 LAB — HUMAN PAPILLOMAVIRUS, HIGH RISK: HPV DNA High Risk: NOT DETECTED

## 2016-09-20 ENCOUNTER — Encounter: Payer: Self-pay | Admitting: Gynecology

## 2016-09-20 MED FILL — VICTOZA 18 MG/3 ML INJECT P: 18 | 30 days supply | Qty: 9 | Fill #0

## 2016-09-23 ENCOUNTER — Ambulatory Visit: Payer: Self-pay

## 2016-09-24 MED FILL — PHENTERMINE 37.5 MG TABLET: 37.5 | 30 days supply | Qty: 30 | Fill #2

## 2016-10-14 MED FILL — AMOXICILLIN 500 MG CAPSULE: 500 | 10 days supply | Qty: 40 | Fill #0

## 2016-10-14 MED FILL — LANTUS SOLOSTAR 100 UNITS/M: 100 | 33 days supply | Qty: 18 | Fill #0

## 2016-10-14 MED FILL — metroNIDAZOLE 500 MG TABS: 500 | 10 days supply | Qty: 20 | Fill #0

## 2016-10-14 MED FILL — FLUCONAZOLE 100 MG TABLET: 100 | 10 days supply | Qty: 10 | Fill #0

## 2016-10-22 MED FILL — PHENTERMINE 37.5 MG TABLET: 37.5 | 30 days supply | Qty: 30 | Fill #3

## 2016-11-18 DIAGNOSIS — R110 Nausea: Secondary | ICD-10-CM | POA: Diagnosis not present

## 2016-11-18 DIAGNOSIS — R140 Abdominal distension (gaseous): Secondary | ICD-10-CM | POA: Diagnosis not present

## 2016-11-18 DIAGNOSIS — R109 Unspecified abdominal pain: Secondary | ICD-10-CM | POA: Diagnosis not present

## 2016-11-18 MED FILL — OSCIMIN SR 0.375 MG TABLET: 0.375 | 30 days supply | Qty: 60 | Fill #0

## 2016-11-18 MED FILL — VICTOZA 18 MG/3 ML INJECT P: 18 | 30 days supply | Qty: 9 | Fill #1

## 2016-11-18 MED FILL — LANTUS SOLOSTAR 100 UNITS/M: 100 | 33 days supply | Qty: 18 | Fill #1

## 2016-11-25 DIAGNOSIS — K219 Gastro-esophageal reflux disease without esophagitis: Secondary | ICD-10-CM | POA: Diagnosis not present

## 2016-11-25 DIAGNOSIS — E559 Vitamin D deficiency, unspecified: Secondary | ICD-10-CM | POA: Diagnosis not present

## 2016-11-25 DIAGNOSIS — Z6833 Body mass index (BMI) 33.0-33.9, adult: Secondary | ICD-10-CM | POA: Diagnosis not present

## 2016-11-25 DIAGNOSIS — J301 Allergic rhinitis due to pollen: Secondary | ICD-10-CM | POA: Diagnosis not present

## 2016-11-25 DIAGNOSIS — E782 Mixed hyperlipidemia: Secondary | ICD-10-CM | POA: Diagnosis not present

## 2016-11-25 DIAGNOSIS — E109 Type 1 diabetes mellitus without complications: Secondary | ICD-10-CM | POA: Diagnosis not present

## 2016-11-25 DIAGNOSIS — E6609 Other obesity due to excess calories: Secondary | ICD-10-CM | POA: Diagnosis not present

## 2016-11-25 DIAGNOSIS — E039 Hypothyroidism, unspecified: Secondary | ICD-10-CM | POA: Diagnosis not present

## 2016-11-25 DIAGNOSIS — M5136 Other intervertebral disc degeneration, lumbar region: Secondary | ICD-10-CM | POA: Diagnosis not present

## 2016-11-25 MED FILL — LEVOTHYROXINE 25 MCG TABLET: 25 | 90 days supply | Qty: 90 | Fill #0

## 2016-11-25 MED FILL — VIT D2 1.25 MG (50,000 UNIT: 1.25 MG | 84 days supply | Qty: 24 | Fill #0

## 2016-11-26 MED FILL — PHENTERMINE 37.5 MG TABLET: 37.5 | 30 days supply | Qty: 30 | Fill #0

## 2016-12-06 ENCOUNTER — Encounter (HOSPITAL_COMMUNITY): Payer: Self-pay | Admitting: Emergency Medicine

## 2016-12-06 ENCOUNTER — Emergency Department (HOSPITAL_COMMUNITY): Payer: 59

## 2016-12-06 ENCOUNTER — Other Ambulatory Visit: Payer: Self-pay

## 2016-12-06 ENCOUNTER — Emergency Department (HOSPITAL_COMMUNITY)
Admission: EM | Admit: 2016-12-06 | Discharge: 2016-12-06 | Disposition: A | Discharge status: Home / Self Care | Payer: 59 | Attending: Emergency Medicine | Admitting: Emergency Medicine

## 2016-12-06 DIAGNOSIS — E039 Hypothyroidism, unspecified: Secondary | ICD-10-CM | POA: Insufficient documentation

## 2016-12-06 DIAGNOSIS — K529 Noninfective gastroenteritis and colitis, unspecified: Secondary | ICD-10-CM | POA: Insufficient documentation

## 2016-12-06 DIAGNOSIS — Z79899 Other long term (current) drug therapy: Secondary | ICD-10-CM | POA: Insufficient documentation

## 2016-12-06 DIAGNOSIS — I10 Essential (primary) hypertension: Secondary | ICD-10-CM | POA: Insufficient documentation

## 2016-12-06 DIAGNOSIS — E1165 Type 2 diabetes mellitus with hyperglycemia: Secondary | ICD-10-CM | POA: Insufficient documentation

## 2016-12-06 DIAGNOSIS — R109 Unspecified abdominal pain: Secondary | ICD-10-CM | POA: Diagnosis not present

## 2016-12-06 DIAGNOSIS — R197 Diarrhea, unspecified: Secondary | ICD-10-CM | POA: Diagnosis not present

## 2016-12-06 DIAGNOSIS — R739 Hyperglycemia, unspecified: Secondary | ICD-10-CM

## 2016-12-06 DIAGNOSIS — R112 Nausea with vomiting, unspecified: Secondary | ICD-10-CM | POA: Insufficient documentation

## 2016-12-06 DIAGNOSIS — R1010 Upper abdominal pain, unspecified: Secondary | ICD-10-CM | POA: Diagnosis not present

## 2016-12-06 DIAGNOSIS — Z7982 Long term (current) use of aspirin: Secondary | ICD-10-CM | POA: Insufficient documentation

## 2016-12-06 DIAGNOSIS — R1111 Vomiting without nausea: Secondary | ICD-10-CM | POA: Diagnosis not present

## 2016-12-06 DIAGNOSIS — R110 Nausea: Secondary | ICD-10-CM | POA: Diagnosis not present

## 2016-12-06 DIAGNOSIS — Z794 Long term (current) use of insulin: Secondary | ICD-10-CM | POA: Insufficient documentation

## 2016-12-06 DIAGNOSIS — R1084 Generalized abdominal pain: Secondary | ICD-10-CM

## 2016-12-06 DIAGNOSIS — R1110 Vomiting, unspecified: Secondary | ICD-10-CM | POA: Diagnosis not present

## 2016-12-06 LAB — COMPREHENSIVE METABOLIC PANEL
ALT: 25 U/L (ref 14–54)
AST: 20 U/L (ref 15–41)
Albumin: 4.9 g/dL (ref 3.5–5.0)
Alkaline Phosphatase: 111 U/L (ref 38–126)
Anion gap: 12 (ref 5–15)
BUN: 22 mg/dL — ABNORMAL HIGH (ref 6–20)
CO2: 17 mmol/L — ABNORMAL LOW (ref 22–32)
Calcium: 9.9 mg/dL (ref 8.9–10.3)
Chloride: 102 mmol/L (ref 101–111)
Creatinine, Ser: 1.09 mg/dL — ABNORMAL HIGH (ref 0.44–1.00)
GFR calc Af Amer: >60 mL/min (ref >60)
GFR calc non Af Amer: 53 mL/min — ABNORMAL LOW (ref >60)
Glucose, Bld: 324 mg/dL — ABNORMAL HIGH (ref 65–99)
Potassium: 4.5 mmol/L (ref 3.5–5.1)
Sodium: 131 mmol/L — ABNORMAL LOW (ref 135–145)
Total Bilirubin: 1.5 mg/dL — ABNORMAL HIGH (ref 0.3–1.2)
Total Protein: 9 g/dL — ABNORMAL HIGH (ref 6.5–8.1)

## 2016-12-06 LAB — CBC WITH DIFFERENTIAL/PLATELET
Basophils Absolute: 0 10*3/uL (ref 0.0–0.1)
Basophils Relative: 0 %
Eosinophils Absolute: 0 10*3/uL (ref 0.0–0.7)
Eosinophils Relative: 0 %
HCT: 51.6 % — ABNORMAL HIGH (ref 36.0–46.0)
Hemoglobin: 16.5 g/dL — ABNORMAL HIGH (ref 12.0–15.0)
Lymphocytes Relative: 10 %
Lymphs Abs: 1.7 10*3/uL (ref 0.7–4.0)
MCH: 28 pg (ref 26.0–34.0)
MCHC: 32 g/dL (ref 30.0–36.0)
MCV: 87.6 fL (ref 78.0–100.0)
Monocytes Absolute: 0.7 10*3/uL (ref 0.1–1.0)
Monocytes Relative: 4 %
Neutro Abs: 14.8 10*3/uL — ABNORMAL HIGH (ref 1.7–7.7)
Neutrophils Relative %: 86 %
Platelets: 330 10*3/uL (ref 150–400)
RBC: 5.89 MIL/uL — ABNORMAL HIGH (ref 3.87–5.11)
RDW: 14 % (ref 11.5–15.5)
WBC: 17.2 10*3/uL — ABNORMAL HIGH (ref 4.0–10.5)

## 2016-12-06 LAB — URINALYSIS, ROUTINE W REFLEX MICROSCOPIC
Glucose, UA: 150 mg/dL — AB
Hgb urine dipstick: NEGATIVE
Ketones, ur: 5 mg/dL — AB
Leukocytes, UA: NEGATIVE
Nitrite: NEGATIVE
Protein, ur: 100 mg/dL — AB
Specific Gravity, Urine: 1.028 (ref 1.005–1.030)
pH: 5 (ref 5.0–8.0)

## 2016-12-06 LAB — CBG MONITORING, ED
Glucose-Capillary: 334 mg/dL — ABNORMAL HIGH (ref 65–99)
Glucose-Capillary: 250 mg/dL — ABNORMAL HIGH (ref 65–99)

## 2016-12-06 LAB — LIPASE, BLOOD: Lipase: 27 U/L (ref 11–51)

## 2016-12-06 MED ORDER — IOPAMIDOL (ISOVUE-300) INJECTION 61%
100.0000 mL | Freq: Once | INTRAVENOUS | Status: AC | PRN
  Administered 2016-12-06: 100 mL via INTRAVENOUS

## 2016-12-06 MED ORDER — ONDANSETRON 4 MG PO TBDP: 4.0000 mg | ORAL_TABLET | Freq: Three times a day (TID) | ORAL | 0 refills | Status: DC | PRN

## 2016-12-06 MED ORDER — ONDANSETRON HCL 4 MG/2ML IJ SOLN
4.0000 mg | Freq: Once | INTRAMUSCULAR | Status: AC
  Administered 2016-12-06: 4 mg via INTRAVENOUS
  Filled 2016-12-06: qty 2

## 2016-12-06 MED ORDER — SODIUM CHLORIDE 0.9 % IV BOLUS (SEPSIS)
1000.0000 mL | Freq: Once | INTRAVENOUS | Status: AC
  Administered 2016-12-06: 1000 mL via INTRAVENOUS

## 2016-12-06 NOTE — ED Triage Notes (Signed)
PT c/o upper abdominal pain with nausea and vomiting x2 days. EMS states CBG 355 and pt forgot to take her Victoza injection this am.

## 2016-12-06 NOTE — ED Provider Notes (Signed)
[facility] EMERGENCY DEPARTMENT Provider Note   CSN: [id number] Arrival date & time: 12/06/16  1324     History   Chief Complaint Chief Complaint  Patient presents with  . Abdominal Pain    HPI Alexis [name] is a 61 y.o. female.  HPI Patient presents with nausea vomiting and diarrhea for 2 days.  Loose stools.  Has had some green emesis also.  Mild crampy upper abdominal pain.  She is on Victoza but did not take it this morning because she been vomiting.  No fevers.  No sick contacts.  No blood in the stool or emesis.  She has had previous colon surgery for precancerous lesions. Past Medical History:  Diagnosis Date  . ASCUS of cervix with negative high risk HPV 09/2016  . Diabetes mellitus without complication ([place])   . Diverticulitis   . Facial trauma    forehead,nose and upper lip secondary to fall   . Fall   . History of bronchitis   . History of urinary tract infection   . Hyperparathyroidism ([place])   . Hypertension   . Hypothyroidism   . Kidney stones   . Parathyroid tumor   . PONV (postoperative nausea and vomiting)    emotional when awakens / cries  . 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 Yeast infection    history of     Patient Active Problem List   Diagnosis Date Noted  . CAP (community acquired pneumonia) 02/23/2015  . Primary hyperparathyroidism ([place]) 02/21/2015  . Hyperlipidemia 02/05/2014  . Essential hypertension 02/05/2014  . Diabetes ([place]) 10/18/2013    Past Surgical History:  Procedure Laterality Date  . ABDOMINAL HYSTERECTOMY    . APPENDECTOMY    . CARPAL TUNNEL RELEASE     right and left   . CHOLECYSTECTOMY    . COLON SURGERY    . colonscopy     . DILATION AND CURETTAGE OF UTERUS    . HYSTEROSCOPY  2003  . LAVH  2004   leiomyoma/irregular bleeding  . PARATHYROIDECTOMY N/A 02/21/2015   Procedure: RIGHT PARATHYROIDECTOMY;  Surgeon: [name], MD;  Location: [facility] ORS;  Service:  General;  Laterality: N/A;  . TUBAL LIGATION      OB History    Gravida Para Term Preterm AB Living   3 2 2   1 2    SAB TAB Ectopic Multiple Live Births   1               Home Medications    Prior to Admission medications   Medication Sig Start Date End Date Taking? Authorizing Provider  aspirin 81 MG tablet Take 81 mg by mouth every other day.    Yes [provider]  HYDROcodone-acetaminophen (NORCO) 7.5-325 MG tablet Take 1 tablet by mouth every 6 (six) hours as needed for moderate pain.  11/22/16  Yes [provider]  insulin glargine (LANTUS) 100 UNIT/ML injection Inject 44 Units into the skin at bedtime.    Yes [provider]  levothyroxine (SYNTHROID, LEVOTHROID) 25 MCG tablet Take 25 mcg by mouth every morning.    Yes [provider]  Liraglutide (VICTOZA) 18 MG/3ML SOPN Inject 1.8 mg into the skin every morning.    Yes [provider]  OSCIMIN SR 0.375 MG 12 hr tablet Take 0.375 mg by mouth every 12 (twelve) hours. 11/18/16  Yes [provider]  phentermine (ADIPEX-P) 37.5 MG tablet Take 37.5 mg by mouth daily.  11/26/16  Yes [provider]  Probiotic Product (ALIGN) 4 MG CAPS Take 1 capsule by mouth daily.   Yes [provider]  Vitamin D, Ergocalciferol, (DRISDOL) 50000 units CAPS capsule Take 50,000 Units by mouth 2 (two) times a week. Sunday-Wednesday 11/25/16  Yes [provider]  ondansetron (ZOFRAN-ODT) 4 MG disintegrating tablet Take 1 tablet (4 mg total) by mouth every 8 (eight) hours as needed for nausea or vomiting. 12/06/16   Davonna Belling, MD    Family History Family History  Problem Relation Age of Onset  . Heart attack Father   . Diabetes Brother   . Cancer Brother        Lung cancer    Social History Social History   Tobacco Use  . Smoking status: Never Smoker  . Smokeless tobacco: Never Used  Substance Use Topics  . Alcohol use: No    Alcohol/week: 0.0 oz  .  Drug use: No     Allergies   Canagliflozin; Empagliflozin; Metformin hcl; and Parlodel [bromocriptine mesylate]   Review of Systems Review of Systems  Constitutional: Negative for appetite change.  HENT: Negative for congestion.   Respiratory: Negative for shortness of breath.   Cardiovascular: Negative for chest pain.  Gastrointestinal: Positive for abdominal pain, diarrhea, nausea and vomiting. Negative for constipation.  Endocrine: Positive for polydipsia.  Genitourinary: Negative for flank pain.  Musculoskeletal: Negative for back pain.  Neurological: Negative for weakness and light-headedness.  Hematological: Negative for adenopathy.     Physical Exam Updated Vital Signs BP (!) 142/64 (BP Location: Left Arm)   Pulse 78   Temp 98.3 F (36.8 C) (Oral)   Resp 20   Ht 5\' 4"  (1.626 m)   Wt 90.7 kg (200 lb)   SpO2 94%   BMI 34.33 kg/m   Physical Exam  Constitutional: She appears well-developed.  HENT:  Head: Normocephalic and atraumatic.  Eyes: Pupils are equal, round, and reactive to light.  Cardiovascular: Normal rate.  Pulmonary/Chest: Breath sounds normal.  Abdominal: There is no rebound and no guarding. No hernia.  Minimal abdominal tenderness without hernias palpated.  Neurological: She is alert.  Skin: Skin is warm. Capillary refill takes less than 2 seconds.  Psychiatric: She has a normal mood and affect.     ED Treatments / Results  Labs (all labs ordered are listed, but only abnormal results are displayed) Labs Reviewed  CBC WITH DIFFERENTIAL/PLATELET - Abnormal; Notable for the following components:      Result Value   WBC 17.2 (*)    RBC 5.89 (*)    Hemoglobin 16.5 (*)    HCT 51.6 (*)    Neutro Abs 14.8 (*)    All other components within normal limits  COMPREHENSIVE METABOLIC PANEL - Abnormal; Notable for the following components:   Sodium 131 (*)    CO2 17 (*)    Glucose, Bld 324 (*)    BUN 22 (*)    Creatinine, Ser 1.09 (*)    Total  Protein 9.0 (*)    Total Bilirubin 1.5 (*)    GFR calc non Af Amer 53 (*)    All other components within normal limits  URINALYSIS, ROUTINE W REFLEX MICROSCOPIC - Abnormal; Notable for the following components:   Color, Urine AMBER (*)    APPearance CLOUDY (*)    Glucose, UA 150 (*)    Bilirubin Urine SMALL (*)    Ketones, ur 5 (*)    Protein, ur 100 (*)  Bacteria, UA RARE (*)    Squamous Epithelial / LPF 6-30 (*)    All other components within normal limits  CBG MONITORING, ED - Abnormal; Notable for the following components:   Glucose-Capillary 334 (*)    All other components within normal limits  CBG MONITORING, ED - Abnormal; Notable for the following components:   Glucose-Capillary 250 (*)    All other components within normal limits  LIPASE, BLOOD    EKG  EKG Interpretation None       Radiology Ct Abdomen Pelvis W Contrast  Result Date: 12/06/2016 CLINICAL DATA:  Upper abdominal pain with nausea and vomiting EXAM: CT ABDOMEN AND PELVIS WITH CONTRAST TECHNIQUE: Multidetector CT imaging of the abdomen and pelvis was performed using the standard protocol following bolus administration of intravenous contrast. CONTRAST:  118mL ISOVUE-300 IOPAMIDOL (ISOVUE-300) INJECTION 61% COMPARISON:  12/06/2016 radiograph, CT 01/20/2012 FINDINGS: Lower chest: Lung bases demonstrate no acute consolidation or effusion. Linear atelectasis or scar at the left base. Hepatobiliary: No focal liver abnormality is seen. Status post cholecystectomy. No biliary dilatation. Pancreas: Unremarkable. No pancreatic ductal dilatation or surrounding inflammatory changes. Spleen: Normal in size without focal abnormality. Adrenals/Urinary Tract: Adrenal glands are within normal limits. Slight interval increase in size of bilateral hypodense renal lesions, likely cysts. Additional subcentimeter hypodensities in both kidneys too small to further characterize. The bladder is normal Stomach/Bowel: The stomach is  nonenlarged. Fluid-filled but nonenlarged loops of small bowel throughout. Suggestion of mild wall thickening of distal small bowel loops. Postsurgical changes at the cecum. Some fluid in the colon. No colon wall thickening Vascular/Lymphatic: No significant vascular findings are present. No enlarged abdominal or pelvic lymph nodes. Reproductive: Status post hysterectomy. No adnexal masses. Other: Negative for free air or significant free fluid. Small fat in the umbilicus. Musculoskeletal: No acute or significant osseous findings. IMPRESSION: 1. Fluid-filled but nonenlarged loops of small bowel with suggestion of mild wall thickening of distal small bowel loops, favor ileus or enteritis type pattern. No definitive obstruction at this time. CT follow-up as clinically indicated. Electronically Signed   By: Donavan Foil M.D.   On: 12/06/2016 20:07   Dg Abd 2 Views  Result Date: 12/06/2016 CLINICAL DATA:  Initial evaluation for acute upper abdominal pain. EXAM: ABDOMEN - 2 VIEW COMPARISON:  Prior CT from 01/20/2012. FINDINGS: Few mildly prominent gas-filled loops of small and large bowel seen within the abdomen. Small loops dilated up to 3.6 cm. Air is seen overlying the rectum. No appreciable bowel wall thickening. No soft tissue mass or abnormal calcification. No free air on upright projection. No overt evidence for obstruction, although an early and/or partial obstructive process would be difficult to exclude. Cholecystectomy clips noted. Visualized osseous structures demonstrate no acute abnormality. IMPRESSION: 1. Few mildly prominent gas filled loops of small and large bowel clustered within the mid abdomen. Findings are nonspecific, and could be reactive in nature due to an underlying enteritis. 2. No overt evidence for obstruction at this time, although an early and/or partial obstructive process could be considered in the correct clinical setting. Electronically Signed   By: Jeannine Boga M.D.    On: 12/06/2016 17:24    Procedures Procedures (including critical care time)  Medications Ordered in ED Medications  sodium chloride 0.9 % bolus 1,000 mL (0 mLs Intravenous Stopped 12/06/16 1822)  ondansetron (ZOFRAN) injection 4 mg (4 mg Intravenous Given 12/06/16 1652)  iopamidol (ISOVUE-300) 61 % injection 100 mL (100 mLs Intravenous Contrast Given 12/06/16 1918)  Initial Impression / Assessment and Plan / ED Course  I have reviewed the triage vital signs and the nursing notes.  Pertinent labs & imaging results that were available during my care of the patient were reviewed by me and considered in my medical decision making (see chart for details).     Patient abdominal pain.  Has had nausea and vomiting for 2 days.  States she has been having episodes of this over the last month however.  Has been seen by primary care for it.  Also some hyperglycemia without DKA.  CT scan done and showed likely enteritis versus ileus.  Patient tolerated orals of the contrast.  Will discharge home.  Final Clinical Impressions(s) / ED Diagnoses   Final diagnoses:  Abdominal pain  Generalized abdominal pain  Hyperglycemia  Enteritis    ED Discharge Orders        Ordered    ondansetron (ZOFRAN-ODT) 4 MG disintegrating tablet  Every 8 hours PRN     12/06/16 2107       Davonna Belling, MD 12/06/16 2145

## 2016-12-06 NOTE — ED Notes (Signed)
Patient transported to CT 

## 2016-12-10 ENCOUNTER — Other Ambulatory Visit: Payer: Self-pay | Admitting: Gastroenterology

## 2016-12-10 DIAGNOSIS — R933 Abnormal findings on diagnostic imaging of other parts of digestive tract: Secondary | ICD-10-CM

## 2016-12-10 DIAGNOSIS — R109 Unspecified abdominal pain: Secondary | ICD-10-CM

## 2016-12-10 DIAGNOSIS — R110 Nausea: Secondary | ICD-10-CM | POA: Diagnosis not present

## 2016-12-17 ENCOUNTER — Ambulatory Visit
Admission: RE | Admit: 2016-12-17 | Discharge: 2016-12-17 | Disposition: A | Discharge status: Home / Self Care | Payer: 59 | Source: Ambulatory Visit | Attending: Gastroenterology | Admitting: Gastroenterology

## 2016-12-17 DIAGNOSIS — R109 Unspecified abdominal pain: Secondary | ICD-10-CM

## 2016-12-17 DIAGNOSIS — K56609 Unspecified intestinal obstruction, unspecified as to partial versus complete obstruction: Secondary | ICD-10-CM | POA: Diagnosis not present

## 2016-12-17 DIAGNOSIS — R933 Abnormal findings on diagnostic imaging of other parts of digestive tract: Secondary | ICD-10-CM

## 2016-12-17 MED ORDER — IOPAMIDOL (ISOVUE-300) INJECTION 61%
125.0000 mL | Freq: Once | INTRAVENOUS | Status: AC | PRN
  Administered 2016-12-17: 125 mL via INTRAVENOUS

## 2016-12-17 MED FILL — VICTOZA 18 MG/3 ML INJECT P: 18 | 30 days supply | Qty: 9 | Fill #2

## 2016-12-17 MED FILL — LANTUS SOLOSTAR 100 UNITS/M: 100 | 33 days supply | Qty: 18 | Fill #2

## 2016-12-23 DIAGNOSIS — Z8639 Personal history of other endocrine, nutritional and metabolic disease: Secondary | ICD-10-CM | POA: Diagnosis not present

## 2016-12-23 DIAGNOSIS — Z6834 Body mass index (BMI) 34.0-34.9, adult: Secondary | ICD-10-CM | POA: Diagnosis not present

## 2016-12-23 DIAGNOSIS — E785 Hyperlipidemia, unspecified: Secondary | ICD-10-CM | POA: Diagnosis not present

## 2016-12-23 DIAGNOSIS — E538 Deficiency of other specified B group vitamins: Secondary | ICD-10-CM | POA: Diagnosis not present

## 2016-12-23 DIAGNOSIS — N200 Calculus of kidney: Secondary | ICD-10-CM | POA: Diagnosis not present

## 2016-12-23 DIAGNOSIS — R197 Diarrhea, unspecified: Secondary | ICD-10-CM | POA: Diagnosis not present

## 2016-12-23 DIAGNOSIS — E1165 Type 2 diabetes mellitus with hyperglycemia: Secondary | ICD-10-CM | POA: Diagnosis not present

## 2016-12-24 MED FILL — PHENTERMINE 37.5 MG TABLET: 37.5 | 30 days supply | Qty: 30 | Fill #1

## 2017-01-11 DIAGNOSIS — C50919 Malignant neoplasm of unspecified site of unspecified female breast: Secondary | ICD-10-CM

## 2017-01-11 DIAGNOSIS — Z923 Personal history of irradiation: Secondary | ICD-10-CM

## 2017-01-11 HISTORY — DX: Malignant neoplasm of unspecified site of unspecified female breast: C50.919

## 2017-01-11 HISTORY — DX: Personal history of irradiation: Z92.3

## 2017-01-17 MED FILL — ACCU-CHEK FASTCLIX LANCETS: 90 days supply | Qty: 306 | Fill #0

## 2017-01-17 MED FILL — LANTUS SOLOSTAR 100 UNITS/M: 100 | 33 days supply | Qty: 18 | Fill #3

## 2017-01-17 MED FILL — ACCU-CHEK GUIDE TEST STRIP: 90 days supply | Qty: 300 | Fill #0

## 2017-01-17 MED FILL — VICTOZA 18 MG/3 ML INJECT P: 18 | 30 days supply | Qty: 9 | Fill #3

## 2017-01-24 MED FILL — PHENTERMINE 37.5 MG TABLET: 37.5 | 30 days supply | Qty: 30 | Fill #2

## 2017-02-15 MED FILL — LANTUS SOLOSTAR 100 UNITS/M: 100 | 33 days supply | Qty: 18 | Fill #4

## 2017-02-15 MED FILL — UNIFINE PENTIPS 31GX3/16": 31G X 5 MM | 90 days supply | Qty: 200 | Fill #0

## 2017-02-15 MED FILL — UNIFINE PENTIPS 31GX3/16: 31G X 5 MM | 90 days supply | Qty: 200 | Fill #0

## 2017-02-15 MED FILL — HUMALOG 100 UNITS/ML KWIKPE: 100 | 30 days supply | Qty: 3 | Fill #0

## 2017-02-21 MED FILL — PHENTERMINE 37.5 MG TABLET: 37.5 | 30 days supply | Qty: 30 | Fill #3

## 2017-02-24 DIAGNOSIS — E109 Type 1 diabetes mellitus without complications: Secondary | ICD-10-CM | POA: Diagnosis not present

## 2017-02-24 DIAGNOSIS — J301 Allergic rhinitis due to pollen: Secondary | ICD-10-CM | POA: Diagnosis not present

## 2017-02-24 DIAGNOSIS — M5136 Other intervertebral disc degeneration, lumbar region: Secondary | ICD-10-CM | POA: Diagnosis not present

## 2017-02-24 DIAGNOSIS — E6609 Other obesity due to excess calories: Secondary | ICD-10-CM | POA: Diagnosis not present

## 2017-02-24 DIAGNOSIS — E782 Mixed hyperlipidemia: Secondary | ICD-10-CM | POA: Diagnosis not present

## 2017-02-24 DIAGNOSIS — K219 Gastro-esophageal reflux disease without esophagitis: Secondary | ICD-10-CM | POA: Diagnosis not present

## 2017-02-24 DIAGNOSIS — E559 Vitamin D deficiency, unspecified: Secondary | ICD-10-CM | POA: Diagnosis not present

## 2017-02-24 DIAGNOSIS — Z6833 Body mass index (BMI) 33.0-33.9, adult: Secondary | ICD-10-CM | POA: Diagnosis not present

## 2017-02-24 DIAGNOSIS — E039 Hypothyroidism, unspecified: Secondary | ICD-10-CM | POA: Diagnosis not present

## 2017-03-08 MED FILL — HUMALOG 100 UNITS/ML KWIKPE: 100 | 30 days supply | Qty: 9 | Fill #0

## 2017-03-14 ENCOUNTER — Encounter: Payer: Self-pay | Admitting: Dietician

## 2017-03-14 ENCOUNTER — Encounter: Payer: 59 | Attending: *Deleted | Admitting: Dietician

## 2017-03-14 DIAGNOSIS — Z713 Dietary counseling and surveillance: Secondary | ICD-10-CM | POA: Insufficient documentation

## 2017-03-14 DIAGNOSIS — E669 Obesity, unspecified: Secondary | ICD-10-CM | POA: Insufficient documentation

## 2017-03-14 DIAGNOSIS — E0810 Diabetes mellitus due to underlying condition with ketoacidosis without coma: Secondary | ICD-10-CM

## 2017-03-14 DIAGNOSIS — E119 Type 2 diabetes mellitus without complications: Secondary | ICD-10-CM | POA: Diagnosis not present

## 2017-03-14 DIAGNOSIS — E7800 Pure hypercholesterolemia, unspecified: Secondary | ICD-10-CM | POA: Insufficient documentation

## 2017-03-14 NOTE — Patient Instructions (Addendum)
Be sure that you are eating at least 30 grams of carbohydrates at each meal. Increase your non-starchy vegetables as tolerated. Decrease your fat intake. Continue to be mindful.  Aim for 2-3 Carb Choices per meal (30-45 grams) +/- 1 either way  Aim for 0-1 Carbs per snack if hungry  Include protein in moderation with your meals and snacks Consider reading food labels for Total Carbohydrate and Fat Grams of foods Consider  increasing your activity level by walking for 30 minutes daily as tolerated Consider checking BG at alternate times per day as directed by MD  Continue taking medication as directed by MD

## 2017-03-14 NOTE — Progress Notes (Signed)
Diabetes Self-Management Education  Visit Type: Follow-up  Appt. Start Time: 1635 Appt. End Time: 3532  03/14/2017  [name], identified by name and date of birth, is a 62 y.o. 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   History includes 29' of colon removed, diverticulosis (cannot tolerate corn, popcorn, nuts, or seeds), parathyroid removed, vitamin D deficiency. Medications include Lantus 54 units q HS and Humalog 10 units bid. Weight today 199 lbs stable for the past 2 months. UBW 185-190 lbs and takes phentermine at times for appetite control.   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  She lives with her daughter, five grandchildren and daughter's boyfriend.   They share shopping and cooking.  She is part of the [employer].  (Option of [place] discussed and patient is to ask one of the program leaders about eligibility.)  ASSESSMENT  Height 5\' 4"  (1.626 m), weight 199 lb (90.3 kg). Body mass index is 34.16 kg/m.  Diabetes Self-Management Education - 03/14/17 1653      Visit Information   Visit Type  Follow-up      Health Coping   How would you rate your overall health?  Good      Psychosocial Assessment   Patient Belief/Attitude about Diabetes  Motivated to manage diabetes    Self-care barriers  None    Self-management support  Doctor's office;Family    Other persons present  Patient    Patient Concerns  Nutrition/Meal planning;Weight Control;Glycemic Control    Special Needs  None    Preferred Learning Style  No preference indicated    Learning Readiness  Ready      Pre-Education Assessment   Patient understands the diabetes disease and treatment process.  Needs Review    Patient understands incorporating nutritional management into lifestyle.  Needs Review    Patient undertands incorporating physical activity into lifestyle.  Needs Review    Patient understands using medications safely.  Needs  Review    Patient understands monitoring blood glucose, interpreting and using results  Needs Review    Patient understands prevention, detection, and treatment of acute complications.  Needs Review    Patient understands prevention, detection, and treatment of chronic complications.  Needs Review    Patient understands how to develop strategies to address psychosocial issues.  Needs Review    Patient understands how to develop strategies to promote health/change behavior.  Needs Review      Complications   Last HgB A1C per patient/outside source  7.8 % 12/23/16    How often do you check your blood sugar?  1-2 times/day    Fasting Blood glucose range (mg/dL)  180-200;>200;130-179    Postprandial Blood glucose range (mg/dL)  130-179    Number of hypoglycemic episodes per month  0    Number of hyperglycemic episodes per week  7    Can you tell when your blood sugar is high?  Yes    What do you do if your blood sugar is high?  drinks more water, walks    Have you had a dilated eye exam in the past 12 months?  Yes    Have you had a dental exam in the past 12 months?  Yes    Are you checking your feet?  Yes    How many days per week are you checking your feet?  5      Dietary Intake   Breakfast  Nutrigrain bar,  cracker and cheese    Snack (morning)  occasional PB crackers or fruit or nutrigrain bar or PB    Lunch  from drug reps (honey baked ham sandwich on crosant)    Snack (afternoon)  none    Dinner  hamburger steak patties with mushrooms, onions, salad    Snack (evening)  none    Beverage(s)  water, unsweetened tea, diet coke, rare juice or milk      Exercise   Exercise Type  Light (walking / raking leaves)    How many days per week to you exercise?  3    How many minutes per day do you exercise?  15    Total minutes per week of exercise  45      Patient Education   Previous Diabetes Education  Yes (please comment) with several RD's over time ( Maggie, Kate, Bev, Angela)     Nutrition management   Carbohydrate counting;Meal options for control of blood glucose level and chronic complications.;Food label reading, portion sizes and measuring food.;Reviewed blood glucose goals for pre and post meals and how to evaluate the patients' food intake on their blood glucose level.;Information on hints to eating out and maintain blood glucose control.    Medications  Reviewed patients medication for diabetes, action, purpose, timing of dose and side effects.    Monitoring  Identified appropriate SMBG and/or A1C goals.;Yearly dilated eye exam    Acute complications  Taught treatment of hypoglycemia - the 15 rule.    Psychosocial adjustment  Worked with patient to identify barriers to care and solutions;Identified and addressed patients feelings and concerns about diabetes      Individualized Goals (developed by patient)   Nutrition  Follow meal plan discussed    Physical Activity  Exercise 5-7 days per week;30 minutes per day    Medications  take my medication as prescribed    Monitoring   test my blood glucose as discussed    Reducing Risk  examine blood glucose patterns;do foot checks daily;treat hypoglycemia with 15 grams of carbs if blood glucose less than 70mg /dL;increase portions of healthy fats    Health Coping  discuss diabetes with (comment) MD, RD, CDE      Patient Self-Evaluation of Goals - Patient rates self as meeting previously set goals (% of time)   Nutrition  50 - 75 %    Physical Activity  50 - 75 %    Medications  >75%    Monitoring  >75%    Problem Solving  >75%    Reducing Risk  >75%    Health Coping  >75%      Post-Education Assessment   Patient understands the diabetes disease and treatment process.  Demonstrates understanding / competency    Patient understands incorporating nutritional management into lifestyle.  Demonstrates understanding / competency    Patient undertands incorporating physical activity into lifestyle.  Demonstrates understanding  / competency    Patient understands using medications safely.  Demonstrates understanding / competency    Patient understands monitoring blood glucose, interpreting and using results  Demonstrates understanding / competency    Patient understands prevention, detection, and treatment of acute complications.  Demonstrates understanding / competency    Patient understands prevention, detection, and treatment of chronic complications.  Demonstrates understanding / competency    Patient understands how to develop strategies to address psychosocial issues.  Demonstrates understanding / competency    Patient understands how to develop strategies to promote health/change behavior.  Demonstrates understanding /  competency      Outcomes   Expected Outcomes  Demonstrated interest in learning. Expect positive outcomes    Future DMSE  PRN    Program Status  Completed      Subsequent Visit   Since your last visit have you continued or begun to take your medications as prescribed?  Yes    Since your last visit, are you checking your blood glucose at least once a day?  Yes       Individualized Plan for Diabetes Self-Management Training:   Learning Objective:  Patient will have a greater understanding of diabetes self-management. Patient education plan is to attend individual and/or group sessions per assessed needs and concerns.   Plan:   Patient Instructions  Be sure that you are eating at least 30 grams of carbohydrates at each meal. Increase your non-starchy vegetables as tolerated. Decrease your fat intake. Continue to be mindful.  Aim for 2-3 Carb Choices per meal (30-45 grams) +/- 1 either way  Aim for 0-1 Carbs per snack if hungry  Include protein in moderation with your meals and snacks Consider reading food labels for Total Carbohydrate and Fat Grams of foods Consider  increasing your activity level by walking for 30 minutes daily as tolerated Consider checking BG at alternate times per  day as directed by MD  Continue taking medication as directed by MD      Expected Outcomes:  Demonstrated interest in learning. Expect positive outcomes  Education material provided: Living Well with Diabetes, Food label handouts, Meal plan card, My Plate and Snack sheet  If problems or questions, patient to contact team via:  Phone  Future DSME appointment: PRN

## 2017-03-18 ENCOUNTER — Encounter (INDEPENDENT_AMBULATORY_CARE_PROVIDER_SITE_OTHER): Payer: Self-pay | Admitting: Physician Assistant

## 2017-03-18 MED FILL — PHENTERMINE 37.5 MG TABLET: 37.5 | 30 days supply | Qty: 30 | Fill #0

## 2017-03-18 MED FILL — LANTUS SOLOSTAR 100 UNITS/M: 100 | 33 days supply | Qty: 18 | Fill #5

## 2017-03-21 NOTE — Telephone Encounter (Signed)
See appt cancellation request

## 2017-03-23 ENCOUNTER — Ambulatory Visit (INDEPENDENT_AMBULATORY_CARE_PROVIDER_SITE_OTHER): Payer: 59 | Admitting: Physician Assistant

## 2017-04-15 MED FILL — PHENTERMINE 37.5 MG TABLET: 37.5 | 30 days supply | Qty: 30 | Fill #1

## 2017-04-15 MED FILL — HUMALOG 100 UNITS/ML KWIKPE: 100 | 35 days supply | Qty: 15 | Fill #0

## 2017-04-15 MED FILL — LANTUS SOLOSTAR 100 UNITS/M: 100 | 33 days supply | Qty: 18 | Fill #6

## 2017-04-18 DIAGNOSIS — Z8639 Personal history of other endocrine, nutritional and metabolic disease: Secondary | ICD-10-CM | POA: Diagnosis not present

## 2017-04-18 DIAGNOSIS — N200 Calculus of kidney: Secondary | ICD-10-CM | POA: Diagnosis not present

## 2017-04-18 DIAGNOSIS — E538 Deficiency of other specified B group vitamins: Secondary | ICD-10-CM | POA: Diagnosis not present

## 2017-04-18 DIAGNOSIS — E1165 Type 2 diabetes mellitus with hyperglycemia: Secondary | ICD-10-CM | POA: Diagnosis not present

## 2017-04-18 DIAGNOSIS — E785 Hyperlipidemia, unspecified: Secondary | ICD-10-CM | POA: Diagnosis not present

## 2017-04-18 DIAGNOSIS — E559 Vitamin D deficiency, unspecified: Secondary | ICD-10-CM | POA: Diagnosis not present

## 2017-04-25 DIAGNOSIS — R946 Abnormal results of thyroid function studies: Secondary | ICD-10-CM | POA: Diagnosis not present

## 2017-04-26 MED FILL — VIT D2 1.25 MG (50,000 UNIT: 1.25 MG | 56 days supply | Qty: 8 | Fill #0

## 2017-04-29 MED FILL — LANTUS SOLOSTAR 100 UNITS/M: 100 | 30 days supply | Qty: 30 | Fill #0

## 2017-05-03 MED FILL — ACCU-CHEK FASTCLIX LANCETS: 90 days supply | Qty: 306 | Fill #1

## 2017-05-03 MED FILL — UNIFINE PENTIPS 31GX3/16": 31G X 5 MM | 90 days supply | Qty: 200 | Fill #1

## 2017-05-03 MED FILL — ACCU-CHEK GUIDE STRP: 90 days supply | Qty: 300 | Fill #1

## 2017-05-03 MED FILL — UNIFINE PENTIPS 31GX3/16: 31G X 5 MM | 90 days supply | Qty: 200 | Fill #1

## 2017-05-09 MED FILL — HUMALOG 100 UNITS/ML KWIKPE: 100 | 28 days supply | Qty: 15 | Fill #0

## 2017-05-16 MED FILL — PHENTERMINE 37.5 MG TABLET: 37.5 | 30 days supply | Qty: 30 | Fill #2

## 2017-05-25 DIAGNOSIS — E782 Mixed hyperlipidemia: Secondary | ICD-10-CM | POA: Diagnosis not present

## 2017-05-25 DIAGNOSIS — Z7984 Long term (current) use of oral hypoglycemic drugs: Secondary | ICD-10-CM | POA: Diagnosis not present

## 2017-05-25 DIAGNOSIS — E6609 Other obesity due to excess calories: Secondary | ICD-10-CM | POA: Diagnosis not present

## 2017-05-25 DIAGNOSIS — E039 Hypothyroidism, unspecified: Secondary | ICD-10-CM | POA: Diagnosis not present

## 2017-05-25 DIAGNOSIS — K219 Gastro-esophageal reflux disease without esophagitis: Secondary | ICD-10-CM | POA: Diagnosis not present

## 2017-05-25 DIAGNOSIS — M5136 Other intervertebral disc degeneration, lumbar region: Secondary | ICD-10-CM | POA: Diagnosis not present

## 2017-05-25 DIAGNOSIS — E538 Deficiency of other specified B group vitamins: Secondary | ICD-10-CM | POA: Diagnosis not present

## 2017-05-25 DIAGNOSIS — Z6834 Body mass index (BMI) 34.0-34.9, adult: Secondary | ICD-10-CM | POA: Diagnosis not present

## 2017-05-25 DIAGNOSIS — E559 Vitamin D deficiency, unspecified: Secondary | ICD-10-CM | POA: Diagnosis not present

## 2017-05-25 DIAGNOSIS — E109 Type 1 diabetes mellitus without complications: Secondary | ICD-10-CM | POA: Diagnosis not present

## 2017-05-25 MED FILL — DOXYCYCLINE HYCLATE 100 MG: 100 | 30 days supply | Qty: 30 | Fill #0

## 2017-06-03 MED FILL — HUMALOG 100 UNITS/ML KWIKPE: 100 | 28 days supply | Qty: 15 | Fill #1

## 2017-06-03 MED FILL — LANTUS SOLOSTAR 100 UNITS/M: 100 | 30 days supply | Qty: 30 | Fill #1

## 2017-06-03 MED FILL — PEG-3350 SOLUTION: 420 | 1 days supply | Qty: 4000 | Fill #0

## 2017-06-10 DIAGNOSIS — Z86010 Personal history of colonic polyps: Secondary | ICD-10-CM | POA: Diagnosis not present

## 2017-06-10 DIAGNOSIS — Z980 Intestinal bypass and anastomosis status: Secondary | ICD-10-CM | POA: Diagnosis not present

## 2017-06-10 DIAGNOSIS — K635 Polyp of colon: Secondary | ICD-10-CM | POA: Diagnosis not present

## 2017-06-10 DIAGNOSIS — D126 Benign neoplasm of colon, unspecified: Secondary | ICD-10-CM | POA: Diagnosis not present

## 2017-06-10 DIAGNOSIS — K5730 Diverticulosis of large intestine without perforation or abscess without bleeding: Secondary | ICD-10-CM | POA: Diagnosis not present

## 2017-06-24 DIAGNOSIS — E538 Deficiency of other specified B group vitamins: Secondary | ICD-10-CM | POA: Diagnosis not present

## 2017-06-27 MED FILL — OSCIMIN SR 0.375 MG TABLET: 0.375 | 30 days supply | Qty: 60 | Fill #1

## 2017-07-13 MED FILL — FLUCONAZOLE 100 MG TABLET: 100 | 10 days supply | Qty: 10 | Fill #0

## 2017-07-13 MED FILL — HUMALOG 100 UNITS/ML KWIKPE: 100 | 28 days supply | Qty: 15 | Fill #2

## 2017-07-13 MED FILL — AMOXICILLIN 500 MG CAPSULE: 500 | 10 days supply | Qty: 40 | Fill #0

## 2017-07-13 MED FILL — LANTUS SOLOSTAR 100 UNITS/M: 100 | 50 days supply | Qty: 30 | Fill #2

## 2017-07-18 ENCOUNTER — Ambulatory Visit (HOSPITAL_COMMUNITY)
Admission: EM | Admit: 2017-07-18 | Discharge: 2017-07-18 | Disposition: A | Discharge status: Home / Self Care | Payer: 59

## 2017-07-18 ENCOUNTER — Other Ambulatory Visit: Payer: Self-pay

## 2017-07-18 ENCOUNTER — Encounter (HOSPITAL_COMMUNITY): Payer: Self-pay | Admitting: *Deleted

## 2017-07-18 DIAGNOSIS — M79672 Pain in left foot: Secondary | ICD-10-CM | POA: Diagnosis not present

## 2017-07-18 DIAGNOSIS — W25XXXA Contact with sharp glass, initial encounter: Secondary | ICD-10-CM

## 2017-07-18 DIAGNOSIS — Z794 Long term (current) use of insulin: Secondary | ICD-10-CM

## 2017-07-18 DIAGNOSIS — IMO0002 Reserved for concepts with insufficient information to code with codable children: Secondary | ICD-10-CM

## 2017-07-18 DIAGNOSIS — S9031XA Contusion of right foot, initial encounter: Secondary | ICD-10-CM | POA: Diagnosis not present

## 2017-07-18 DIAGNOSIS — E1165 Type 2 diabetes mellitus with hyperglycemia: Secondary | ICD-10-CM

## 2017-07-18 NOTE — ED Triage Notes (Signed)
States she broke he crock pot last pm and thinks she has glass in her right foot.

## 2017-07-18 NOTE — ED Provider Notes (Signed)
MRN: [id number] DOB: [birth year]  Subjective:   Alexis [name] is a 62 y.o. female presenting for 1 day history of right foot injury.  Patient states that the crockpot broke and glass scattered onto her floor.  Patient thought she cleaned it up but when she came back into the kitchen felt the sharp pain in her right foot laterally.  She states that she dug around for a little bit because she could feel foreign body and picked out a small piece of glass.  She contacted her PCP who told her to come here to get checked.  She is a uncontrolled type II diabetic on insulin.  Denies fever, drainage of pus, redness, warmth, swelling.  She does state that she does have some mild pain intermittently.  This is been relieved by taking Motrin or Aleve.  No current facility-administered medications for this encounter.   Current Outpatient Medications:  .  aspirin 81 MG tablet, Take 81 mg by mouth every other day. , Disp: , Rfl:  .  insulin glargine (LANTUS) 100 UNIT/ML injection, Inject 44 Units into the skin at bedtime. , Disp: , Rfl:  .  insulin lispro (HUMALOG) 100 UNIT/ML injection, Inject 10 Units into the skin 3 (three) times daily before meals., Disp: , Rfl:  .  ondansetron (ZOFRAN-ODT) 4 MG disintegrating tablet, Take 1 tablet (4 mg total) by mouth every 8 (eight) hours as needed for nausea or vomiting. (Patient not taking: Reported on 03/14/2017), Disp: 8 tablet, Rfl: 0 .  OSCIMIN SR 0.375 MG 12 hr tablet, Take 0.375 mg by mouth every 12 (twelve) hours., Disp: , Rfl: 0 .  phentermine (ADIPEX-P) 37.5 MG tablet, Take 37.5 mg by mouth daily., Disp: , Rfl: 3 .  Vitamin D, Ergocalciferol, (DRISDOL) 50000 units CAPS capsule, Take 50,000 Units by mouth 2 (two) times a week. Sunday-Wednesday, Disp: , Rfl: 4   Allergies  Allergen Reactions  . Canagliflozin     Other reaction(s): yeast infection  . Empagliflozin     Other reaction(s): upset stomach  . Metformin Hcl     Other reaction(s): diarrhea  .  Parlodel [Bromocriptine Mesylate] Hives    Past Medical History:  Diagnosis Date  . ASCUS of cervix with negative high risk HPV 09/2016  . Diabetes mellitus without complication ([place])   . Diverticulitis   . Facial trauma    forehead,nose and upper lip secondary to fall   . Fall   . History of bronchitis   . History of urinary tract infection   . Hyperparathyroidism ([place])   . Hypertension   . Kidney stones   . Parathyroid tumor   . PONV (postoperative nausea and vomiting)    emotional when awakens / cries  . 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 Yeast infection    history of      Past Surgical History:  Procedure Laterality Date  . ABDOMINAL HYSTERECTOMY    . APPENDECTOMY    . CARPAL TUNNEL RELEASE     right and left   . CHOLECYSTECTOMY    . COLON SURGERY    . colonscopy     . DILATION AND CURETTAGE OF UTERUS    . HYSTEROSCOPY  2003  . LAVH  2004   leiomyoma/irregular bleeding  . PARATHYROIDECTOMY N/A 02/21/2015   Procedure: RIGHT PARATHYROIDECTOMY;  Surgeon: [name], MD;  Location: [facility];  Service: General;  Laterality: N/A;  . TUBAL LIGATION      Objective:  Vitals: BP (!) 151/57 (BP Location: Left Arm)   Pulse 70   Temp 98.2 F (36.8 C) (Oral)   Resp 18   SpO2 100%   Physical Exam  Constitutional: She is oriented to person, place, and time. She appears well-developed and well-nourished.  Cardiovascular: Normal rate.  Pulmonary/Chest: Effort normal.  Musculoskeletal:       Right foot: There is tenderness (mild with resolving puncture wound). There is normal range of motion, no bony tenderness, no swelling, normal capillary refill, no crepitus, no deformity and no laceration.       Feet:  Neurological: She is alert and oriented to person, place, and time.   Assessment and Plan :   Contusion of right foot, initial encounter  Left foot pain  Uncontrolled type 2 diabetes mellitus with insulin therapy  ([place])  Will monitor and manage conservatively for contusion.  Return to clinic precautions discussed.     Jaynee Eagles, [place] 07/18/17 0352

## 2017-07-18 NOTE — Discharge Instructions (Addendum)
You may take 500mg  Tylenol with ibuprofen 400mg  every 6 hours for pain and inflammation. If you develop fever, redness, warmth, drainage of pus then that could mean that your wound is infected or you actually still have glass. Otherwise, we should manage this conservatively as a bruise (contusion) from the puncture of a glass. Keep taking amoxicillin as your PCP prescribed it.

## 2017-07-19 ENCOUNTER — Encounter: Payer: Self-pay | Admitting: Podiatry

## 2017-07-19 ENCOUNTER — Ambulatory Visit (INDEPENDENT_AMBULATORY_CARE_PROVIDER_SITE_OTHER): Payer: 59

## 2017-07-19 ENCOUNTER — Ambulatory Visit: Payer: 59 | Admitting: Podiatry

## 2017-07-19 DIAGNOSIS — S90851A Superficial foreign body, right foot, initial encounter: Secondary | ICD-10-CM | POA: Diagnosis not present

## 2017-07-19 NOTE — Progress Notes (Signed)
Subjective:    Patient ID: Alexis [name], female    DOB: January 14, 1955, 62 y.o.   MRN: [id number]  HPI 62 year old female presents the office with concerns of residual foreign body to her right foot.  She states that on Sunday she dropped her crockpot lid and it shattered.  She put a piece of glass out of her foot but she still there is a piece in there and she can feel it.  She did go to urgent care but no x-rays were taken.  She presents today for further evaluation.  Her last tetanus shot was in 2016.  She denies any redness or drainage or any swelling.  She has no other concerns today.  She is currently on amoxicillin for another issue.   Review of Systems  All other systems reviewed and are negative.  Past Medical History:  Diagnosis Date  . ASCUS of cervix with negative high risk HPV 09/2016  . Diabetes mellitus without complication ([place])   . Diverticulitis   . Facial trauma    forehead,nose and upper lip secondary to fall   . Fall   . History of bronchitis   . History of urinary tract infection   . Hyperparathyroidism ([place])   . Hypertension   . Kidney stones   . Parathyroid tumor   . PONV (postoperative nausea and vomiting)    emotional when awakens / cries  . 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 Yeast infection    history of     Past Surgical History:  Procedure Laterality Date  . ABDOMINAL HYSTERECTOMY    . APPENDECTOMY    . CARPAL TUNNEL RELEASE     right and left   . CHOLECYSTECTOMY    . COLON SURGERY    . colonscopy     . DILATION AND CURETTAGE OF UTERUS    . HYSTEROSCOPY  2003  . LAVH  2004   leiomyoma/irregular bleeding  . PARATHYROIDECTOMY N/A 02/21/2015   Procedure: RIGHT PARATHYROIDECTOMY;  Surgeon: [name], MD;  Location: [facility];  Service: General;  Laterality: N/A;  . TUBAL LIGATION       Current Outpatient Medications:  .  aspirin 81 MG tablet, Take 81 mg by mouth every other day. , Disp: ,  Rfl:  .  insulin glargine (LANTUS) 100 UNIT/ML injection, Inject 44 Units into the skin at bedtime. , Disp: , Rfl:  .  insulin lispro (HUMALOG) 100 UNIT/ML injection, Inject 10 Units into the skin 3 (three) times daily before meals., Disp: , Rfl:  .  ondansetron (ZOFRAN-ODT) 4 MG disintegrating tablet, Take 1 tablet (4 mg total) by mouth every 8 (eight) hours as needed for nausea or vomiting. (Patient not taking: Reported on 03/14/2017), Disp: 8 tablet, Rfl: 0 .  OSCIMIN SR 0.375 MG 12 hr tablet, Take 0.375 mg by mouth every 12 (twelve) hours., Disp: , Rfl: 0 .  phentermine (ADIPEX-P) 37.5 MG tablet, Take 37.5 mg by mouth daily., Disp: , Rfl: 3 .  Vitamin D, Ergocalciferol, (DRISDOL) 50000 units CAPS capsule, Take 50,000 Units by mouth 2 (two) times a week. Sunday-Wednesday, Disp: , Rfl: 4  Allergies  Allergen Reactions  . Canagliflozin     Other reaction(s): yeast infection  . Empagliflozin     Other reaction(s): upset stomach  . Metformin Hcl     Other reaction(s): diarrhea  . Parlodel [Bromocriptine Mesylate] Hives         Objective:   Physical  Exam  General: AAO x3, NAD  Dermatological: Small puncture wound is present on the right foot just lateral to the fifth metatarsal head.  There is no significant edema, erythema, drainage or pus identified today.  No other open lesions.  Vascular: Dorsalis Pedis artery and Posterior Tibial artery pedal pulses are 2/4 bilateral with immedate capillary fill time.  There is no pain with calf compression, swelling, warmth, erythema.   Neruologic: Grossly intact via light touch bilateral.  Protective threshold with Semmes Wienstein monofilament intact to all pedal sites bilateral.   Musculoskeletal: Tenderness along the area of the [place] but no other areas of tenderness.  Muscular strength 5/5 in all groups tested bilateral.  Gait: Unassisted, Nonantalgic.     Assessment & Plan:  62 year old female with residual foreign object right  foot -Treatment options discussed including all alternatives, risks, and complications -Etiology of symptoms were discussed -X-rays were obtained and reviewed with the patient.  A skin marker was utilized to identify the area of the foreign object.  He can identify a small punctate area looks like a piece of glass.  No evidence of acute fracture. We discussed exploring the area injected and trying to remove the foreign object in the office and she understands risks complications and she wished to proceed. I anesthetized the area with 3 cc of lidocaine and Marcaine plain in a regional block fashion.  The area was prepped with Betadine.  A #15 blade scalpel was utilized to remove a small amount of overlying skin to the area.  Unable to palpate a piece of glass which was removed.  I further inspected the area and I was unable to palpate any further foreign object.  The area was cleaned with alcohol.  Antibiotic ointment and a bandage was applied.  Only continue with daily dressing changes.  She is already on antibiotics for another issue which were to continue.  There is no signs or symptoms of infection she is to call the office.  I will see her back in 1 week to check the wound or sooner if needed.  She continues to have pain to the area on the consider getting ultrasound.  Trula Slade DPM

## 2017-07-20 DIAGNOSIS — S90851A Superficial foreign body, right foot, initial encounter: Secondary | ICD-10-CM | POA: Insufficient documentation

## 2017-07-22 DIAGNOSIS — E538 Deficiency of other specified B group vitamins: Secondary | ICD-10-CM | POA: Diagnosis not present

## 2017-08-01 DIAGNOSIS — E1165 Type 2 diabetes mellitus with hyperglycemia: Secondary | ICD-10-CM | POA: Diagnosis not present

## 2017-08-01 DIAGNOSIS — E538 Deficiency of other specified B group vitamins: Secondary | ICD-10-CM | POA: Diagnosis not present

## 2017-08-01 DIAGNOSIS — E785 Hyperlipidemia, unspecified: Secondary | ICD-10-CM | POA: Diagnosis not present

## 2017-08-01 DIAGNOSIS — E559 Vitamin D deficiency, unspecified: Secondary | ICD-10-CM | POA: Diagnosis not present

## 2017-08-01 DIAGNOSIS — R946 Abnormal results of thyroid function studies: Secondary | ICD-10-CM | POA: Diagnosis not present

## 2017-08-01 DIAGNOSIS — N200 Calculus of kidney: Secondary | ICD-10-CM | POA: Diagnosis not present

## 2017-08-03 DIAGNOSIS — H524 Presbyopia: Secondary | ICD-10-CM | POA: Diagnosis not present

## 2017-08-08 MED FILL — HUMALOG 100 UNITS/ML KWIKPE: 100 | 90 days supply | Qty: 75 | Fill #0

## 2017-08-18 MED FILL — LANTUS SOLOSTAR 100 UNITS/M: 100 | 43 days supply | Qty: 30 | Fill #0

## 2017-08-25 DIAGNOSIS — E109 Type 1 diabetes mellitus without complications: Secondary | ICD-10-CM | POA: Diagnosis not present

## 2017-08-25 DIAGNOSIS — E559 Vitamin D deficiency, unspecified: Secondary | ICD-10-CM | POA: Diagnosis not present

## 2017-08-25 DIAGNOSIS — J301 Allergic rhinitis due to pollen: Secondary | ICD-10-CM | POA: Diagnosis not present

## 2017-08-25 DIAGNOSIS — K219 Gastro-esophageal reflux disease without esophagitis: Secondary | ICD-10-CM | POA: Diagnosis not present

## 2017-08-25 DIAGNOSIS — E782 Mixed hyperlipidemia: Secondary | ICD-10-CM | POA: Diagnosis not present

## 2017-08-25 DIAGNOSIS — E538 Deficiency of other specified B group vitamins: Secondary | ICD-10-CM | POA: Diagnosis not present

## 2017-08-25 DIAGNOSIS — M5136 Other intervertebral disc degeneration, lumbar region: Secondary | ICD-10-CM | POA: Diagnosis not present

## 2017-08-25 DIAGNOSIS — E039 Hypothyroidism, unspecified: Secondary | ICD-10-CM | POA: Diagnosis not present

## 2017-08-25 DIAGNOSIS — L719 Rosacea, unspecified: Secondary | ICD-10-CM | POA: Diagnosis not present

## 2017-09-08 ENCOUNTER — Ambulatory Visit (INDEPENDENT_AMBULATORY_CARE_PROVIDER_SITE_OTHER): Payer: 59

## 2017-09-08 ENCOUNTER — Encounter: Payer: Self-pay | Admitting: Podiatry

## 2017-09-08 ENCOUNTER — Ambulatory Visit (INDEPENDENT_AMBULATORY_CARE_PROVIDER_SITE_OTHER): Payer: 59 | Admitting: Podiatry

## 2017-09-08 DIAGNOSIS — M722 Plantar fascial fibromatosis: Secondary | ICD-10-CM | POA: Insufficient documentation

## 2017-09-08 MED ORDER — TRIAMCINOLONE ACETONIDE 10 MG/ML IJ SUSP
10.0000 mg | Freq: Once | INTRAMUSCULAR | Status: AC
  Administered 2017-09-08: 10 mg

## 2017-09-08 MED ORDER — MELOXICAM 7.5 MG PO TABS: 7.5000 mg | ORAL_TABLET | Freq: Every day | ORAL | 0 refills | Status: DC

## 2017-09-08 MED FILL — MELOXICAM 7.5 MG TABLET: 7.5 | 30 days supply | Qty: 30 | Fill #0

## 2017-09-08 NOTE — Progress Notes (Signed)
Subjective: 62 year old female presents today for new concerns of pain to the right foot which started yesterday.  She had some intermittent pain ongoing but yesterday while trying to do some walking she had some pain to the heel.  She states that starts in the heel and goes to the arch of the foot at times.  No recent injury or trauma.  She describes a throbbing sensation and hurts at times to put pressure to the area.  She does not change her shoe gear or activity.  Symptoms started as she was walking from the office to the hospital.  She states the foreign body area is healed nicely and no issues. Denies any systemic complaints such as fevers, chills, nausea, vomiting. No acute changes since last appointment, and no other complaints at this time.   Objective: AAO x3, NAD DP/PT pulses palpable bilaterally, CRT less than 3 seconds Tenderness to palpation along the plantar medial tubercle of the calcaneus at the insertion of plantar fascia on the right foot. There is no pain along the course of the plantar fascia within the arch of the foot. Plantar fascia appears to be intact. There is no pain with lateral compression of the calcaneus or pain with vibratory sensation. There is no pain along the course or insertion of the achilles tendon. No other areas of tenderness to bilateral lower extremities.  Negative Tinel sign.  Previous area deformity is healed without any signs of infection there is no swelling and pain. No open lesions or pre-ulcerative lesions.  No pain with calf compression, swelling, warmth, erythema  Assessment: Right heel pain, likely plantar fasciitis  Plan: -All treatment options discussed with the patient including all alternatives, risks, complications.  -X-rays obtained reviewed.  No evidence of acute fracture or stress fracture or foreign body identified. -Steroid injection was performed.  See procedure note below. -Plantar fascial brace dispensed -Prescribed mobic. Discussed  side effects of the medication and directed to stop if any are to occur and call the office.  -Stretching, icing daily.  Discussed shoe modifications and orthotics -Patient encouraged to call the office with any questions, concerns, change in symptoms.   Procedure: Injection Tendon/Ligament Discussed alternatives, risks, complications and verbal consent was obtained.  Location: Right plantar fascia at the glabrous junction; medial approach. Skin Prep: Alcohol. Injectate: 0.5cc 0.5% marcaine plain, 0.5 cc 2% lidocaine plain and, 1 cc kenalog 10. Disposition: Patient tolerated procedure well. Injection site dressed with a band-aid.  Post-injection care was discussed and return precautions discussed.   Trula Slade DPM

## 2017-09-08 NOTE — Patient Instructions (Signed)

## 2017-09-16 ENCOUNTER — Encounter: Payer: Self-pay | Admitting: Gynecology

## 2017-09-16 ENCOUNTER — Ambulatory Visit (INDEPENDENT_AMBULATORY_CARE_PROVIDER_SITE_OTHER): Payer: 59 | Admitting: Gynecology

## 2017-09-16 VITALS — BP 124/78 | Ht 63.0 in | Wt 204.0 lb

## 2017-09-16 DIAGNOSIS — N6320 Unspecified lump in the left breast, unspecified quadrant: Secondary | ICD-10-CM

## 2017-09-16 DIAGNOSIS — R87622 Low grade squamous intraepithelial lesion on cytologic smear of vagina (LGSIL): Secondary | ICD-10-CM | POA: Diagnosis not present

## 2017-09-16 DIAGNOSIS — R87610 Atypical squamous cells of undetermined significance on cytologic smear of cervix (ASC-US): Secondary | ICD-10-CM

## 2017-09-16 DIAGNOSIS — Z01411 Encounter for gynecological examination (general) (routine) with abnormal findings: Secondary | ICD-10-CM

## 2017-09-16 NOTE — Addendum Note (Signed)
Addended by: Nelva Nay on: 09/16/2017 12:41 PM   Modules accepted: Orders

## 2017-09-16 NOTE — Patient Instructions (Signed)
[facility] should [facility] to schedule the mammogram and ultrasound in reference to the area felt in the left breast.  If you do not hear from them within the next week call my office.

## 2017-09-16 NOTE — Progress Notes (Signed)
    Alexis [name] 11-24-1955 [id number]        62 y.o.  [id number] for annual gynecologic exam.  Without gynecologic complaints  Past medical history,surgical history, problem list, medications, allergies, family history and social history were all reviewed and documented as reviewed in the EPIC chart.  ROS:  Performed with pertinent positives and negatives included in the history, assessment and plan.   Additional significant findings : None   Exam: Alexis [name] assistant Vitals:   09/16/17 1203  BP: 124/78  Weight: 204 lb (92.5 kg)  Height: 5\' 3"  (1.6 m)   Body mass index is 36.14 kg/m.  General appearance:  Normal affect, orientation and appearance. Skin: Grossly normal HEENT: Without gross lesions.  No cervical or supraclavicular adenopathy. Thyroid normal.  Lungs:  Clear without wheezing, rales or rhonchi Cardiac: RR, without RMG Abdominal:  Soft, nontender, without masses, guarding, rebound, organomegaly or hernia Breasts:  Examined lying and sitting.  Right without masses, retractions, discharge or axillary adenopathy.  Left with 2 cm ill-defined mass tail of Spence.  No overlying skin changes.  No nipple discharge or axillary adenopathy. Pelvic:  Ext, BUS, Vagina: Normal with mild atrophic changes.  Pap smear of cuff done  Adnexa: Without masses or tenderness    Anus and perineum: Normal   Rectovaginal: Normal sphincter tone without palpated masses or tenderness.    Assessment/Plan:  62 y.o. [id number] female for annual gynecologic exam status post LAVH 2004 for leiomyoma/menorrhagia..   1. Postmenopausal.  No significant menopausal symptoms. 2. History VAIN 01/2012 on colposcopic biopsy.  Pap smear/HPV 2016 negative.  Pap smear 2017 negative.  Pap smear last year showed ASCUS negative high risk HPV.  Pap smear of vaginal cuff done today. 3. Palpable mass left breast tail of Spence.  Last mammogram 2016.  Will schedule diagnostic mammography and ultrasound.   Differential reviewed with the patient and importance of follow-up stressed.  She will call my office if she does not hear about scheduling the appointment in 1 week. 4. Colonoscopy 2019.  Repeat at their recommended interval. 5. DEXA 2018 normal.  Plan repeat DEXA at age 55. 6. Health maintenance.  No routine lab work done as patient does this elsewhere.  Follow-up for breast studies.  Follow-up for annual exam in 1 year.   [name] MD, 12:29 PM 09/16/2017

## 2017-09-19 ENCOUNTER — Encounter: Payer: Self-pay | Admitting: *Deleted

## 2017-09-19 ENCOUNTER — Telehealth: Payer: Self-pay | Admitting: *Deleted

## 2017-09-19 DIAGNOSIS — N63 Unspecified lump in unspecified breast: Secondary | ICD-10-CM

## 2017-09-19 LAB — PAP IG W/ RFLX HPV ASCU

## 2017-09-19 NOTE — Telephone Encounter (Signed)
Patient received mychart message.

## 2017-09-19 NOTE — Telephone Encounter (Signed)
-----   Message from [name], MD sent at 09/16/2017 12:29 PM EDT ----- Schedule diagnostic mammography and ultrasound at the breast center reference: New palpable mass left tail of Spence

## 2017-09-19 NOTE — Telephone Encounter (Signed)
Orders placed at breast center scheduled on 09/22/17 @ 12:40pm. Sent my chart message, cell # not able to leave message.

## 2017-09-20 ENCOUNTER — Encounter: Payer: Self-pay | Admitting: Gynecology

## 2017-09-21 MED FILL — LANTUS SOLOSTAR 100 UNITS/M: 100 | 86 days supply | Qty: 60 | Fill #1

## 2017-09-22 ENCOUNTER — Ambulatory Visit
Admission: RE | Admit: 2017-09-22 | Discharge: 2017-09-22 | Disposition: A | Discharge status: Home / Self Care | Payer: 59 | Source: Ambulatory Visit | Attending: Gynecology | Admitting: Gynecology

## 2017-09-22 ENCOUNTER — Other Ambulatory Visit: Payer: Self-pay | Admitting: Gynecology

## 2017-09-22 DIAGNOSIS — N63 Unspecified lump in unspecified breast: Secondary | ICD-10-CM

## 2017-09-22 DIAGNOSIS — N6312 Unspecified lump in the right breast, upper inner quadrant: Secondary | ICD-10-CM | POA: Diagnosis not present

## 2017-09-22 DIAGNOSIS — N6321 Unspecified lump in the left breast, upper outer quadrant: Secondary | ICD-10-CM | POA: Diagnosis not present

## 2017-09-22 DIAGNOSIS — R922 Inconclusive mammogram: Secondary | ICD-10-CM | POA: Diagnosis not present

## 2017-09-23 MED FILL — ACCU-CHEK FASTCLIX LANCETS: 90 days supply | Qty: 306 | Fill #0

## 2017-09-23 MED FILL — UNIFINE PENTIPS 31GX3/16: 31G X 5 MM | 90 days supply | Qty: 200 | Fill #0

## 2017-09-23 MED FILL — ACCU-CHEK GUIDE TEST STRIP: 90 days supply | Qty: 300 | Fill #0

## 2017-09-23 MED FILL — UNIFINE PENTIPS 31GX3/16": 31G X 5 MM | 90 days supply | Qty: 200 | Fill #0

## 2017-09-26 ENCOUNTER — Ambulatory Visit
Admission: RE | Admit: 2017-09-26 | Discharge: 2017-09-26 | Disposition: A | Discharge status: Home / Self Care | Payer: 59 | Source: Ambulatory Visit | Attending: Gynecology | Admitting: Gynecology

## 2017-09-26 ENCOUNTER — Other Ambulatory Visit: Payer: Self-pay | Admitting: Gynecology

## 2017-09-26 DIAGNOSIS — N63 Unspecified lump in unspecified breast: Secondary | ICD-10-CM

## 2017-09-26 DIAGNOSIS — C50412 Malignant neoplasm of upper-outer quadrant of left female breast: Secondary | ICD-10-CM | POA: Diagnosis not present

## 2017-09-26 DIAGNOSIS — N6321 Unspecified lump in the left breast, upper outer quadrant: Secondary | ICD-10-CM | POA: Diagnosis not present

## 2017-09-28 MED FILL — PHENTERMINE 37.5 MG TABLET: 37.5 | 30 days supply | Qty: 30 | Fill #0

## 2017-09-29 ENCOUNTER — Ambulatory Visit: Payer: 59 | Admitting: Podiatry

## 2017-09-29 ENCOUNTER — Encounter: Payer: Self-pay | Admitting: Podiatry

## 2017-09-29 DIAGNOSIS — M779 Enthesopathy, unspecified: Secondary | ICD-10-CM | POA: Diagnosis not present

## 2017-09-29 DIAGNOSIS — M722 Plantar fascial fibromatosis: Secondary | ICD-10-CM

## 2017-10-03 DIAGNOSIS — R7989 Other specified abnormal findings of blood chemistry: Secondary | ICD-10-CM | POA: Diagnosis not present

## 2017-10-03 NOTE — Progress Notes (Signed)
Subjective: 62 year old female presents the office today for follow-up evaluation of right heel pain, plantar fasciitis.  She states overall she is doing better since the steroid injection.  She points the lateral aspect of her foot on the plantar aspect of the fifth metatarsal base but she does continue get some discomfort.  She denies recent injury or trauma since I last saw her she is been doing stretching icing as much as possible. Denies any systemic complaints such as fevers, chills, nausea, vomiting. No acute changes since last appointment, and no other complaints at this time.   Objective: AAO x3, NAD DP/PT pulses palpable bilaterally, CRT less than 3 seconds There is decreased tenderness palpation on the plantar medial tubercle of the calcaneus at the insertion of plantar fascia.  There is tenderness of the fifth metatarsal base plantarly.  Peroneal tendon appears to be intact.  Achilles tendon appears intact.  Myofascial intact.  No significant edema, erythema, increase in warmth.  No open lesions or pre-ulcerative lesions.  No pain with calf compression, swelling, warmth, erythema  Assessment: Resolving right foot plantar fasciitis with peroneal tendinitis likely result of compensation  Plan: -All treatment options discussed with the patient including all alternatives, risks, complications.  -Today steroid injection was performed to the fifth metatarsal base.  See procedure note below.  Continue plantar fascial brace, stretching, icing daily.  Custom orthotics and shoe modifications. -Patient encouraged to call the office with any questions, concerns, change in symptoms.   Procedure: Injection Tendon/Ligament Discussed alternatives, risks, complications and verbal consent was obtained.  Location: Right plantar fifth metatarsal base Skin Prep: Alcohol. Injectate: 0.5cc 0.5% marcaine plain, 0.5 cc dexamethasone phosphate Disposition: Patient tolerated procedure well. Injection site  dressed with a band-aid.  Post-injection care was discussed and return precautions discussed.   Trula Slade DPM

## 2017-10-05 ENCOUNTER — Ambulatory Visit: Payer: Self-pay | Admitting: Surgery

## 2017-10-05 ENCOUNTER — Encounter (HOSPITAL_BASED_OUTPATIENT_CLINIC_OR_DEPARTMENT_OTHER): Payer: Self-pay | Admitting: *Deleted

## 2017-10-05 ENCOUNTER — Other Ambulatory Visit: Payer: Self-pay

## 2017-10-05 DIAGNOSIS — C50412 Malignant neoplasm of upper-outer quadrant of left female breast: Secondary | ICD-10-CM | POA: Diagnosis not present

## 2017-10-05 DIAGNOSIS — C50912 Malignant neoplasm of unspecified site of left female breast: Secondary | ICD-10-CM | POA: Diagnosis not present

## 2017-10-05 DIAGNOSIS — Z170 Estrogen receptor positive status [ER+]: Secondary | ICD-10-CM | POA: Diagnosis not present

## 2017-10-07 ENCOUNTER — Other Ambulatory Visit: Payer: Self-pay

## 2017-10-07 ENCOUNTER — Encounter (HOSPITAL_BASED_OUTPATIENT_CLINIC_OR_DEPARTMENT_OTHER)
Admission: RE | Admit: 2017-10-07 | Discharge: 2017-10-07 | Disposition: A | Discharge status: Home / Self Care | Payer: 59 | Source: Ambulatory Visit | Attending: Psychiatry | Admitting: Psychiatry

## 2017-10-07 DIAGNOSIS — E119 Type 2 diabetes mellitus without complications: Secondary | ICD-10-CM | POA: Insufficient documentation

## 2017-10-07 DIAGNOSIS — Z01810 Encounter for preprocedural cardiovascular examination: Secondary | ICD-10-CM | POA: Diagnosis not present

## 2017-10-07 LAB — BASIC METABOLIC PANEL
Anion gap: 8 (ref 5–15)
BUN: 13 mg/dL (ref 8–23)
CO2: 24 mmol/L (ref 22–32)
Calcium: 9 mg/dL (ref 8.9–10.3)
Chloride: 106 mmol/L (ref 98–111)
Creatinine, Ser: 0.6 mg/dL (ref 0.44–1.00)
GFR calc Af Amer: >60 mL/min (ref >60)
GFR calc non Af Amer: >60 mL/min (ref >60)
Glucose, Bld: 253 mg/dL — ABNORMAL HIGH (ref 70–99)
Potassium: 4.2 mmol/L (ref 3.5–5.1)
Sodium: 138 mmol/L (ref 135–145)

## 2017-10-09 ENCOUNTER — Encounter (HOSPITAL_BASED_OUTPATIENT_CLINIC_OR_DEPARTMENT_OTHER): Payer: Self-pay | Admitting: Surgery

## 2017-10-09 DIAGNOSIS — Z170 Estrogen receptor positive status [ER+]: Secondary | ICD-10-CM

## 2017-10-09 DIAGNOSIS — C50412 Malignant neoplasm of upper-outer quadrant of left female breast: Secondary | ICD-10-CM

## 2017-10-09 NOTE — H&P (Signed)
General Surgery [facility]  Alexis [name] Documented: 10/05/2017 9:28 AM Location: [name] Patient #: [id number] DOB: 17-Jan-1955 Married / Language: Alexis [name] / Race: White Female   History of Present Illness Alexis Regal MD; 10/05/2017 9:56 AM) The patient is a 62 year old female who presents with breast cancer.  CHIEF COMPLAINT: left breast carcinoma  Patient is referred from the breast cancer center for evaluation of newly diagnosed left breast carcinoma. Patient's primary care physician is Dr. Octavio [name]. Gynecologist is Dr. Donalynn [name]. Patient had noted a left breast mass on self-examination. She had begun a new weight loss program and had lost approximately 10 pounds. She was able to palpate a mass in the upper outer quadrant of the left breast. She demonstrated this to her gynecologist who then referred her for further diagnostic studies. Patient underwent mammogram and ultrasound. She underwent needle biopsy. She was found to have a grade 1 invasive ductal carcinoma measuring 2 cm in greatest dimension in the upper outer quadrant of the left breast. Axilla was negative on ultrasound examination. Patient has been previously evaluated in my office many years ago for cyst in the breast. She had undergone needle aspiration. She had not had any excisional biopsies. There is a family history of breast cancer in a maternal aunt and maternal cousins. There is no history of breast cancer in first-degree relatives. Patient does have a daughter who is 42 years old. Patient denies any nipple discharge. She denies any significant pain. She presents today to discuss definitive surgery for management of left breast carcinoma. Patient was presented this morning at the breast cancer conference.   Problem List/Past Medical Alexis Regal, MD; 10/05/2017 10:01 AM) PRIMARY HYPERPARATHYROIDISM (E21.0)  CARCINOMA OF UPPER-OUTER QUADRANT  OF LEFT BREAST IN FEMALE, ESTROGEN RECEPTOR POSITIVE (C50.412)  BREAST CARCINOMA, FEMALE, LEFT (X32.355)   Past Surgical History Alexis Regal, MD; 10/05/2017 10:01 AM) Parathyroidectomy  Breast Biopsy  Right. Cesarean Section - 1  Colon Polyp Removal - Colonoscopy  Colon Removal - Partial  Gallbladder Surgery - Laparoscopic   Diagnostic Studies History Alexis Regal, MD; 10/05/2017 10:01 AM) Colonoscopy  1-5 years ago Mammogram  within last year Pap Smear  1-5 years ago  Allergies (Alexis Massenburg, LPN; 7/32/2025 4:27 AM) Parlodel *ANTIPARKINSON AGENTS*  Hives. Allergies Reconciled   Medication History (Alexis Massenburg, LPN; 0/62/3762 8:31 AM) Levothyroxine Sodium (25MCG Tablet, Oral) Active. Victoza (18MG /3ML Soln Pen-inj, Subcutaneous) Active. MetFORMIN HCl ER (500MG  Tablet ER 24HR, Oral) Active. Ondansetron HCl (4MG  Tablet, Oral) Active. Pantoprazole Sodium (40MG  Tablet DR, Oral) Active. Ramipril (10MG  Capsule, Oral) Active. Phentermine HCl (37.5MG  Tablet, Oral) Active. Hyoscyamine Sulfate (0.375MG  Tablet ER 12HR, Oral) Active. ZyrTEC (5MG  Tablet Chewable, Oral) Active. Aspirin (81MG  Tablet, Oral) Active. Medications Reconciled  Social History Alexis Regal, MD; 10/05/2017 10:01 AM) Alcohol use  Remotely quit alcohol use. Caffeine use  Carbonated beverages, Tea. No drug use  Tobacco use  Never smoker.  Family History Alexis Regal, MD; 10/05/2017 10:01 AM) Colon Polyps  Sister. Diabetes Mellitus  Brother. Heart Disease  Brother, Father. Respiratory Condition  Brother.  Pregnancy / Birth History Alexis Regal, MD; 10/05/2017 10:01 AM) Age at menarche  44 years. Gravida  2 Maternal age  10-25 Para  2  Other Problems Alexis Regal, MD; 10/05/2017 10:01 AM) Back Pain  Cholelithiasis  Diabetes Mellitus  Diverticulosis  Gastroesophageal Reflux Disease  Hypercholesterolemia  Kidney Stone  Thyroid Disease   Unspecified Diagnosis  Vitals (Alexis Massenburg LPN; 9/83/3825 0:53 AM) 10/05/2017 9:29 AM Weight: 203.5 lb Height: 64in Body Surface Area: 1.97 m Body Mass Index: 34.93 kg/m  Temp.: 98.24F(Oral)  Pulse: 79 (Regular)  Resp.: 18 (Unlabored)  P.OX: 98% (Room air) BP: 122/84 (Sitting, Left Arm, Standard)       Physical Exam Alexis Regal MD; 10/05/2017 9:58 AM) The physical exam findings are as follows: Note:See vital signs recorded above  GENERAL APPEARANCE Development: normal Nutritional status: normal Gross deformities: none  SKIN Rash, lesions, ulcers: none Induration, erythema: none Nodules: none palpable  EYES Conjunctiva and lids: normal Pupils: equal and reactive Iris: normal bilaterally  EARS, NOSE, MOUTH, THROAT External ears: no lesion or deformity External nose: no lesion or deformity Hearing: grossly normal Lips: no lesion or deformity Dentition: normal for age Oral mucosa: moist  NECK Symmetric: yes Trachea: midline Thyroid: no palpable nodules in the thyroid bed  CHEST Respiratory effort: normal Retraction or accessory muscle use: no Breath sounds: normal bilaterally Rales, rhonchi, wheeze: none  CARDIOVASCULAR Auscultation: regular rhythm, normal rate Murmurs: none Pulses: carotid and radial pulse 2+ palpable Lower extremity edema: none Lower extremity varicosities: none  BREAST Examination of the right breast shows normal nipple areolar complex. Breast parenchyma is relatively dense without discrete or dominant mass. Right axilla is free of adenopathy.  Left breast shows a normal nipple areolar complex which is slightly retracted. There is no discharge. Breast parenchyma is dense, particularly in the subareolar region. There is a palpable mass in the upper outer quadrant at approximately the 2:30 position at the edge of the breast. This measures about 2 cm in size, is firm, mobile, and nontender. There are no  overlying cutaneous changes. Axilla is free of adenopathy. Mass is better palpated in a sitting position and then in a recumbent position.  MUSCULOSKELETAL Station and gait: normal Digits and nails: no clubbing or cyanosis Muscle strength: grossly normal all extremities Range of motion: grossly normal all extremities Deformity: none  LYMPHATIC Cervical: none palpable Supraclavicular: none palpable  PSYCHIATRIC Oriented to person, place, and time: yes Mood and affect: normal for situation Judgment and insight: appropriate for situation    Assessment & Plan Alexis Regal MD; 10/05/2017 10:00 AM) BREAST CARCINOMA, FEMALE, LEFT (C50.912) CARCINOMA OF UPPER-OUTER QUADRANT OF LEFT BREAST IN FEMALE, ESTROGEN RECEPTOR POSITIVE (C50.412) Current Plans Patient presents with newly diagnosed left breast carcinoma. Patient had found this mass on self-examination. Diagnostic studies followed by needle biopsy confirmed invasive ductal carcinoma. Patient has been presented and discussed that the breast cancer conference.  I recommended proceeding with left partial mastectomy with sentinel lymph node biopsy of the axillary lymph nodes. This is also the recommendation of the breast cancer conference. Patient and I discussed this procedure. It can be performed as an outpatient procedure. We discussed the location of the surgical incisions. We discussed the need for adjuvant treatment with radiation following surgery. Patient will also likely require medical therapy postoperatively. We will arrange for consultation with radiation oncology and medical oncology. Patient would like to proceed with surgery in the near future.  The risks and benefits of the procedure have been discussed at length with the patient. The patient understands the proposed procedure, potential alternative treatments, and the course of recovery to be expected. All of the patient's questions have been answered at this  time. The patient wishes to proceed with surgery.   [name], [place] [facility] Office: [phone]

## 2017-10-10 ENCOUNTER — Encounter: Payer: Self-pay | Admitting: Hematology and Oncology

## 2017-10-10 NOTE — Progress Notes (Signed)
Labs reviewed. Blood glucose reviewed with Dr. Sabra Heck.  Will recheck DOS.

## 2017-10-11 ENCOUNTER — Encounter (HOSPITAL_BASED_OUTPATIENT_CLINIC_OR_DEPARTMENT_OTHER): Payer: Self-pay | Admitting: *Deleted

## 2017-10-11 ENCOUNTER — Other Ambulatory Visit: Payer: Self-pay

## 2017-10-11 ENCOUNTER — Encounter (HOSPITAL_BASED_OUTPATIENT_CLINIC_OR_DEPARTMENT_OTHER)
Admission: RE | Disposition: A | Discharge status: Home / Self Care | Payer: Self-pay | Source: Ambulatory Visit | Attending: Surgery

## 2017-10-11 ENCOUNTER — Ambulatory Visit (HOSPITAL_BASED_OUTPATIENT_CLINIC_OR_DEPARTMENT_OTHER): Payer: 59 | Admitting: Anesthesiology

## 2017-10-11 ENCOUNTER — Encounter (HOSPITAL_BASED_OUTPATIENT_CLINIC_OR_DEPARTMENT_OTHER): Payer: Self-pay | Admitting: Surgery

## 2017-10-11 ENCOUNTER — Ambulatory Visit (HOSPITAL_COMMUNITY)
Admission: RE | Admit: 2017-10-11 | Discharge: 2017-10-11 | Disposition: A | Discharge status: Home / Self Care | Payer: 59 | Source: Ambulatory Visit | Attending: Surgery | Admitting: Surgery

## 2017-10-11 ENCOUNTER — Ambulatory Visit (HOSPITAL_BASED_OUTPATIENT_CLINIC_OR_DEPARTMENT_OTHER)
Admission: RE | Admit: 2017-10-11 | Discharge: 2017-10-11 | Disposition: A | Discharge status: Home / Self Care | Payer: 59 | Source: Ambulatory Visit | Attending: Surgery | Admitting: Surgery

## 2017-10-11 ENCOUNTER — Ambulatory Visit (HOSPITAL_COMMUNITY): Payer: 59

## 2017-10-11 ENCOUNTER — Encounter (HOSPITAL_COMMUNITY): Payer: Self-pay

## 2017-10-11 DIAGNOSIS — E119 Type 2 diabetes mellitus without complications: Secondary | ICD-10-CM | POA: Insufficient documentation

## 2017-10-11 DIAGNOSIS — E210 Primary hyperparathyroidism: Secondary | ICD-10-CM | POA: Insufficient documentation

## 2017-10-11 DIAGNOSIS — Z888 Allergy status to other drugs, medicaments and biological substances status: Secondary | ICD-10-CM | POA: Diagnosis not present

## 2017-10-11 DIAGNOSIS — Z803 Family history of malignant neoplasm of breast: Secondary | ICD-10-CM | POA: Diagnosis not present

## 2017-10-11 DIAGNOSIS — Z79890 Hormone replacement therapy (postmenopausal): Secondary | ICD-10-CM | POA: Insufficient documentation

## 2017-10-11 DIAGNOSIS — C50912 Malignant neoplasm of unspecified site of left female breast: Secondary | ICD-10-CM | POA: Diagnosis not present

## 2017-10-11 DIAGNOSIS — Z170 Estrogen receptor positive status [ER+]: Secondary | ICD-10-CM

## 2017-10-11 DIAGNOSIS — K219 Gastro-esophageal reflux disease without esophagitis: Secondary | ICD-10-CM | POA: Insufficient documentation

## 2017-10-11 DIAGNOSIS — Z7982 Long term (current) use of aspirin: Secondary | ICD-10-CM | POA: Diagnosis not present

## 2017-10-11 DIAGNOSIS — E892 Postprocedural hypoparathyroidism: Secondary | ICD-10-CM | POA: Insufficient documentation

## 2017-10-11 DIAGNOSIS — C50412 Malignant neoplasm of upper-outer quadrant of left female breast: Secondary | ICD-10-CM

## 2017-10-11 DIAGNOSIS — Z79899 Other long term (current) drug therapy: Secondary | ICD-10-CM | POA: Insufficient documentation

## 2017-10-11 DIAGNOSIS — G8918 Other acute postprocedural pain: Secondary | ICD-10-CM | POA: Diagnosis not present

## 2017-10-11 DIAGNOSIS — Z7984 Long term (current) use of oral hypoglycemic drugs: Secondary | ICD-10-CM | POA: Insufficient documentation

## 2017-10-11 DIAGNOSIS — D0512 Intraductal carcinoma in situ of left breast: Secondary | ICD-10-CM | POA: Insufficient documentation

## 2017-10-11 DIAGNOSIS — E7800 Pure hypercholesterolemia, unspecified: Secondary | ICD-10-CM | POA: Diagnosis not present

## 2017-10-11 DIAGNOSIS — N6012 Diffuse cystic mastopathy of left breast: Secondary | ICD-10-CM | POA: Insufficient documentation

## 2017-10-11 DIAGNOSIS — N6042 Mammary duct ectasia of left breast: Secondary | ICD-10-CM | POA: Diagnosis not present

## 2017-10-11 DIAGNOSIS — I10 Essential (primary) hypertension: Secondary | ICD-10-CM | POA: Diagnosis not present

## 2017-10-11 HISTORY — DX: Personal history of urinary calculi: Z87.442

## 2017-10-11 HISTORY — PX: PARTIAL MASTECTOMY WITH AXILLARY SENTINEL LYMPH NODE BIOPSY: SHX6004

## 2017-10-11 LAB — GLUCOSE, CAPILLARY
Glucose-Capillary: 162 mg/dL — ABNORMAL HIGH (ref 70–99)
Glucose-Capillary: 132 mg/dL — ABNORMAL HIGH (ref 70–99)

## 2017-10-11 SURGERY — PARTIAL MASTECTOMY WITH AXILLARY SENTINEL LYMPH NODE BIOPSY
Anesthesia: General | Site: Breast | Laterality: Left

## 2017-10-11 MED ORDER — FENTANYL CITRATE (PF) 100 MCG/2ML IJ SOLN: 25.0000 ug | INTRAMUSCULAR | Status: DC | PRN

## 2017-10-11 MED ORDER — LIDOCAINE 2% (20 MG/ML) 5 ML SYRINGE
INTRAMUSCULAR | Status: AC
  Filled 2017-10-11: qty 5

## 2017-10-11 MED ORDER — LACTATED RINGERS IV SOLN
INTRAVENOUS | Status: DC
  Administered 2017-10-11 (×2): via INTRAVENOUS

## 2017-10-11 MED ORDER — BUPIVACAINE HCL (PF) 0.5 % IJ SOLN
INTRAMUSCULAR | Status: DC | PRN
  Administered 2017-10-11: 20 mL

## 2017-10-11 MED ORDER — BUPIVACAINE HCL (PF) 0.5 % IJ SOLN
INTRAMUSCULAR | Status: DC | PRN
  Administered 2017-10-11: 30 mL

## 2017-10-11 MED ORDER — PROPOFOL 10 MG/ML IV BOLUS
INTRAVENOUS | Status: DC | PRN
  Administered 2017-10-11: 150 mg via INTRAVENOUS

## 2017-10-11 MED ORDER — PROMETHAZINE HCL 25 MG/ML IJ SOLN: 6.2500 mg | INTRAMUSCULAR | Status: DC | PRN

## 2017-10-11 MED ORDER — CEFAZOLIN SODIUM-DEXTROSE 2-4 GM/100ML-% IV SOLN
2.0000 g | INTRAVENOUS | Status: AC
  Administered 2017-10-11: 2 g via INTRAVENOUS

## 2017-10-11 MED ORDER — SCOPOLAMINE 1 MG/3DAYS TD PT72
1.0000 | MEDICATED_PATCH | Freq: Once | TRANSDERMAL | Status: DC | PRN
  Administered 2017-10-11: 1.5 mg via TRANSDERMAL

## 2017-10-11 MED ORDER — SODIUM CHLORIDE 0.9 % IJ SOLN
INTRAMUSCULAR | Status: AC
  Filled 2017-10-11: qty 10

## 2017-10-11 MED ORDER — CEFAZOLIN SODIUM-DEXTROSE 2-4 GM/100ML-% IV SOLN
INTRAVENOUS | Status: AC
  Filled 2017-10-11: qty 100

## 2017-10-11 MED ORDER — PROPOFOL 10 MG/ML IV BOLUS
INTRAVENOUS | Status: AC
  Filled 2017-10-11: qty 20

## 2017-10-11 MED ORDER — FENTANYL CITRATE (PF) 100 MCG/2ML IJ SOLN
INTRAMUSCULAR | Status: AC
  Filled 2017-10-11: qty 2

## 2017-10-11 MED ORDER — CHLORHEXIDINE GLUCONATE CLOTH 2 % EX PADS: 6.0000 | MEDICATED_PAD | Freq: Once | CUTANEOUS | Status: DC

## 2017-10-11 MED ORDER — HYDROCODONE-ACETAMINOPHEN 5-325 MG PO TABS: 1.0000 | ORAL_TABLET | ORAL | 0 refills | Status: DC | PRN

## 2017-10-11 MED ORDER — PROPOFOL 500 MG/50ML IV EMUL
INTRAVENOUS | Status: DC | PRN
  Administered 2017-10-11: 25 ug/kg/min via INTRAVENOUS

## 2017-10-11 MED ORDER — MIDAZOLAM HCL 2 MG/2ML IJ SOLN
INTRAMUSCULAR | Status: AC
  Filled 2017-10-11: qty 2

## 2017-10-11 MED ORDER — OXYCODONE HCL 5 MG PO TABS
5.0000 mg | ORAL_TABLET | Freq: Once | ORAL | Status: AC | PRN
  Administered 2017-10-11: 5 mg via ORAL

## 2017-10-11 MED ORDER — ONDANSETRON HCL 4 MG/2ML IJ SOLN
INTRAMUSCULAR | Status: DC | PRN
  Administered 2017-10-11: 4 mg via INTRAVENOUS

## 2017-10-11 MED ORDER — SCOPOLAMINE 1 MG/3DAYS TD PT72
MEDICATED_PATCH | TRANSDERMAL | Status: AC
  Filled 2017-10-11: qty 1

## 2017-10-11 MED ORDER — METHYLENE BLUE 0.5 % INJ SOLN
INTRAVENOUS | Status: AC
  Filled 2017-10-11: qty 10

## 2017-10-11 MED ORDER — DEXAMETHASONE SODIUM PHOSPHATE 4 MG/ML IJ SOLN
INTRAMUSCULAR | Status: DC | PRN
  Administered 2017-10-11: 10 mg via INTRAVENOUS

## 2017-10-11 MED ORDER — DEXAMETHASONE SODIUM PHOSPHATE 10 MG/ML IJ SOLN
INTRAMUSCULAR | Status: AC
  Filled 2017-10-11: qty 1

## 2017-10-11 MED ORDER — OXYCODONE HCL 5 MG PO TABS
ORAL_TABLET | ORAL | Status: AC
  Filled 2017-10-11: qty 1

## 2017-10-11 MED ORDER — FENTANYL CITRATE (PF) 100 MCG/2ML IJ SOLN
50.0000 ug | INTRAMUSCULAR | Status: AC | PRN
  Administered 2017-10-11 (×2): 50 ug via INTRAVENOUS
  Administered 2017-10-11: 25 ug via INTRAVENOUS

## 2017-10-11 MED ORDER — BUPIVACAINE HCL (PF) 0.5 % IJ SOLN
INTRAMUSCULAR | Status: AC
  Filled 2017-10-11: qty 30

## 2017-10-11 MED ORDER — MIDAZOLAM HCL 2 MG/2ML IJ SOLN
1.0000 mg | INTRAMUSCULAR | Status: DC | PRN
  Administered 2017-10-11: 2 mg via INTRAVENOUS

## 2017-10-11 MED ORDER — LIDOCAINE HCL (CARDIAC) PF 100 MG/5ML IV SOSY
PREFILLED_SYRINGE | INTRAVENOUS | Status: DC | PRN
  Administered 2017-10-11: 60 mg via INTRAVENOUS

## 2017-10-11 MED ORDER — MEPERIDINE HCL 25 MG/ML IJ SOLN: 6.2500 mg | INTRAMUSCULAR | Status: DC | PRN

## 2017-10-11 MED ORDER — TECHNETIUM TC 99M SULFUR COLLOID FILTERED
1.0000 | Freq: Once | INTRAVENOUS | Status: AC | PRN
  Administered 2017-10-11: 1 via INTRADERMAL

## 2017-10-11 MED ORDER — ONDANSETRON HCL 4 MG/2ML IJ SOLN
INTRAMUSCULAR | Status: AC
  Filled 2017-10-11: qty 2

## 2017-10-11 MED ORDER — OXYCODONE HCL 5 MG/5ML PO SOLN: 5.0000 mg | Freq: Once | ORAL | Status: AC | PRN

## 2017-10-11 MED FILL — HYDROCODON-APAP 5-325: 5-325 | 2 days supply | Qty: 20 | Fill #0

## 2017-10-11 SURGICAL SUPPLY — 60 items
APL SKNCLS STERI-STRIP NONHPOA (GAUZE/BANDAGES/DRESSINGS) ×1
APPLIER CLIP 11 MED OPEN (CLIP) ×2
APR CLP MED 11 20 MLT OPN (CLIP) ×1
BENZOIN TINCTURE PRP APPL 2/3 (GAUZE/BANDAGES/DRESSINGS) ×2 IMPLANT
BINDER BREAST XLRG (GAUZE/BANDAGES/DRESSINGS) ×1 IMPLANT
BLADE HEX COATED 2.75 (ELECTRODE) ×2 IMPLANT
BLADE SURG 10 STRL SS (BLADE) ×2 IMPLANT
BLADE SURG 15 STRL LF DISP TIS (BLADE) ×2 IMPLANT
BLADE SURG 15 STRL SS (BLADE) ×2
CANISTER SUCT 1200ML W/VALVE (MISCELLANEOUS) ×2 IMPLANT
CHLORAPREP W/TINT 26ML (MISCELLANEOUS) ×2 IMPLANT
CLIP APPLIE 11 MED OPEN (CLIP) IMPLANT
CLIP VESOCCLUDE SM WIDE 6/CT (CLIP) ×1 IMPLANT
COVER BACK TABLE 60X90IN (DRAPES) ×2 IMPLANT
COVER MAYO STAND STRL (DRAPES) ×2 IMPLANT
COVER PROBE W GEL 5X96 (DRAPES) ×2 IMPLANT
DECANTER SPIKE VIAL GLASS SM (MISCELLANEOUS) IMPLANT
DEVICE DUBIN W/COMP PLATE 8390 (MISCELLANEOUS) ×1 IMPLANT
DRAIN HEMOVAC 1/8 X 5 (WOUND CARE) IMPLANT
DRAPE LAPAROSCOPIC ABDOMINAL (DRAPES) ×2 IMPLANT
DRAPE UTILITY XL STRL (DRAPES) ×2 IMPLANT
ELECT REM PT RETURN 9FT ADLT (ELECTROSURGICAL) ×2
ELECTRODE REM PT RTRN 9FT ADLT (ELECTROSURGICAL) ×1 IMPLANT
EVACUATOR SILICONE 100CC (DRAIN) IMPLANT
GLOVE BIOGEL PI IND STRL 7.0 (GLOVE) IMPLANT
GLOVE BIOGEL PI INDICATOR 7.0 (GLOVE) ×1
GLOVE ECLIPSE 6.5 STRL STRAW (GLOVE) ×1 IMPLANT
GLOVE EXAM NITRILE PF MED BLUE (GLOVE) ×1 IMPLANT
GLOVE SURG ORTHO 8.0 STRL STRW (GLOVE) ×2 IMPLANT
GOWN STRL REUS W/ TWL LRG LVL3 (GOWN DISPOSABLE) ×1 IMPLANT
GOWN STRL REUS W/ TWL XL LVL3 (GOWN DISPOSABLE) ×1 IMPLANT
GOWN STRL REUS W/TWL LRG LVL3 (GOWN DISPOSABLE)
GOWN STRL REUS W/TWL XL LVL3 (GOWN DISPOSABLE) ×4
KIT MARKER MARGIN INK (KITS) ×1 IMPLANT
NDL HYPO 25X1 1.5 SAFETY (NEEDLE) ×2 IMPLANT
NDL SAFETY ECLIPSE 18X1.5 (NEEDLE) ×1 IMPLANT
NEEDLE HYPO 18GX1.5 SHARP (NEEDLE)
NEEDLE HYPO 25X1 1.5 SAFETY (NEEDLE) ×2 IMPLANT
NS IRRIG 1000ML POUR BTL (IV SOLUTION) ×1 IMPLANT
PACK BASIN DAY SURGERY FS (CUSTOM PROCEDURE TRAY) ×2 IMPLANT
PAD ALCOHOL SWAB (MISCELLANEOUS) ×2 IMPLANT
PENCIL BUTTON HOLSTER BLD 10FT (ELECTRODE) ×2 IMPLANT
PIN SAFETY STERILE (MISCELLANEOUS) IMPLANT
SLEEVE SCD COMPRESS KNEE MED (MISCELLANEOUS) ×1 IMPLANT
SPONGE LAP 18X18 RF (DISPOSABLE) ×1 IMPLANT
STAPLER VISISTAT 35W (STAPLE) ×1 IMPLANT
STRIP CLOSURE SKIN 1/2X4 (GAUZE/BANDAGES/DRESSINGS) ×2 IMPLANT
SUT ETHILON 3 0 FSL (SUTURE) IMPLANT
SUT MNCRL AB 3-0 PS2 18 (SUTURE) IMPLANT
SUT MNCRL AB 4-0 PS2 18 (SUTURE) ×2 IMPLANT
SUT VIC AB 2-0 SH 27 (SUTURE)
SUT VIC AB 2-0 SH 27XBRD (SUTURE) IMPLANT
SUT VIC AB 3-0 SH 27 (SUTURE) ×2
SUT VIC AB 3-0 SH 27X BRD (SUTURE) IMPLANT
SUT VICRYL 3-0 CR8 SH (SUTURE) IMPLANT
SYR CONTROL 10ML LL (SYRINGE) ×3 IMPLANT
TOWEL GREEN STERILE FF (TOWEL DISPOSABLE) ×3 IMPLANT
TOWEL OR NON WOVEN STRL DISP B (DISPOSABLE) ×1 IMPLANT
TUBE CONNECTING 20X1/4 (TUBING) ×2 IMPLANT
YANKAUER SUCT BULB TIP NO VENT (SUCTIONS) ×2 IMPLANT

## 2017-10-11 NOTE — Anesthesia Procedure Notes (Signed)
Anesthesia Regional Block: Pectoralis block   Pre-Anesthetic Checklist: ,, timeout performed, Correct Patient, Correct Site, Correct Laterality, Correct Procedure, Correct Position, site marked, Risks and benefits discussed,  Surgical consent,  Pre-op evaluation,  At surgeon's request and post-op pain management  Laterality: Left  Prep: chloraprep       Needles:   Needle Type: Stimiplex     Needle Length: 9cm      Additional Needles:   Procedures:,,,, ultrasound used (permanent image in chart),,,,  Narrative:  Start time: 10/11/2017 9:38 AM End time: 10/11/2017 9:43 AM Injection made incrementally with aspirations every 5 mL.  Performed by: Personally  Anesthesiologist: Nolon Nations, MD  Additional Notes: Patient tolerated well. Good fascial spread noted.

## 2017-10-11 NOTE — Discharge Instructions (Signed)

## 2017-10-11 NOTE — Anesthesia Preprocedure Evaluation (Addendum)
Anesthesia Evaluation  Patient identified by MRN, date of birth, ID band Patient awake    Reviewed: Allergy & Precautions, NPO status , Patient's Chart, lab work & pertinent test results  History of Anesthesia Complications (+) PONV and history of anesthetic complications  Airway Mallampati: III  TM Distance: >3 FB Neck ROM: Full    Dental  (+) Dental Advisory Given, Teeth Intact   Pulmonary pneumonia,    Pulmonary exam normal        Cardiovascular hypertension, Pt. on medications Normal cardiovascular exam Rhythm:Regular Rate:Normal     Neuro/Psych    GI/Hepatic   Endo/Other  diabetes, Type 2, Oral Hypoglycemic Agents  Renal/GU      Musculoskeletal   Abdominal   Peds  Hematology   Anesthesia Other Findings   Reproductive/Obstetrics                            Anesthesia Physical  Anesthesia Plan  ASA: II  Anesthesia Plan: General   Post-op Pain Management: GA combined w/ Regional for post-op pain   Induction: Intravenous  PONV Risk Score and Plan: 4 or greater and Dexamethasone, Ondansetron, Midazolam and Treatment may vary due to age or medical condition  Airway Management Planned: LMA  Additional Equipment:   Intra-op Plan:   Post-operative Plan: Extubation in OR  Informed Consent: I have reviewed the patients History and Physical, chart, labs and discussed the procedure including the risks, benefits and alternatives for the proposed anesthesia with the patient or authorized representative who has indicated his/her understanding and acceptance.   Dental advisory given  Plan Discussed with: CRNA  Anesthesia Plan Comments:         Anesthesia Quick Evaluation

## 2017-10-11 NOTE — Op Note (Signed)
NAME: Alexis [name], Alexis [name] MEDICAL RECORD [id number] ACCOUNT [id number] DATE OF BIRTH:1955/06/12 FACILITY: [facility] LOCATION: [facility] PHYSICIAN:Appollonia Klee Leeanne Mannan, MD  OPERATIVE REPORT  DATE OF PROCEDURE:  10/11/2017  PREOPERATIVE DIAGNOSIS:  Left breast carcinoma.  POSTOPERATIVE DIAGNOSIS:  Left breast carcinoma.  PROCEDURE:  Left partial mastectomy with axillary sentinel lymph node biopsy.  SURGEON:  Earnstine Regal, MD  ANESTHESIA:  General.  ESTIMATED BLOOD LOSS:  25 mL  PREPARATION:  ChloraPrep.  COMPLICATIONS:  None.  INDICATIONS:  The patient is a 62 year old female diagnosed with left breast carcinoma.  She now comes to surgery for partial mastectomy and sentinel lymph node biopsy.  Procedures done at OR #[facility] at the [facility].  DESCRIPTION OF PROCEDURE:  The patient was brought to the operating room and placed in supine position on the operating room table.  Following administration of general anesthesia, the patient was positioned and then prepped and draped in the usual  aseptic fashion.  The patient was scanned with the Neoprobe and the area of increased radioactivity in the axilla representing sentinel lymph nodes was identified.  A transverse incision was made on the left lateral chest wall just above the level of the  palpable tumor in the left breast.  Dissection was carried in the subcutaneous tissues.  Using palpation as a guide, the breast tissue in the lateral left breast is excised with the tumor located centrally and what appears to be a wide margin of normal  tissue around the mass.  Dissection was carried down to the chest wall.  Hemostasis was achieved with the electrocautery.  The entire mass is excised with a wide margin.  It was marked with marking paint for orientation purposes.  It was then submitted  to pathology for review.  Margins of the dissection were marked with medium Ligaclips.  Good hemostasis was noted.  Next, the Neoprobe was used  to localize radioactive lymph nodes in the left axilla.  These lymph nodes were mobilized.  They were excised using the electrocautery for hemostasis.  Vascular structures were divided between medium Ligaclips.  Two separate  tissue samples are excised from the left axilla representing the sentinel lymph nodes as the samples were both radioactive.  Additional scanning of the axilla failed to identify any other increased radioactivity.  Good hemostasis was obtained throughout the operative field.  Subcutaneous tissues were closed with interrupted 3-0 Vicryl sutures.  Skin was anesthetized with local Marcaine.  Skin edges were reapproximated with a running 4-0 Monocryl subcuticular  suture.  Wound was washed and dried and benzoin and Steri-Strips were applied.  Sterile dressings were applied.  A breast binder was applied.  The patient is awakened from anesthesia and brought to the recovery room.  The patient tolerated the procedure  well.  [name], [place] [facility] Office: [phone]   TN/NUANCE  D:10/11/2017 T:10/11/2017 JOB:[id number]

## 2017-10-11 NOTE — Brief Op Note (Signed)
10/11/2017  11:52 AM  PATIENT:  Alexis [name]  62 y.o. female  PRE-OPERATIVE DIAGNOSIS:  LEFT BREAST CANCER  POST-OPERATIVE DIAGNOSIS:  LEFT BREAST CANCER  PROCEDURE:  Procedure(s): LEFT BREAST PARTIAL MASTECTOMY WITH AXILLARY SENTINEL LYMPH NODE BIOPSY (Left)  SURGEON:  Surgeon(s) and Role:    * [name], MD - Primary  ASSISTANTS: none   ANESTHESIA:   general  EBL:  25 cc  BLOOD ADMINISTERED:none  DRAINS: none   LOCAL MEDICATIONS USED:  MARCAINE     SPECIMEN:  Excision  DISPOSITION OF SPECIMEN:  PATHOLOGY  COUNTS:  YES  TOURNIQUET:  * No tourniquets in log *  DICTATION: .Other Dictation: Dictation Number [id number]  PLAN OF CARE: Discharge to home after PACU  PATIENT DISPOSITION:  PACU - hemodynamically stable.   Delay start of Pharmacological VTE agent (>24hrs) due to surgical blood loss or risk of bleeding: yes  [name], MD [facility] Office: [phone]

## 2017-10-11 NOTE — Op Note (Deleted)
  The note originally documented on this encounter has been moved the the encounter in which it belongs.  

## 2017-10-11 NOTE — Anesthesia Postprocedure Evaluation (Signed)
Anesthesia Post Note  Patient: Alexis [name]  Procedure(s) Performed: LEFT BREAST PARTIAL MASTECTOMY WITH AXILLARY SENTINEL LYMPH NODE BIOPSY (Left Breast)     Patient location during evaluation: PACU Anesthesia Type: General Level of consciousness: awake and alert Pain management: pain level controlled Vital Signs Assessment: post-procedure vital signs reviewed and stable Respiratory status: spontaneous breathing, nonlabored ventilation, respiratory function stable and patient connected to nasal cannula oxygen Cardiovascular status: blood pressure returned to baseline and stable Postop Assessment: no apparent nausea or vomiting Anesthetic complications: no    Last Vitals:  Vitals:   10/11/17 1230 10/11/17 1301  BP: (!) 147/59 (!) (P) 144/68  Pulse: 64 (P) 73  Resp: 16 (P) 18  Temp:  (P) 37 C  SpO2: 97% (P) 97%    Last Pain:  Vitals:   10/11/17 1301  TempSrc:   PainSc: (P) 2                  Sheriann Newmann L Rody Keadle

## 2017-10-11 NOTE — Transfer of Care (Signed)
Immediate Anesthesia Transfer of Care Note  Patient: Alexis [name]  Procedure(s) Performed: LEFT BREAST PARTIAL MASTECTOMY WITH AXILLARY SENTINEL LYMPH NODE BIOPSY (Left Breast)  Patient Location: PACU  Anesthesia Type:GA combined with regional for post-op pain  Level of Consciousness: sedated and responds to stimulation  Airway & Oxygen Therapy: Patient Spontanous Breathing and Patient connected to face mask oxygen  Post-op Assessment: Report given to RN and Post -op Vital signs reviewed and stable  Post vital signs: Reviewed and stable  Last Vitals:  Vitals Value Taken Time  BP 143/63 10/11/2017 12:01 PM  Temp    Pulse 70 10/11/2017 12:03 PM  Resp 20 10/11/2017 12:03 PM  SpO2 96 % 10/11/2017 12:03 PM  Vitals shown include unvalidated device data.  Last Pain:  Vitals:   10/11/17 0815  TempSrc:   PainSc: 0-No pain      Patients Stated Pain Goal: 3 (49/44/96 7591)  Complications: No apparent anesthesia complications

## 2017-10-11 NOTE — Interval H&P Note (Signed)
History and Physical Interval Note:  10/11/2017 10:36 AM  Alexis [name]  has presented today for surgery, with the diagnosis of LEFT BREAST CANCER.  The various methods of treatment have been discussed with the patient and family. After consideration of risks, benefits and other options for treatment, the patient has consented to    Procedure(s): LEFT BREAST PARTIAL MASTECTOMY WITH AXILLARY SENTINEL LYMPH NODE BIOPSY (Left) as a surgical intervention .    The patient's history has been reviewed, patient examined, no change in status, stable for surgery.  I have reviewed the patient's chart and labs.  Questions were answered to the patient's satisfaction.    [name], [place] [facility] Office: [place]

## 2017-10-11 NOTE — Anesthesia Procedure Notes (Signed)
Procedure Name: LMA Insertion Date/Time: 10/11/2017 10:55 AM Performed by: Lyndee Leo, CRNA Pre-anesthesia Checklist: Patient identified, Emergency Drugs available, Suction available and Patient being monitored Patient Re-evaluated:Patient Re-evaluated prior to induction Oxygen Delivery Method: Circle system utilized Preoxygenation: Pre-oxygenation with 100% oxygen Induction Type: IV induction Ventilation: Mask ventilation without difficulty LMA: LMA inserted LMA Size: 3.0 Number of attempts: 1 Airway Equipment and Method: Bite block Placement Confirmation: positive ETCO2 Tube secured with: Tape Dental Injury: Teeth and Oropharynx as per pre-operative assessment

## 2017-10-11 NOTE — Progress Notes (Signed)
Assisted Dr. Lissa Hoard with left, ultrasound guided, pectoralis block and nuc med tech with nuc med inj. Side rails up, monitors on throughout procedure. See vital signs in flow sheet. Tolerated Procedure well.

## 2017-10-12 ENCOUNTER — Encounter (HOSPITAL_BASED_OUTPATIENT_CLINIC_OR_DEPARTMENT_OTHER): Payer: Self-pay | Admitting: Surgery

## 2017-10-14 ENCOUNTER — Encounter: Payer: Self-pay | Admitting: Radiation Oncology

## 2017-10-14 NOTE — Progress Notes (Signed)
Location of Breast Cancer: left breast tail of Spence  Histology per Pathology Report: 10/11/17:  Diagnosis 1. Breast, lumpectomy, Left - INVASIVE DUCTAL CARCINOMA, NOTTINGHAM GRADE 2 OF 3, 2.1 CM - MARGINS UNINVOLVED BY CARCINOMA (0.1 CM; ANTERIOR MARGIN) - USUAL DUCTAL HYPERPLASIA, FIBROCYSTIC CHANGES AND DUCT ECTASIA - PREVIOUS BIOPSY SITE CHANGES PRESENT - SEE ONCOLOGY TABLE BELOW 2. Lymph node, sentinel, biopsy, Left axilla - NO CARCINOMA IDENTIFIED IN ONE LYMPH NODE (0/1) 3. Lymph node, sentinel, biopsy, Left axilla - NO CARCINOMA IDENTIFIED IN ONE LYMPH NODE (0/1) 4. Lymph node, sentinel, biopsy, Left axilla - NO CARCINOMA IDENTIFIED IN ONE LYMPH NODE (0/1)  Receptor Status: ER(95%), PR (80%), Her2-neu (negative), Ki-(10%)  Did patient present with symptoms (if so, please note symptoms) or was this found on screening mammography?: per 09/16/17: Palpable mass left breast tail of Spence.  Last mammogram 2016.  Will schedule diagnostic mammography and ultrasound.  Differential reviewed with the patient and importance of follow-up stressed.  Past/Anticipated interventions by surgeon, if any: 10/11/17: PROCEDURE:  Left partial mastectomy with axillary sentinel lymph node biopsy.  SURGEON:  Earnstine Regal, MD  Past/Anticipated interventions by medical oncology, if any: Chemotherapy future appt with Dr. Lindi Adie 10/25/17  Lymphedema issues, if any:  Pt denies   Pain issues, if any:  Pt reports intermittent burning pain in left axilla. Pt rates pain in axilla as 3/10.  SAFETY ISSUES:  Prior radiation? No  Pacemaker/ICD? No  Possible current pregnancy? No  Is the patient on methotrexate? No  Current Complaints / other details:  Pt presents today for initial consult with Dr. Sondra Come for Radiation Oncology. Pt is accompanied by daughter.     Loma Sousa, RN 10/19/2017,10:27 AM

## 2017-10-19 ENCOUNTER — Ambulatory Visit
Admission: RE | Admit: 2017-10-19 | Discharge: 2017-10-19 | Disposition: A | Discharge status: Home / Self Care | Payer: 59 | Source: Ambulatory Visit | Attending: Radiation Oncology | Admitting: Radiation Oncology

## 2017-10-19 ENCOUNTER — Encounter: Payer: Self-pay | Admitting: Radiation Oncology

## 2017-10-19 ENCOUNTER — Other Ambulatory Visit: Payer: Self-pay

## 2017-10-19 VITALS — BP 158/72 | HR 70 | Temp 98.6°F | Resp 16 | Ht 64.0 in | Wt 206.0 lb

## 2017-10-19 VITALS — BP 158/72 | HR 70 | Temp 98.6°F | Resp 16 | Wt 206.0 lb

## 2017-10-19 DIAGNOSIS — Z803 Family history of malignant neoplasm of breast: Secondary | ICD-10-CM | POA: Diagnosis not present

## 2017-10-19 DIAGNOSIS — Z170 Estrogen receptor positive status [ER+]: Secondary | ICD-10-CM

## 2017-10-19 DIAGNOSIS — E119 Type 2 diabetes mellitus without complications: Secondary | ICD-10-CM | POA: Insufficient documentation

## 2017-10-19 DIAGNOSIS — Z9889 Other specified postprocedural states: Secondary | ICD-10-CM | POA: Diagnosis not present

## 2017-10-19 DIAGNOSIS — Z7982 Long term (current) use of aspirin: Secondary | ICD-10-CM | POA: Insufficient documentation

## 2017-10-19 DIAGNOSIS — Z794 Long term (current) use of insulin: Secondary | ICD-10-CM | POA: Diagnosis not present

## 2017-10-19 DIAGNOSIS — C50412 Malignant neoplasm of upper-outer quadrant of left female breast: Secondary | ICD-10-CM | POA: Diagnosis not present

## 2017-10-19 NOTE — Progress Notes (Signed)
Radiation Oncology         [phone] ________________________________  Initial Outpatient Consultation  Name: Alexis [name] MRN: [id number]  Date: 10/19/2017  DOB: Jun 23, 1955  CC:[name], DO  [name], MD   REFERRING PHYSICIAN: [name], MD  DIAGNOSIS: The encounter diagnosis was Carcinoma of upper-outer quadrant of left breast in female, estrogen receptor positive ([place]).   Stage pT2, pN0, Left Breast, UOQ Invasive Ductal Carcinoma, ER (+), PR (+), HER2 (neg), Nottingham grade 2.   HISTORY OF PRESENT ILLNESS::Alexis [name] is a 62 y.o. female who is presenting to the office today for evaluation of left breast cancer. She is accompanied by her daughter today. She notes that she was noticed a lump to her left axilla while taking a shower. She has associated burning/soreness sensation localized under her left axilla s/p surgery. She denies numbness, tingling, swelling, headaches, vision problems, and any other symptoms. She has an appointment with Dr. Lindi Adie on 10/25/2017. 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 She has had a cholecystectomy, hysterectomy with BSO sparring, colectomy (due to polyps and over 12 inches removed). She has her 104, Niece, as well as distant cousins with a hx of breast cancer.   She had a diagnostic bilateral mammogram with bilateral ultrasonography on 09/22/2017 that showed: Suspicious palpable 2.0 cm mass in the 2:30 position of the left breast near the axillary tail. No evidence of malignancy in the right breast.  She has underwent left needle core biopsy on 09/26/2017 that showed: Breast, left, needle core biopsy, upper outer quadrant, 2:30 position with invasive ductal carcinoma, grade 1. Prognostic indicators significant for: estrogen receptor, 95% positive and progesterone receptor, 80% positive, both with strong staining intensity. Proliferation marker Ki67 at 10%. HER2  negative.  She also had a left lumpectomy on 10/11/2017 that showed: Breast, lumpectomy, Left with invasive ductal carcinoma, nottingham grade 2 of 3, 2.1 cm. Margins uninvolved by carcinoma (0.1 cm; anterior margin). Usual ductal hyperplasia, fibrocystic changes and duct ectasia. Previous biopsy site changes present. Of the 3 lymph nodes biopsied via sentinel node biopsy, all 3 were without carcinoma identified.   PREVIOUS RADIATION THERAPY: No  PAST MEDICAL HISTORY:  has a past medical history of ASCUS of cervix with negative high risk HPV (09/2016), Diabetes mellitus without complication ([place]), Diverticulitis, Facial trauma, Fall, History of bronchitis, History of kidney stones, History of urinary tract infection, Hyperparathyroidism ([place]), Parathyroid tumor, Plantar fasciitis, PONV (postoperative nausea and vomiting), VAIN I (vaginal intraepithelial neoplasia grade I) (04/2012, 09/2017), and Yeast infection.    PAST SURGICAL HISTORY: Past Surgical History:  Procedure Laterality Date  . ABDOMINAL HYSTERECTOMY    . APPENDECTOMY    . CARPAL TUNNEL RELEASE     right and left   . CHOLECYSTECTOMY    . COLON SURGERY    . colonscopy     . DILATION AND CURETTAGE OF UTERUS    . HYSTEROSCOPY  2003  . LAVH  2004   leiomyoma/irregular bleeding  . PARATHYROIDECTOMY N/A 02/21/2015   Procedure: RIGHT PARATHYROIDECTOMY;  Surgeon: [name], MD;  Location: [facility];  Service: General;  Laterality: N/A;  . PARTIAL MASTECTOMY WITH AXILLARY SENTINEL LYMPH NODE BIOPSY Left 10/11/2017   Procedure: LEFT BREAST PARTIAL MASTECTOMY WITH AXILLARY SENTINEL LYMPH NODE BIOPSY;  Surgeon: [name], MD;  Location: [facility];  Service: General;  Laterality: Left;  . TUBAL LIGATION      FAMILY HISTORY: family history includes  Cancer in her brother; Diabetes in her brother; Heart attack in her father.  SOCIAL HISTORY:  reports that she has never smoked. She has never used smokeless tobacco. She reports  that she does not drink alcohol or use drugs.  ALLERGIES: Canagliflozin; Empagliflozin; Metformin hcl; and Parlodel [bromocriptine mesylate]  MEDICATIONS:  Current Outpatient Medications  Medication Sig Dispense Refill  . ACCU-CHEK FASTCLIX LANCETS MISC USE 3 TIMES A DAY AS DIRECTED  3  . ACCU-CHEK GUIDE test strip USE 3 TIMES A DAY AS DIRECTED  3  . aspirin 81 MG tablet Take 81 mg by mouth every other day.     . Cholecalciferol (VITAMIN D PO) Take by mouth.    Marland Kitchen HYDROcodone-acetaminophen (NORCO/VICODIN) 5-325 MG tablet Take 1-2 tablets by mouth every 4 (four) hours as needed for moderate pain. 20 tablet 0  . insulin glargine (LANTUS) 100 UNIT/ML injection Inject 60 Units into the skin at bedtime.     . insulin lispro (HUMALOG) 100 UNIT/ML injection Inject 28 Units into the skin 3 (three) times daily before meals.     Jiles Prows SR 0.375 MG 12 hr tablet Take 0.375 mg by mouth every 12 (twelve) hours as needed.   0  . UNIFINE PENTIPS 31G X 5 MM MISC USE TWICE A DAY AS DIRECTED  2  . meloxicam (MOBIC) 7.5 MG tablet Take 1 tablet (7.5 mg total) by mouth daily. (Patient not taking: Reported on 10/19/2017) 30 tablet 0  . phentermine (ADIPEX-P) 37.5 MG tablet Take 37.5 mg by mouth daily.  3   No current facility-administered medications for this encounter.     REVIEW OF SYSTEMS:  A 10+ POINT REVIEW OF SYSTEMS WAS OBTAINED including neurology, dermatology, psychiatry, cardiac, respiratory, lymph, extremities, GI, GU, musculoskeletal, constitutional, reproductive, HEENT. All pertinent positives are noted in the HPI. All others are negative.    PHYSICAL EXAM:  weight is 206 lb (93.4 kg). Her oral temperature is 98.6 F (37 C). Her blood pressure is 158/72 (abnormal) and her pulse is 70. Her respiration is 16 and oxygen saturation is 95%.   General: Alert and oriented, in no acute distress HEENT: Head is normocephalic. Extraocular movements are intact. Oropharynx is clear. Neck: Neck is supple, no  palpable cervical or supraclavicular lymphadenopathy. Heart: Regular in rate and rhythm with no murmurs, rubs, or gallops. Chest: Clear to auscultation bilaterally, with no rhonchi, wheezes, or rales. Abdomen: Soft, nontender, nondistended, with no rigidity or guarding. Extremities: No cyanosis or edema. Lymphatics: see Neck Exam Skin: No concerning lesions. Musculoskeletal: symmetric strength and muscle tone throughout. Neurologic: Cranial nerves II through XII are grossly intact. No obvious focalities. Speech is fluent. Coordination is intact. Psychiatric: Judgment and insight are intact. Affect is appropriate. Right breast with no palpable mass, nipple discharge, or bleeding. Left breast, patient's nipple is inverted, which the patient states occurred prior to her surgery. She has a well healing scar in the UOQ with steri strips in place. No signs of infection or drainage.   ECOG = 1  0 - Asymptomatic (Fully active, able to carry on all predisease activities without restriction)  1 - Symptomatic but completely ambulatory (Restricted in physically strenuous activity but ambulatory and able to carry out work of a light or sedentary nature. For example, light housework, office work)  2 - Symptomatic, <50% in bed during the day (Ambulatory and capable of all self care but unable to carry out any work activities. Up and about more than 50% of waking hours)  3 - Symptomatic, >50% in bed, but not bedbound (Capable of only limited self-care, confined to bed or chair 50% or more of waking hours)  4 - Bedbound (Completely disabled. Cannot carry on any self-care. Totally confined to bed or chair)  5 - Death   Eustace Pen MM, Creech RH, Tormey DC, et al. [phone]). "Toxicity and response criteria of the [facility]". [place] Oncol. 5 (6): 649-55  LABORATORY DATA:  Lab Results  Component Value Date   WBC 17.2 (H) 12/06/2016   HGB 16.5 (H) 12/06/2016   HCT 51.6 (H) 12/06/2016    MCV 87.6 12/06/2016   PLT 330 12/06/2016   NEUTROABS 14.8 (H) 12/06/2016   Lab Results  Component Value Date   NA 138 10/07/2017   K 4.2 10/07/2017   CL 106 10/07/2017   CO2 24 10/07/2017   GLUCOSE 253 (H) 10/07/2017   CREATININE 0.60 10/07/2017   CALCIUM 9.0 10/07/2017      RADIOGRAPHY: Nm Sentinel Node Inj-no Rpt (breast)  Result Date: 10/11/2017 Sulfur colloid was injected by the nuclear medicine technologist for melanoma sentinel node.   US Breast Ltd Uni Left Inc Axilla  Result Date: 09/22/2017 CLINICAL DATA:  61 year old patient referred for annual examination and evaluation of a palpable mass in the upper outer left breast, axillary tail of Spence, discovered on recent clinical physical exam. EXAM: DIGITAL DIAGNOSTIC BILATERAL MAMMOGRAM WITH CAD AND TOMO ULTRASOUND BILATERAL BREAST COMPARISON:  July 10, 2014 ACR Breast Density Category c: The breast tissue is heterogeneously dense, which may obscure small masses. FINDINGS: Metallic skin marker was placed in the region of palpable concern in the far upper outer left breast. This region is best visualized in the MLO projection. Deep to the skin marker is an irregular mass in the posterior third of the breast parenchyma. No suspicious microcalcification or additional mass identified is in the left breast. In the right breast, there are a few circumscribed subcentimeter nodules. No irregular mass, distortion, or suspicious microcalcification. Mammographic images were processed with CAD. On physical exam, I do palpate a focal firm mass in the 2:30 position of the left breast approximately 6-7 cm from the nipple. Targeted ultrasound is performed, showing a hypoechoic irregular mass with angulated margins at 2:30 position measuring 2.0 x 1.8 x 1.5 cm. Vascular flow is detected within the mass. Ultrasound of the left axilla shows normal axillary lymph nodes with preserved fatty hila and thin cortices. No lymphadenopathy is detected.  Ultrasound of the retroareolar right breast shows several subcentimeter simple and mildly complicated cysts, corresponding with the circumscribed masses seen on mammography. No suspicious mass in the retroareolar right breast. IMPRESSION: Suspicious palpable 2.0 cm mass in the 2:30 position of the left breast near the axillary tail. No evidence of malignancy in the right breast. RECOMMENDATION: Ultrasound-guided biopsy of the left breast is recommended and is being scheduled for the patient. I have discussed the findings and recommendations with the patient. Results were also provided in writing at the conclusion of the visit. If applicable, a reminder letter will be sent to the patient regarding the next appointment. BI-RADS CATEGORY  5: Highly suggestive of malignancy. Electronically Signed   By: Curlene Dolphin M.D.   On: 09/22/2017 13:50   US Breast Ltd Uni Right Inc Axilla  Result Date: 09/22/2017 CLINICAL DATA:  62 year old patient referred for annual examination and evaluation of a palpable mass in the upper outer left breast, axillary tail of Spence, discovered on recent clinical physical exam.  EXAM: DIGITAL DIAGNOSTIC BILATERAL MAMMOGRAM WITH CAD AND TOMO ULTRASOUND BILATERAL BREAST COMPARISON:  July 10, 2014 ACR Breast Density Category c: The breast tissue is heterogeneously dense, which may obscure small masses. FINDINGS: Metallic skin marker was placed in the region of palpable concern in the far upper outer left breast. This region is best visualized in the MLO projection. Deep to the skin marker is an irregular mass in the posterior third of the breast parenchyma. No suspicious microcalcification or additional mass identified is in the left breast. In the right breast, there are a few circumscribed subcentimeter nodules. No irregular mass, distortion, or suspicious microcalcification. Mammographic images were processed with CAD. On physical exam, I do palpate a focal firm mass in the 2:30 position of  the left breast approximately 6-7 cm from the nipple. Targeted ultrasound is performed, showing a hypoechoic irregular mass with angulated margins at 2:30 position measuring 2.0 x 1.8 x 1.5 cm. Vascular flow is detected within the mass. Ultrasound of the left axilla shows normal axillary lymph nodes with preserved fatty hila and thin cortices. No lymphadenopathy is detected. Ultrasound of the retroareolar right breast shows several subcentimeter simple and mildly complicated cysts, corresponding with the circumscribed masses seen on mammography. No suspicious mass in the retroareolar right breast. IMPRESSION: Suspicious palpable 2.0 cm mass in the 2:30 position of the left breast near the axillary tail. No evidence of malignancy in the right breast. RECOMMENDATION: Ultrasound-guided biopsy of the left breast is recommended and is being scheduled for the patient. I have discussed the findings and recommendations with the patient. Results were also provided in writing at the conclusion of the visit. If applicable, a reminder letter will be sent to the patient regarding the next appointment. BI-RADS CATEGORY  5: Highly suggestive of malignancy. Electronically Signed   By: Curlene Dolphin M.D.   On: 09/22/2017 13:50   Mm Diag Breast Tomo Bilateral  Result Date: 09/22/2017 CLINICAL DATA:  62 year old patient referred for annual examination and evaluation of a palpable mass in the upper outer left breast, axillary tail of Spence, discovered on recent clinical physical exam. EXAM: DIGITAL DIAGNOSTIC BILATERAL MAMMOGRAM WITH CAD AND TOMO ULTRASOUND BILATERAL BREAST COMPARISON:  July 10, 2014 ACR Breast Density Category c: The breast tissue is heterogeneously dense, which may obscure small masses. FINDINGS: Metallic skin marker was placed in the region of palpable concern in the far upper outer left breast. This region is best visualized in the MLO projection. Deep to the skin marker is an irregular mass in the posterior  third of the breast parenchyma. No suspicious microcalcification or additional mass identified is in the left breast. In the right breast, there are a few circumscribed subcentimeter nodules. No irregular mass, distortion, or suspicious microcalcification. Mammographic images were processed with CAD. On physical exam, I do palpate a focal firm mass in the 2:30 position of the left breast approximately 6-7 cm from the nipple. Targeted ultrasound is performed, showing a hypoechoic irregular mass with angulated margins at 2:30 position measuring 2.0 x 1.8 x 1.5 cm. Vascular flow is detected within the mass. Ultrasound of the left axilla shows normal axillary lymph nodes with preserved fatty hila and thin cortices. No lymphadenopathy is detected. Ultrasound of the retroareolar right breast shows several subcentimeter simple and mildly complicated cysts, corresponding with the circumscribed masses seen on mammography. No suspicious mass in the retroareolar right breast. IMPRESSION: Suspicious palpable 2.0 cm mass in the 2:30 position of the left breast near the axillary tail. No  evidence of malignancy in the right breast. RECOMMENDATION: Ultrasound-guided biopsy of the left breast is recommended and is being scheduled for the patient. I have discussed the findings and recommendations with the patient. Results were also provided in writing at the conclusion of the visit. If applicable, a reminder letter will be sent to the patient regarding the next appointment. BI-RADS CATEGORY  5: Highly suggestive of malignancy. Electronically Signed   By: Curlene Dolphin M.D.   On: 09/22/2017 13:50   Mm Clip Placement Left  Result Date: 09/26/2017 CLINICAL DATA:  Ultrasound-guided left breast biopsy was performed today of a 2.0 cm irregular mass at 2:30 position. EXAM: DIAGNOSTIC LEFT MAMMOGRAM POST ULTRASOUND BIOPSY COMPARISON:  Previous exam(s). FINDINGS: Mammographic images were obtained following ultrasound guided biopsy of a  left breast mass. A ribbon shaped biopsy clip is satisfactorily positioned within the mass. IMPRESSION: Satisfactory position of ribbon shaped biopsy clip. Final Assessment: Post Procedure Mammograms for Marker Placement Electronically Signed   By: Curlene Dolphin M.D.   On: 09/26/2017 15:39   [place] Lt Breast Bx W Loc Dev 1st Lesion Img Bx Spec US Guide  Addendum Date: 10/03/2017   ADDENDUM REPORT: 10/03/2017 12:02 ADDENDUM: Pathology revealed GRADE I INVASIVE DUCTAL CARCINOMA of the Left breast, upper outer quadrant, 2:30 position. This was found to be concordant by Dr. Curlene Dolphin. Pathology results were discussed with the patient by telephone. The patient reported doing well after the biopsy with tenderness at the site. Post biopsy instructions and care were reviewed and questions were answered. The patient was encouraged to call [facility] for any additional concerns. Surgical consultation has been arranged with Dr. [name], per patient request, at [facility] on October 05, 2017. Dr. Harlow Asa was notified of biopsy results via EPIC message on September 27, 2017. Pathology results reported by Terie Purser, RN on 10/03/2017. Electronically Signed   By: Curlene Dolphin M.D.   On: 10/03/2017 12:02   Result Date: 10/03/2017 CLINICAL DATA:  Ultrasound-guided biopsy was recommended of a mass in the 2:30 position left breast. EXAM: ULTRASOUND GUIDED LEFT BREAST CORE NEEDLE BIOPSY COMPARISON:  Previous exam(s). FINDINGS: I met with the patient and we discussed the procedure of ultrasound-guided biopsy, including benefits and alternatives. We discussed the high likelihood of a successful procedure. We discussed the risks of the procedure, including infection, bleeding, tissue injury, clip migration, and inadequate sampling. Informed written consent was given. The usual time-out protocol was performed immediately prior to the procedure. Lesion quadrant: Upper outer quadrant  Using sterile technique and 1% Lidocaine as local anesthetic, under direct ultrasound visualization, a 12 gauge spring-loaded device was used to perform biopsy of 2 cm mass using a lateral approach. At the conclusion of the procedure a ribbon tissue marker clip was deployed into the biopsy cavity. Follow up 2 view mammogram was performed and dictated separately. IMPRESSION: Ultrasound guided biopsy of the left breast. No apparent complications. Electronically Signed: By: Curlene Dolphin M.D. On: 10/03/2017 10:15      IMPRESSION: Stage pT2, pN0, Left Breast, UOQ Invasive Ductal Carcinoma, ER (+), PR (+), HER2 (neg), Nottingham grade 2.   Patient will be a good candidate for breast conservation with radiotherapy to left breast.   Today, I talked to the patient and her daughter about the findings and work-up thus far.  We discussed the natural history of left breast cancer and general treatment, highlighting the role of radiotherapy in the management.  We discussed the available radiation  techniques, and focused on the details of logistics and delivery.  We reviewed the anticipated acute and late sequelae associated with radiation in this setting.  The patient was encouraged to ask questions that I answered to the best of my ability.  A patient consent form was discussed and signed.  We retained a copy for our records.  The patient would like to proceed with radiation and will be scheduled for CT simulation.  PLAN: Patient will meet with Dr. Lindi Adie next week, she will have Oncotype Dx testing to determine the benefit of chemotherapy. If she doesn't require chemotherapy, then we will proceed with radiation therapy, approximately 5-6 weeks post-op. Anticipate between 4 and 6.5 weeks of radiation therapy. Will consider cardiac sparring techniques and possibly hypo-fractionated treatment, but based on the patients separation, not sure if she will be a good candidate for hypo-fractionated treatments.      ------------------------------------------------  Blair Promise, PhD, MD      This document serves as a record of services personally performed by Gery Pray, MD. It was created on his behalf by [facility], a trained medical scribe. The creation of this record is based on the scribe's personal observations and the provider's statements to them. This document has been checked and approved by the attending provider.

## 2017-10-25 ENCOUNTER — Telehealth: Payer: Self-pay | Admitting: *Deleted

## 2017-10-25 ENCOUNTER — Inpatient Hospital Stay: Payer: 59 | Attending: Hematology and Oncology | Admitting: Hematology and Oncology

## 2017-10-25 DIAGNOSIS — C50412 Malignant neoplasm of upper-outer quadrant of left female breast: Secondary | ICD-10-CM | POA: Insufficient documentation

## 2017-10-25 DIAGNOSIS — Z170 Estrogen receptor positive status [ER+]: Secondary | ICD-10-CM | POA: Insufficient documentation

## 2017-10-25 NOTE — Progress Notes (Signed)
[facility] NOTE  Patient Care Team: [name], DO as PCP - General [name], MD as Consulting Physician (General Surgery)  CHIEF COMPLAINTS/PURPOSE OF CONSULTATION:  Newly diagnosed breast cancer  HISTORY OF PRESENTING ILLNESS:  Alexis [name] 62 y.o. female is here because of recent diagnosis of left breast cancer.  Patient felt a lump in the left breast and brought to the attention of her primary care physician who immediately sent her to the breast center for mammogram and ultrasound.  Subsequently she had a biopsy which confirmed invasive ductal carcinoma.  She was seen by Dr.Gerkin who performed a lumpectomy on 10/20/2017.  She is here today to discuss adjuvant treatment options.  She is healing recovering fairly well.  She does have slight swelling and discomfort in the left axilla since the surgery.  I reviewed her records extensively and collaborated the history with the patient.  SUMMARY OF ONCOLOGIC HISTORY:   Carcinoma of upper-outer quadrant of left breast in female, estrogen receptor positive ([place])   10/09/2017 Initial Diagnosis    Carcinoma of upper-outer quadrant of left breast in female, estrogen receptor positive ([place])    10/20/2017 Surgery    Left lumpectomy: IDC grade 2, 2.1 cm, margins Negative 0.1 cm to the anterior margin, UDH, 0/3 lymph nodes negative, ER 95%, PR 80%, HER-2 negative, Ki-67 10%, T2N0 stage IA    10/25/2017 Cancer Staging    Staging form: Breast, AJCC 8th Edition - Pathologic: Stage IA (pT2, pN0(sn), cM0, G2, ER+, PR+, HER2-) - Signed by Nicholas Lose, MD on 10/25/2017      MEDICAL HISTORY:  Past Medical History:  Diagnosis Date  . ASCUS of cervix with negative high risk HPV 09/2016  . Diabetes mellitus without complication (HCC)    Type 2, IDDM  . Diverticulitis   . Facial trauma    forehead,nose and upper lip secondary to fall   . Fall   . History of bronchitis   . History of kidney stones   .  History of urinary tract infection   . Hyperparathyroidism ([place])   . Parathyroid tumor   . Plantar fasciitis   . PONV (postoperative nausea and vomiting)    emotional when awakens / cries  . 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 Yeast infection    history of     SURGICAL HISTORY: Past Surgical History:  Procedure Laterality Date  . ABDOMINAL HYSTERECTOMY    . APPENDECTOMY    . CARPAL TUNNEL RELEASE     right and left   . CHOLECYSTECTOMY    . COLON SURGERY    . colonscopy     . DILATION AND CURETTAGE OF UTERUS    . HYSTEROSCOPY  2003  . LAVH  2004   leiomyoma/irregular bleeding  . PARATHYROIDECTOMY N/A 02/21/2015   Procedure: RIGHT PARATHYROIDECTOMY;  Surgeon: [name], MD;  Location: [facility];  Service: General;  Laterality: N/A;  . PARTIAL MASTECTOMY WITH AXILLARY SENTINEL LYMPH NODE BIOPSY Left 10/11/2017   Procedure: LEFT BREAST PARTIAL MASTECTOMY WITH AXILLARY SENTINEL LYMPH NODE BIOPSY;  Surgeon: [name], MD;  Location: [facility];  Service: General;  Laterality: Left;  . TUBAL LIGATION      SOCIAL HISTORY: Social History   Socioeconomic History  . Marital status: Divorced    Spouse name: Not on file  . Number of children: Not on file  . Years of education: Not on file  . Highest education level: Not on  file  Occupational History  . Not on file  Social Needs  . Financial resource strain: Not on file  . Food insecurity:    Worry: Not on file    Inability: Not on file  . Transportation needs:    Medical: Not on file    Non-medical: Not on file  Tobacco Use  . Smoking status: Never Smoker  . Smokeless tobacco: Never Used  Substance and Sexual Activity  . Alcohol use: No    Alcohol/week: 0.0 standard drinks  . Drug use: No  . Sexual activity: Not Currently    Birth control/protection: Surgical    Comment: 1st intercourse 43 yo-5 partners  Lifestyle  . Physical activity:    Days per week: Not on file     Minutes per session: Not on file  . Stress: Not on file  Relationships  . Social connections:    Talks on phone: Not on file    Gets together: Not on file    Attends religious service: Not on file    Active member of club or organization: Not on file    Attends meetings of clubs or organizations: Not on file    Relationship status: Not on file  . Intimate partner violence:    Fear of current or ex partner: Not on file    Emotionally abused: Not on file    Physically abused: Not on file    Forced sexual activity: Not on file  Other Topics Concern  . Not on file  Social History Narrative  . Not on file    FAMILY HISTORY: Family History  Problem Relation Age of Onset  . Heart attack Father   . Diabetes Brother   . Cancer Brother        Lung cancer    ALLERGIES:  is allergic to canagliflozin; empagliflozin; metformin hcl; and parlodel [bromocriptine mesylate].  MEDICATIONS:  Current Outpatient Medications  Medication Sig Dispense Refill  . ACCU-CHEK FASTCLIX LANCETS MISC USE 3 TIMES A DAY AS DIRECTED  3  . ACCU-CHEK GUIDE test strip USE 3 TIMES A DAY AS DIRECTED  3  . aspirin 81 MG tablet Take 81 mg by mouth every other day.     . Cholecalciferol (VITAMIN D PO) Take by mouth.    . insulin glargine (LANTUS) 100 UNIT/ML injection Inject 60 Units into the skin at bedtime.     . insulin lispro (HUMALOG) 100 UNIT/ML injection Inject 28 Units into the skin 3 (three) times daily before meals.     . phentermine (ADIPEX-P) 37.5 MG tablet Take 37.5 mg by mouth daily.  3  . UNIFINE PENTIPS 31G X 5 MM MISC USE TWICE A DAY AS DIRECTED  2   No current facility-administered medications for this visit.     REVIEW OF SYSTEMS:   Constitutional: Denies fevers, chills or abnormal night sweats Eyes: Denies blurriness of vision, double vision or watery eyes Ears, nose, mouth, throat, and face: Denies mucositis or sore throat Respiratory: Denies cough, dyspnea or wheezes Cardiovascular:  Denies palpitation, chest discomfort or lower extremity swelling Gastrointestinal:  Denies nausea, heartburn or change in bowel habits Skin: Denies abnormal skin rashes Lymphatics: Denies new lymphadenopathy or easy bruising Neurological:Denies numbness, tingling or new weaknesses Behavioral/Psych: Mood is stable, no new changes  Breast: Recent left lumpectomy with slight discomfort in the left axilla along with swelling All other systems were reviewed with the patient and are negative.  PHYSICAL EXAMINATION: ECOG PERFORMANCE STATUS: 1 - Symptomatic but completely  ambulatory  Vitals:   10/25/17 1241  BP: (!) 138/56  Pulse: 68  Resp: 18  Temp: 98.2 F (36.8 C)  SpO2: 97%   Filed Weights   10/25/17 1241  Weight: 205 lb 6.4 oz (93.2 kg)    GENERAL:alert, no distress and comfortable SKIN: skin color, texture, turgor are normal, no rashes or significant lesions EYES: normal, conjunctiva are pink and non-injected, sclera clear OROPHARYNX:no exudate, no erythema and lips, buccal mucosa, and tongue normal  NECK: supple, thyroid normal size, non-tender, without nodularity LYMPH:  no palpable lymphadenopathy in the cervical, axillary or inguinal LUNGS: clear to auscultation and percussion with normal breathing effort HEART: regular rate & rhythm and no murmurs and no lower extremity edema ABDOMEN:abdomen soft, non-tender and normal bowel sounds Musculoskeletal:no cyanosis of digits and no clubbing  PSYCH: alert & oriented x 3 with fluent speech NEURO: no focal motor/sensory deficits  LABORATORY DATA:  I have reviewed the data as listed Lab Results  Component Value Date   WBC 17.2 (H) 12/06/2016   HGB 16.5 (H) 12/06/2016   HCT 51.6 (H) 12/06/2016   MCV 87.6 12/06/2016   PLT 330 12/06/2016   Lab Results  Component Value Date   NA 138 10/07/2017   K 4.2 10/07/2017   CL 106 10/07/2017   CO2 24 10/07/2017    RADIOGRAPHIC STUDIES: I have personally reviewed the radiological  reports and agreed with the findings in the report.  ASSESSMENT AND PLAN:  Carcinoma of upper-outer quadrant of left breast in female, estrogen receptor positive ([place]) 10/11/2017:Left lumpectomy: IDC grade 2, 2.1 cm, margins Negative 0.1 cm to the anterior margin, UDH, 0/3 lymph nodes negative, ER 95%, PR 80%, HER-2 negative, Ki-67 10%, T2N0 stage IA  Pathology and radiology counseling:Discussed with the patient, the details of pathology including the type of breast cancer,the clinical staging, the significance of ER, PR and HER-2/neu receptors and the implications for treatment. After reviewing the pathology in detail, we proceeded to discuss the different treatment options between surgery, radiation, chemotherapy, antiestrogen therapies.  Recommendations: 1. Oncotype DX testing to determine if chemotherapy would be of any benefit followed by 2. Adjuvant radiation therapy followed by 3. Adjuvant antiestrogen therapy  Oncotype counseling: I discussed Oncotype DX test. I explained to the patient that this is a 21 gene panel to evaluate patient tumors DNA to calculate recurrence score. This would help determine whether patient has high risk or intermediate risk or low risk breast cancer. She understands that if her tumor was found to be high risk, she would benefit from systemic chemotherapy. If low risk, no need of chemotherapy. If she was found to be intermediate risk, we would need to evaluate the score as well as other risk factors and determine if an abbreviated chemotherapy may be of benefit.  Return to clinic based on Oncotype DX test result   All questions were answered. The patient knows to call the clinic with any problems, questions or concerns.    Harriette Ohara, MD 10/25/17

## 2017-10-25 NOTE — Telephone Encounter (Signed)
Received order for oncotype testing. Requisition faxed to pathology. Received by Keisha 

## 2017-10-25 NOTE — Assessment & Plan Note (Signed)
10/11/2017:Left lumpectomy: IDC grade 2, 2.1 cm, margins Negative 0.1 cm to the anterior margin, UDH, 0/3 lymph nodes negative, ER 95%, PR 80%, HER-2 negative, Ki-67 10%, T2N0 stage IA  Pathology and radiology counseling:Discussed with the patient, the details of pathology including the type of breast cancer,the clinical staging, the significance of ER, PR and HER-2/neu receptors and the implications for treatment. After reviewing the pathology in detail, we proceeded to discuss the different treatment options between surgery, radiation, chemotherapy, antiestrogen therapies.  Recommendations: 1. Oncotype DX testing to determine if chemotherapy would be of any benefit followed by 2. Adjuvant radiation therapy followed by 3. Adjuvant antiestrogen therapy  Oncotype counseling: I discussed Oncotype DX test. I explained to the patient that this is a 21 gene panel to evaluate patient tumors DNA to calculate recurrence score. This would help determine whether patient has high risk or intermediate risk or low risk breast cancer. She understands that if her tumor was found to be high risk, she would benefit from systemic chemotherapy. If low risk, no need of chemotherapy. If she was found to be intermediate risk, we would need to evaluate the score as well as other risk factors and determine if an abbreviated chemotherapy may be of benefit.  Return to clinic based on Oncotype DX test result

## 2017-10-27 ENCOUNTER — Ambulatory Visit: Payer: 59 | Admitting: Podiatry

## 2017-10-28 ENCOUNTER — Telehealth: Payer: Self-pay | Admitting: Hematology and Oncology

## 2017-10-28 NOTE — Telephone Encounter (Signed)
Printed medical records for [facility], Release ID: [id number]

## 2017-10-31 MED FILL — HUMALOG 100 UNITS/ML KWIKPE: 100 | 90 days supply | Qty: 75 | Fill #1

## 2017-10-31 MED FILL — PHENTERMINE 37.5 MG TABLET: 37.5 | 30 days supply | Qty: 30 | Fill #1

## 2017-11-02 ENCOUNTER — Telehealth: Payer: Self-pay | Admitting: *Deleted

## 2017-11-02 DIAGNOSIS — C50412 Malignant neoplasm of upper-outer quadrant of left female breast: Secondary | ICD-10-CM | POA: Diagnosis not present

## 2017-11-02 DIAGNOSIS — Z170 Estrogen receptor positive status [ER+]: Secondary | ICD-10-CM | POA: Diagnosis not present

## 2017-11-02 NOTE — Progress Notes (Signed)
FMLA successfully faxed to [employer] at [phone]. Mailed copy to patient address on file. 

## 2017-11-02 NOTE — Telephone Encounter (Signed)
Received oncotype score of 23/9%. Physician team notified. Called pt with results and discussed chemo is not recommended and next step is xrt with Dr. Sondra Come. Received verbal understanding.

## 2017-11-03 ENCOUNTER — Encounter: Payer: Self-pay | Admitting: Radiation Oncology

## 2017-11-04 ENCOUNTER — Encounter: Payer: Self-pay | Admitting: Hematology and Oncology

## 2017-11-04 ENCOUNTER — Ambulatory Visit: Payer: 59 | Admitting: Gynecology

## 2017-11-04 ENCOUNTER — Encounter: Payer: Self-pay | Admitting: Gynecology

## 2017-11-04 VITALS — BP 122/76

## 2017-11-04 DIAGNOSIS — N890 Mild vaginal dysplasia: Secondary | ICD-10-CM

## 2017-11-04 NOTE — Progress Notes (Signed)
    Alexis [name] 06/22/1955 [id number]        62 y.o.  [id number] presents for colposcopy.  History VAIN 1 2014 on colposcopic biopsy.  Pap smear/HPV 2016 negative.  Pap smear 2017 negative.  Pap smear last year showed ASCUS negative high risk HPV.  Most recent Pap smear shows VAIN 1   Past medical history,surgical history, problem list, medications, allergies, family history and social history were all reviewed and documented in the EPIC chart.  Directed ROS with pertinent positives and negatives documented in the history of present illness/assessment and plan.  Exam: [name] assistant Vitals:   11/04/17 1153  BP: 122/76   General appearance:  Normal Abdomen soft nontender without gross masses Pelvic external BUS vagina with atrophic changes.  Bimanual without gross masses or tenderness  Colposcopy performed after acetic acid cleanse from the vaginal cuff to the introital opening with no abnormalities noted.  Assessment/Plan:  62 y.o. [id number] with persistent low-grade atypia.  Colposcopy was negative.  Recommend follow-up Pap smear next year at her annual exam.  As long as remains low-grade then will follow expectantly.  I reviewed with the patient this may be HPV related but not in the 14 subtypes that are screened in the high risk group.  Need to continue to follow stressed.    [name] MD, 12:25 PM 11/04/2017

## 2017-11-04 NOTE — Patient Instructions (Signed)
Follow-up for annual exam and we will plan on repeating her Pap smear in 1 year

## 2017-11-07 ENCOUNTER — Encounter (HOSPITAL_COMMUNITY): Payer: Self-pay | Admitting: Hematology and Oncology

## 2017-11-07 DIAGNOSIS — E785 Hyperlipidemia, unspecified: Secondary | ICD-10-CM | POA: Diagnosis not present

## 2017-11-07 DIAGNOSIS — R946 Abnormal results of thyroid function studies: Secondary | ICD-10-CM | POA: Diagnosis not present

## 2017-11-07 DIAGNOSIS — E538 Deficiency of other specified B group vitamins: Secondary | ICD-10-CM | POA: Diagnosis not present

## 2017-11-07 DIAGNOSIS — Z8639 Personal history of other endocrine, nutritional and metabolic disease: Secondary | ICD-10-CM | POA: Diagnosis not present

## 2017-11-07 DIAGNOSIS — E1165 Type 2 diabetes mellitus with hyperglycemia: Secondary | ICD-10-CM | POA: Diagnosis not present

## 2017-11-07 NOTE — Progress Notes (Signed)
Updated FMLA paperwork for patient to be out continuously from 11/09/17 until 01/19/18. Successfully faxed to [employer] at [phone].

## 2017-11-08 NOTE — Progress Notes (Signed)
Location of Breast Cancer: left breast tail of [place]  Histology per Pathology Report: 10/11/17:  Diagnosis 1. Breast, lumpectomy, Left - INVASIVE DUCTAL CARCINOMA, NOTTINGHAM GRADE 2 OF 3, 2.1 CM - MARGINS UNINVOLVED BY CARCINOMA (0.1 CM; ANTERIOR MARGIN) - USUAL DUCTAL HYPERPLASIA, FIBROCYSTIC CHANGES AND DUCT ECTASIA - PREVIOUS BIOPSY SITE CHANGES PRESENT - SEE ONCOLOGY TABLE BELOW 2. Lymph node, sentinel, biopsy, Left axilla - NO CARCINOMA IDENTIFIED IN ONE LYMPH NODE (0/1) 3. Lymph node, sentinel, biopsy, Left axilla - NO CARCINOMA IDENTIFIED IN ONE LYMPH NODE (0/1) 4. Lymph node, sentinel, biopsy, Left axilla - NO CARCINOMA IDENTIFIED IN ONE LYMPH NODE (0/1)  Receptor Status: ER(95%), PR (80%), Her2-neu (negative), Ki-(10%)  Did patient present with symptoms (if so, please note symptoms) or was this found on screening mammography?: per 09/16/17: Palpable mass left breast tail of Spence. Last mammogram 2016. Will schedule diagnostic mammography and ultrasound. Differential reviewed with the patient and importance of follow-up stressed.  Past/Anticipated interventions by surgeon, if any: 10/11/17: PROCEDURE: Left partial mastectomy with axillary sentinel lymph node biopsy.  SURGEON: Earnstine Regal, MD  Past/Anticipated interventions by medical oncology, if any: Chemotherapy 10/25/17 per Dr. Lindi Adie: Recommendations: 1. Oncotype DX testing to determine if chemotherapy would be of any benefit followed by 2. Adjuvant radiation therapy followed by 3. Adjuvant antiestrogen therapy  Oncotype counseling: I discussed Oncotype DX test. I explained to the patient that this is a 21 gene panel to evaluate patient tumors DNA to calculate recurrence score. This would help determine whether patient has high risk or intermediate risk or low risk breast cancer. She understands that if her tumor was found to be high risk, she would benefit from systemic chemotherapy. If low risk, no need of  chemotherapy. If she was found to be intermediate risk, we would need to evaluate the score as well as other risk factors and determine if an abbreviated chemotherapy may be of benefit.  Received oncotype score of 23/9%. Physician team notified. Called pt with results and discussed chemo is not recommended and next step is xrt with Dr. Sondra Come. Received verbal understanding.   Lymphedema issues, if any:  Pt denies   Pain issues, if any: Pt denies c/o pain in breast and reports that Tylenol OTC relieves rare shooting pains.  SAFETY ISSUES:  Prior radiation? No  Pacemaker/ICD? No  Possible current pregnancy? No  Is the patient on methotrexate? No  Current Complaints / other details: Pt presents today for follow up new appointment with Dr. Sondra Come for Radiation Oncology. Pt is unaccompanied. Loma Sousa, RN BSN

## 2017-11-10 ENCOUNTER — Other Ambulatory Visit: Payer: Self-pay | Admitting: Physician Assistant

## 2017-11-10 ENCOUNTER — Ambulatory Visit
Admission: RE | Admit: 2017-11-10 | Discharge: 2017-11-10 | Disposition: A | Discharge status: Home / Self Care | Payer: 59 | Source: Ambulatory Visit | Attending: Radiation Oncology | Admitting: Radiation Oncology

## 2017-11-10 ENCOUNTER — Encounter: Payer: Self-pay | Admitting: Radiation Oncology

## 2017-11-10 ENCOUNTER — Other Ambulatory Visit: Payer: Self-pay

## 2017-11-10 VITALS — BP 156/61 | HR 77 | Temp 98.2°F | Resp 18 | Ht 64.0 in | Wt 208.8 lb

## 2017-11-10 VITALS — BP 156/61 | HR 77 | Temp 98.2°F | Resp 18 | Ht 64.5 in | Wt 208.5 lb

## 2017-11-10 DIAGNOSIS — Z794 Long term (current) use of insulin: Secondary | ICD-10-CM | POA: Diagnosis not present

## 2017-11-10 DIAGNOSIS — C50412 Malignant neoplasm of upper-outer quadrant of left female breast: Secondary | ICD-10-CM

## 2017-11-10 DIAGNOSIS — Z9889 Other specified postprocedural states: Secondary | ICD-10-CM | POA: Diagnosis not present

## 2017-11-10 DIAGNOSIS — Z79899 Other long term (current) drug therapy: Secondary | ICD-10-CM | POA: Insufficient documentation

## 2017-11-10 DIAGNOSIS — Z170 Estrogen receptor positive status [ER+]: Secondary | ICD-10-CM

## 2017-11-10 DIAGNOSIS — Z7982 Long term (current) use of aspirin: Secondary | ICD-10-CM | POA: Insufficient documentation

## 2017-11-10 DIAGNOSIS — N6459 Other signs and symptoms in breast: Secondary | ICD-10-CM | POA: Diagnosis not present

## 2017-11-10 DIAGNOSIS — D485 Neoplasm of uncertain behavior of skin: Secondary | ICD-10-CM | POA: Diagnosis not present

## 2017-11-10 MED FILL — KETOCONAZOLE 2% SHAMPOO: 2 | 30 days supply | Qty: 120 | Fill #0

## 2017-11-10 NOTE — Progress Notes (Signed)
FMLA successfully faxed to [employer] at [phone]. Mailed copy to patient address on file. 

## 2017-11-10 NOTE — Progress Notes (Signed)
Radiation Oncology         [phone]) [phone] ________________________________  Name: Alexis [name] MRN: [id number]  Date: 11/10/2017  DOB: [birth year]  Re-Evaluation Visit Note  CC: Alexis Graves, DO  Alexis Gemma, MD    ICD-10-CM   1. Carcinoma of upper-outer quadrant of left breast in female, estrogen receptor positive ([place]) C50.412    Z17.0     Diagnosis:   62 y.o. female with Stage pT2, pN0, Left Breast, UOQ Invasive Ductal Carcinoma, ER (+), PR (+), HER2 (neg), Nottingham grade 2  Narrative:  The patient returns today for follow-up to further discuss the role of adjuvant radiotherapy in the management of her breast cancer.  Her Oncotype DX testing came back low-risk, indicating no need for chemotherapy.   On review of systems, the patient reports rare shooting pain in her breast that is relieved with Tylenol. She denies any swelling in her arm but has noticed some swelling in the left axillary region. She denies any fever or chills. She states that she saw Alexis Askew, PA-C, in dermatology today.                   ALLERGIES:  is allergic to canagliflozin; empagliflozin; metformin hcl; and parlodel [bromocriptine mesylate].  Meds: Current Outpatient Medications  Medication Sig Dispense Refill  . ACCU-CHEK FASTCLIX LANCETS MISC USE 3 TIMES A DAY AS DIRECTED  3  . ACCU-CHEK GUIDE test strip USE 3 TIMES A DAY AS DIRECTED  3  . aspirin 81 MG tablet Take 81 mg by mouth every other day.     . Cholecalciferol (VITAMIN D PO) Take by mouth.    Marland Kitchen HYDROcodone-acetaminophen (NORCO) 7.5-325 MG tablet     . insulin glargine (LANTUS) 100 UNIT/ML injection Inject 60 Units into the skin at bedtime.     . insulin lispro (HUMALOG) 100 UNIT/ML injection Inject 28 Units into the skin 3 (three) times daily before meals.     . phentermine (ADIPEX-P) 37.5 MG tablet Take 37.5 mg by mouth daily.  3  . UNIFINE PENTIPS 31G X 5 MM MISC USE TWICE A DAY AS DIRECTED  2   No current  facility-administered medications for this encounter.     Physical Findings: The patient is in no acute distress. Patient is alert and oriented.  height is '5\' 4"'$  (1.626 m) and weight is 208 lb 12.8 oz (94.7 kg). Her oral temperature is 98.2 F (36.8 C). Her blood pressure is 156/61 (abnormal) and her pulse is 77. Her respiration is 18 and oxygen saturation is 95%.   Lungs are clear to auscultation bilaterally. Heart has regular rate and rhythm. No palpable cervical, supraclavicular, or axillary adenopathy. Abdomen soft, non-tender, normal bowel sounds.  Left Breast: lumpectomy scar well healed. No signs of infection within the breast and some swelling noted near the lumpectomy scar consistent with possible seroma   Lab Findings: Lab Results  Component Value Date   WBC 17.2 (H) 12/06/2016   HGB 16.5 (H) 12/06/2016   HCT 51.6 (H) 12/06/2016   MCV 87.6 12/06/2016   PLT 330 12/06/2016    Radiographic Findings: No results found.  Impression:  Stage pT2, pN0, Left Breast, UOQ Invasive Ductal Carcinoma, ER (+), PR (+), HER2 (neg), Nottingham grade 2, s/p partial mastectomy. Patient is now ready to proceed with adjuvant radiation therapy as part of her overall management. As above she will not require chemotherapy in her management.  Today, I talked to the patient about the findings and work-up  thus far.  We discussed the natural history of breast cancer and general treatment, highlighting the role of adjuvant radiotherapy in the management.  We discussed the available radiation techniques, and focused on the details of logistics and delivery.  We reviewed the anticipated acute and late sequelae associated with radiation in this setting.  The patient was encouraged to ask questions that I answered to the best of my ability.  A patient consent form was discussed and signed.  We retained a copy for our records.  The patient would like to proceed with radiation.  Plan:  CT simulation today with  treatment to begin in approximately 1 week.  Addendum: The patient's treatment planning CT scan this afternoon showed significant fluid in the area of her lumpectomy cavity. Measured approximately 373 mL. I discussed these findings with Dr. Armandina [name]  her surgeon and he recommends drainage of this fluid. Patient does have some discomfort in this region but no actual pain. She will meet with Dr. Harlow [name] early next week and the patient will be rescheduled after her fluid aspiration with repeat treatment planning CT scan as her contour would change, treatments to begin in approximately 2 weeks.  ____________________________________  Alexis Promise, PhD, MD  This document serves as a record of services personally performed by Alexis Pray, MD. It was created on his behalf by Alexis [name], a trained medical scribe. The creation of this record is based on the scribe's personal observations and the provider's statements to them. This document has been checked and approved by the attending provider.

## 2017-11-10 NOTE — Progress Notes (Signed)
  Radiation Oncology         [phone] ________________________________  Name: Alexis [name] MRN: [id number]  Date: 11/10/2017  DOB: 11-02-1955  SIMULATION AND TREATMENT PLANNING NOTE    ICD-10-CM   1. Carcinoma of upper-outer quadrant of left breast in female, estrogen receptor positive ([place]) C50.412    Z17.0     DIAGNOSIS:  62 y.o. female with Stage pT2, pN0, Left Breast, UOQ Invasive Ductal Carcinoma, ER (+), PR (+), HER2 (neg), Nottingham grade 2  NARRATIVE:  The patient was brought to the [facility].  Identity was confirmed.  All relevant records and images related to the planned course of therapy were reviewed.  The patient freely provided informed written consent to proceed with treatment after reviewing the details related to the planned course of therapy. The consent form was witnessed and verified by the simulation staff.  Then, the patient was set-up in a stable reproducible  supine position for radiation therapy.  CT images were obtained.  Surface markings were placed.  The CT images were loaded into the planning software.  Then the target and avoidance structures were contoured.  Treatment planning then occurred.  The radiation prescription was entered and confirmed.  Then, I designed and supervised the construction of a total of 3 medically necessary complex treatment devices.  I have requested : 3D Simulation  I have requested a DVH of the following structures: heart, lungs, lumpectomy cavity.  I have ordered:CBC  PLAN:  The patient will receive 50.4 Gy in 28 fractions followed by a boost to the lumpectomy cavity of 12 gray in 6 fractions.  Addendum: The patient's treatment planning CT scan this afternoon showed significant fluid in the area of her lumpectomy cavity. Measured approximately 373 mL. I discussed these findings with Dr. [name]  her surgeon and he recommends drainage of this fluid. Patient does have some discomfort in this region but no  actual pain. She will meet with Dr. Harlow Asa early next week and the patient will be rescheduled after her fluid aspiration with repeat treatment planning CT scan as her contour would change, treatments to begin in approximately 2 weeks.    -----------------------------------  Blair Promise, PhD, MD

## 2017-11-14 ENCOUNTER — Telehealth: Payer: Self-pay | Admitting: Hematology and Oncology

## 2017-11-14 NOTE — Progress Notes (Signed)
Cancer Claim Form successfully faxed to [employer] at [phone]. Mailed copy to patient address on file.

## 2017-11-14 NOTE — Telephone Encounter (Signed)
Scheduled appt per 11/4 sch message - sent reminder letter in the mail with appt date and time

## 2017-11-17 ENCOUNTER — Ambulatory Visit: Payer: 59 | Admitting: Radiation Oncology

## 2017-11-21 ENCOUNTER — Ambulatory Visit: Payer: 59 | Admitting: Radiation Oncology

## 2017-11-22 ENCOUNTER — Ambulatory Visit: Payer: 59

## 2017-11-22 ENCOUNTER — Ambulatory Visit
Admission: RE | Admit: 2017-11-22 | Discharge: 2017-11-22 | Disposition: A | Discharge status: Home / Self Care | Payer: 59 | Source: Ambulatory Visit | Attending: Radiation Oncology | Admitting: Radiation Oncology

## 2017-11-22 DIAGNOSIS — C50412 Malignant neoplasm of upper-outer quadrant of left female breast: Secondary | ICD-10-CM | POA: Diagnosis not present

## 2017-11-22 DIAGNOSIS — Z170 Estrogen receptor positive status [ER+]: Secondary | ICD-10-CM | POA: Diagnosis not present

## 2017-11-23 ENCOUNTER — Ambulatory Visit: Payer: 59

## 2017-11-24 ENCOUNTER — Ambulatory Visit: Payer: 59

## 2017-11-24 DIAGNOSIS — C50412 Malignant neoplasm of upper-outer quadrant of left female breast: Secondary | ICD-10-CM | POA: Diagnosis not present

## 2017-11-24 DIAGNOSIS — E782 Mixed hyperlipidemia: Secondary | ICD-10-CM | POA: Diagnosis not present

## 2017-11-24 DIAGNOSIS — Z170 Estrogen receptor positive status [ER+]: Secondary | ICD-10-CM | POA: Diagnosis not present

## 2017-11-24 DIAGNOSIS — G894 Chronic pain syndrome: Secondary | ICD-10-CM | POA: Diagnosis not present

## 2017-11-24 DIAGNOSIS — K219 Gastro-esophageal reflux disease without esophagitis: Secondary | ICD-10-CM | POA: Diagnosis not present

## 2017-11-24 DIAGNOSIS — E109 Type 1 diabetes mellitus without complications: Secondary | ICD-10-CM | POA: Diagnosis not present

## 2017-11-24 DIAGNOSIS — E039 Hypothyroidism, unspecified: Secondary | ICD-10-CM | POA: Diagnosis not present

## 2017-11-24 DIAGNOSIS — C50919 Malignant neoplasm of unspecified site of unspecified female breast: Secondary | ICD-10-CM | POA: Diagnosis not present

## 2017-11-24 DIAGNOSIS — E538 Deficiency of other specified B group vitamins: Secondary | ICD-10-CM | POA: Diagnosis not present

## 2017-11-24 NOTE — Progress Notes (Signed)
  Radiation Oncology         [phone] ________________________________  Name: CILICIA BORDEN MRN: [id number]  Date: 11/22/2017  DOB: 1955-03-11  SIMULATION AND TREATMENT PLANNING NOTE    ICD-10-CM   1. Carcinoma of upper-outer quadrant of left breast in female, estrogen receptor positive ([place]) C50.412    Z17.0     DIAGNOSIS:  62 y.o.female withStage pT2, pN0, Left Breast, UOQ Invasive Ductal Carcinoma, ER (+), PR (+), HER2 (neg), Nottingham grade 2  NARRATIVE:  The patient was brought to the [facility].  Identity was confirmed.  All relevant records and images related to the planned course of therapy were reviewed.  The patient freely provided informed written consent to proceed with treatment after reviewing the details related to the planned course of therapy. The consent form was witnessed and verified by the simulation staff.  Then, the patient was set-up in a stable reproducible  supine position for radiation therapy.  CT images were obtained.  Surface markings were placed.  The CT images were loaded into the planning software.  Then the target and avoidance structures were contoured.  Treatment planning then occurred.  The radiation prescription was entered and confirmed.  Then, I designed and supervised the construction of a total of 3 medically necessary complex treatment devices.  I have requested : 3D Simulation  I have requested a DVH of the following structures: heart, lungs, lumpectomy cavity.  I have ordered:CBC  PLAN:  The patient will receive 50.4 Gy in 28 fractions followed by a boost to the lumpectomy cavity of 12 gray in 6 fractions. Today was re- simulation She had fluid drained from the lumpectomy cavity. The lumpectomy cavity was  smaller in size, the patient was more comfortable   Optical Surface Tracking Plan:  Since intensity modulated radiotherapy (IMRT) and 3D conformal radiation treatment methods are predicated on accurate and precise  positioning for treatment, intrafraction motion monitoring is medically necessary to ensure accurate and safe treatment delivery.  The ability to quantify intrafraction motion without excessive ionizing radiation dose can only be performed with optical surface tracking. Accordingly, surface imaging offers the opportunity to obtain 3D measurements of patient position throughout IMRT and 3D treatments without excessive radiation exposure.  I am ordering optical surface tracking for this patient's upcoming course of radiotherapy. ________________________________    Blair Promise, PhD, MD

## 2017-11-25 ENCOUNTER — Ambulatory Visit: Payer: 59

## 2017-11-28 ENCOUNTER — Ambulatory Visit: Payer: 59

## 2017-11-28 ENCOUNTER — Ambulatory Visit
Admission: RE | Admit: 2017-11-28 | Discharge: 2017-11-28 | Disposition: A | Discharge status: Home / Self Care | Payer: 59 | Source: Ambulatory Visit | Attending: Radiation Oncology | Admitting: Radiation Oncology

## 2017-11-28 DIAGNOSIS — Z170 Estrogen receptor positive status [ER+]: Secondary | ICD-10-CM | POA: Diagnosis not present

## 2017-11-28 DIAGNOSIS — C50412 Malignant neoplasm of upper-outer quadrant of left female breast: Secondary | ICD-10-CM | POA: Diagnosis not present

## 2017-11-28 MED FILL — PHENTERMINE 37.5 MG TABLET: 37.5 | 30 days supply | Qty: 30 | Fill #2

## 2017-11-29 ENCOUNTER — Ambulatory Visit: Payer: 59

## 2017-11-29 ENCOUNTER — Ambulatory Visit
Admission: RE | Admit: 2017-11-29 | Discharge: 2017-11-29 | Disposition: A | Discharge status: Home / Self Care | Payer: 59 | Source: Ambulatory Visit | Attending: Radiation Oncology | Admitting: Radiation Oncology

## 2017-11-29 DIAGNOSIS — C50412 Malignant neoplasm of upper-outer quadrant of left female breast: Secondary | ICD-10-CM | POA: Diagnosis not present

## 2017-11-29 DIAGNOSIS — Z170 Estrogen receptor positive status [ER+]: Secondary | ICD-10-CM

## 2017-11-29 MED ORDER — RADIAPLEXRX EX GEL
Freq: Once | CUTANEOUS | Status: AC
  Administered 2017-11-29: 18:00:00 via TOPICAL

## 2017-11-29 MED ORDER — ALRA NON-METALLIC DEODORANT (RAD-ONC)
1.0000 "application " | Freq: Once | TOPICAL | Status: AC
  Administered 2017-11-29: 1 via TOPICAL

## 2017-11-29 NOTE — Progress Notes (Signed)
Pt here for patient teaching.  Pt given Radiation and You booklet, skin care instructions, Alra deodorant and Radiaplex gel.  Reviewed areas of pertinence such as fatigue, skin changes, breast tenderness and breast swelling . Pt able to give teach back of to pat skin and use unscented/gentle soap,apply Radiaplex bid, avoid applying anything to skin within 4 hours of treatment, avoid wearing an under wire bra and to use an electric razor if they must shave. Pt demonstrated understanding and verbalizes understanding of information given and will contact nursing with any questions or concerns.     Http://rtanswers.org/treatmentinformation/whattoexpect/index  Loma Sousa, RN BSN

## 2017-11-30 ENCOUNTER — Ambulatory Visit
Admission: RE | Admit: 2017-11-30 | Discharge: 2017-11-30 | Disposition: A | Discharge status: Home / Self Care | Payer: 59 | Source: Ambulatory Visit | Attending: Radiation Oncology | Admitting: Radiation Oncology

## 2017-11-30 ENCOUNTER — Ambulatory Visit: Payer: 59

## 2017-11-30 DIAGNOSIS — C50412 Malignant neoplasm of upper-outer quadrant of left female breast: Secondary | ICD-10-CM | POA: Diagnosis not present

## 2017-11-30 DIAGNOSIS — Z170 Estrogen receptor positive status [ER+]: Secondary | ICD-10-CM | POA: Diagnosis not present

## 2017-12-01 ENCOUNTER — Ambulatory Visit
Admission: RE | Admit: 2017-12-01 | Discharge: 2017-12-01 | Disposition: A | Discharge status: Home / Self Care | Payer: 59 | Source: Ambulatory Visit | Attending: Radiation Oncology | Admitting: Radiation Oncology

## 2017-12-01 ENCOUNTER — Ambulatory Visit: Payer: 59

## 2017-12-01 DIAGNOSIS — C50412 Malignant neoplasm of upper-outer quadrant of left female breast: Secondary | ICD-10-CM | POA: Diagnosis not present

## 2017-12-01 DIAGNOSIS — Z170 Estrogen receptor positive status [ER+]: Secondary | ICD-10-CM | POA: Diagnosis not present

## 2017-12-02 ENCOUNTER — Ambulatory Visit: Payer: 59

## 2017-12-02 ENCOUNTER — Ambulatory Visit
Admission: RE | Admit: 2017-12-02 | Discharge: 2017-12-02 | Disposition: A | Discharge status: Home / Self Care | Payer: 59 | Source: Ambulatory Visit | Attending: Radiation Oncology | Admitting: Radiation Oncology

## 2017-12-02 DIAGNOSIS — Z170 Estrogen receptor positive status [ER+]: Secondary | ICD-10-CM | POA: Diagnosis not present

## 2017-12-02 DIAGNOSIS — C50412 Malignant neoplasm of upper-outer quadrant of left female breast: Secondary | ICD-10-CM | POA: Diagnosis not present

## 2017-12-04 ENCOUNTER — Ambulatory Visit
Admission: RE | Admit: 2017-12-04 | Discharge: 2017-12-04 | Disposition: A | Discharge status: Home / Self Care | Payer: 59 | Source: Ambulatory Visit | Attending: Radiation Oncology | Admitting: Radiation Oncology

## 2017-12-04 DIAGNOSIS — Z170 Estrogen receptor positive status [ER+]: Secondary | ICD-10-CM | POA: Diagnosis not present

## 2017-12-04 DIAGNOSIS — C50412 Malignant neoplasm of upper-outer quadrant of left female breast: Secondary | ICD-10-CM | POA: Diagnosis not present

## 2017-12-05 ENCOUNTER — Ambulatory Visit
Admission: RE | Admit: 2017-12-05 | Discharge: 2017-12-05 | Disposition: A | Discharge status: Home / Self Care | Payer: 59 | Source: Ambulatory Visit | Attending: Radiation Oncology | Admitting: Radiation Oncology

## 2017-12-05 ENCOUNTER — Ambulatory Visit: Payer: 59

## 2017-12-05 DIAGNOSIS — C50412 Malignant neoplasm of upper-outer quadrant of left female breast: Secondary | ICD-10-CM | POA: Diagnosis not present

## 2017-12-05 DIAGNOSIS — Z170 Estrogen receptor positive status [ER+]: Secondary | ICD-10-CM | POA: Diagnosis not present

## 2017-12-06 ENCOUNTER — Ambulatory Visit
Admission: RE | Admit: 2017-12-06 | Discharge: 2017-12-06 | Disposition: A | Discharge status: Home / Self Care | Payer: 59 | Source: Ambulatory Visit | Attending: Radiation Oncology | Admitting: Radiation Oncology

## 2017-12-06 ENCOUNTER — Ambulatory Visit: Payer: 59

## 2017-12-06 DIAGNOSIS — Z170 Estrogen receptor positive status [ER+]: Secondary | ICD-10-CM | POA: Diagnosis not present

## 2017-12-06 DIAGNOSIS — C50412 Malignant neoplasm of upper-outer quadrant of left female breast: Secondary | ICD-10-CM | POA: Diagnosis not present

## 2017-12-07 ENCOUNTER — Ambulatory Visit
Admission: RE | Admit: 2017-12-07 | Discharge: 2017-12-07 | Disposition: A | Discharge status: Home / Self Care | Payer: 59 | Source: Ambulatory Visit | Attending: Radiation Oncology | Admitting: Radiation Oncology

## 2017-12-07 ENCOUNTER — Ambulatory Visit: Payer: 59

## 2017-12-07 DIAGNOSIS — C50412 Malignant neoplasm of upper-outer quadrant of left female breast: Secondary | ICD-10-CM | POA: Diagnosis not present

## 2017-12-07 DIAGNOSIS — Z170 Estrogen receptor positive status [ER+]: Secondary | ICD-10-CM | POA: Diagnosis not present

## 2017-12-12 ENCOUNTER — Ambulatory Visit
Admission: RE | Admit: 2017-12-12 | Discharge: 2017-12-12 | Disposition: A | Discharge status: Home / Self Care | Payer: 59 | Source: Ambulatory Visit | Attending: Radiation Oncology | Admitting: Radiation Oncology

## 2017-12-12 ENCOUNTER — Ambulatory Visit: Payer: 59

## 2017-12-12 DIAGNOSIS — Z170 Estrogen receptor positive status [ER+]: Secondary | ICD-10-CM | POA: Diagnosis not present

## 2017-12-12 DIAGNOSIS — C50412 Malignant neoplasm of upper-outer quadrant of left female breast: Secondary | ICD-10-CM | POA: Diagnosis not present

## 2017-12-13 ENCOUNTER — Ambulatory Visit
Admission: RE | Admit: 2017-12-13 | Discharge: 2017-12-13 | Disposition: A | Discharge status: Home / Self Care | Payer: 59 | Source: Ambulatory Visit | Attending: Radiation Oncology | Admitting: Radiation Oncology

## 2017-12-13 ENCOUNTER — Ambulatory Visit: Payer: 59

## 2017-12-13 DIAGNOSIS — Z170 Estrogen receptor positive status [ER+]: Secondary | ICD-10-CM

## 2017-12-13 DIAGNOSIS — C50412 Malignant neoplasm of upper-outer quadrant of left female breast: Secondary | ICD-10-CM | POA: Diagnosis not present

## 2017-12-13 MED ORDER — SONAFINE EX EMUL
1.0000 "application " | Freq: Two times a day (BID) | CUTANEOUS | Status: DC
  Administered 2017-12-13: 1 via TOPICAL

## 2017-12-14 ENCOUNTER — Ambulatory Visit: Payer: 59

## 2017-12-14 ENCOUNTER — Ambulatory Visit
Admission: RE | Admit: 2017-12-14 | Discharge: 2017-12-14 | Disposition: A | Discharge status: Home / Self Care | Payer: 59 | Source: Ambulatory Visit | Attending: Radiation Oncology | Admitting: Radiation Oncology

## 2017-12-14 DIAGNOSIS — Z170 Estrogen receptor positive status [ER+]: Secondary | ICD-10-CM | POA: Diagnosis not present

## 2017-12-14 DIAGNOSIS — C50412 Malignant neoplasm of upper-outer quadrant of left female breast: Secondary | ICD-10-CM | POA: Diagnosis not present

## 2017-12-15 ENCOUNTER — Ambulatory Visit: Payer: 59

## 2017-12-15 ENCOUNTER — Ambulatory Visit
Admission: RE | Admit: 2017-12-15 | Discharge: 2017-12-15 | Disposition: A | Discharge status: Home / Self Care | Payer: 59 | Source: Ambulatory Visit | Attending: Radiation Oncology | Admitting: Radiation Oncology

## 2017-12-15 DIAGNOSIS — C50412 Malignant neoplasm of upper-outer quadrant of left female breast: Secondary | ICD-10-CM | POA: Diagnosis not present

## 2017-12-15 DIAGNOSIS — Z170 Estrogen receptor positive status [ER+]: Secondary | ICD-10-CM | POA: Diagnosis not present

## 2017-12-16 ENCOUNTER — Ambulatory Visit
Admission: RE | Admit: 2017-12-16 | Discharge: 2017-12-16 | Disposition: A | Discharge status: Home / Self Care | Payer: 59 | Source: Ambulatory Visit | Attending: Radiation Oncology | Admitting: Radiation Oncology

## 2017-12-16 ENCOUNTER — Ambulatory Visit: Payer: 59

## 2017-12-16 DIAGNOSIS — Z170 Estrogen receptor positive status [ER+]: Secondary | ICD-10-CM | POA: Diagnosis not present

## 2017-12-16 DIAGNOSIS — C50412 Malignant neoplasm of upper-outer quadrant of left female breast: Secondary | ICD-10-CM | POA: Diagnosis not present

## 2017-12-19 ENCOUNTER — Ambulatory Visit: Payer: 59

## 2017-12-19 ENCOUNTER — Ambulatory Visit
Admission: RE | Admit: 2017-12-19 | Discharge: 2017-12-19 | Disposition: A | Discharge status: Home / Self Care | Payer: 59 | Source: Ambulatory Visit | Attending: Radiation Oncology | Admitting: Radiation Oncology

## 2017-12-19 DIAGNOSIS — C50412 Malignant neoplasm of upper-outer quadrant of left female breast: Secondary | ICD-10-CM | POA: Diagnosis not present

## 2017-12-19 DIAGNOSIS — Z170 Estrogen receptor positive status [ER+]: Secondary | ICD-10-CM | POA: Diagnosis not present

## 2017-12-20 ENCOUNTER — Ambulatory Visit: Payer: 59

## 2017-12-20 ENCOUNTER — Ambulatory Visit
Admission: RE | Admit: 2017-12-20 | Discharge: 2017-12-20 | Disposition: A | Discharge status: Home / Self Care | Payer: 59 | Source: Ambulatory Visit | Attending: Radiation Oncology | Admitting: Radiation Oncology

## 2017-12-20 DIAGNOSIS — C50412 Malignant neoplasm of upper-outer quadrant of left female breast: Secondary | ICD-10-CM | POA: Diagnosis not present

## 2017-12-20 DIAGNOSIS — Z170 Estrogen receptor positive status [ER+]: Secondary | ICD-10-CM | POA: Diagnosis not present

## 2017-12-21 ENCOUNTER — Ambulatory Visit: Payer: 59

## 2017-12-21 ENCOUNTER — Ambulatory Visit
Admission: RE | Admit: 2017-12-21 | Discharge: 2017-12-21 | Disposition: A | Discharge status: Home / Self Care | Payer: 59 | Source: Ambulatory Visit | Attending: Radiation Oncology | Admitting: Radiation Oncology

## 2017-12-21 DIAGNOSIS — C50412 Malignant neoplasm of upper-outer quadrant of left female breast: Secondary | ICD-10-CM | POA: Diagnosis not present

## 2017-12-21 DIAGNOSIS — Z170 Estrogen receptor positive status [ER+]: Secondary | ICD-10-CM | POA: Diagnosis not present

## 2017-12-22 ENCOUNTER — Ambulatory Visit: Payer: 59

## 2017-12-22 ENCOUNTER — Ambulatory Visit
Admission: RE | Admit: 2017-12-22 | Discharge: 2017-12-22 | Disposition: A | Discharge status: Home / Self Care | Payer: 59 | Source: Ambulatory Visit | Attending: Radiation Oncology | Admitting: Radiation Oncology

## 2017-12-22 DIAGNOSIS — C50412 Malignant neoplasm of upper-outer quadrant of left female breast: Secondary | ICD-10-CM | POA: Diagnosis not present

## 2017-12-22 DIAGNOSIS — Z170 Estrogen receptor positive status [ER+]: Secondary | ICD-10-CM | POA: Diagnosis not present

## 2017-12-22 MED FILL — LANTUS SOLOSTAR 100 UNITS/M: 100 | 90 days supply | Qty: 72 | Fill #0

## 2017-12-23 ENCOUNTER — Ambulatory Visit: Payer: 59

## 2017-12-23 ENCOUNTER — Ambulatory Visit
Admission: RE | Admit: 2017-12-23 | Discharge: 2017-12-23 | Disposition: A | Discharge status: Home / Self Care | Payer: 59 | Source: Ambulatory Visit | Attending: Radiation Oncology | Admitting: Radiation Oncology

## 2017-12-23 DIAGNOSIS — C50412 Malignant neoplasm of upper-outer quadrant of left female breast: Secondary | ICD-10-CM | POA: Diagnosis not present

## 2017-12-23 DIAGNOSIS — Z170 Estrogen receptor positive status [ER+]: Secondary | ICD-10-CM | POA: Diagnosis not present

## 2017-12-26 ENCOUNTER — Ambulatory Visit
Admission: RE | Admit: 2017-12-26 | Discharge: 2017-12-26 | Disposition: A | Discharge status: Home / Self Care | Payer: 59 | Source: Ambulatory Visit | Attending: Radiation Oncology | Admitting: Radiation Oncology

## 2017-12-26 ENCOUNTER — Ambulatory Visit: Payer: 59

## 2017-12-26 DIAGNOSIS — Z170 Estrogen receptor positive status [ER+]: Secondary | ICD-10-CM | POA: Diagnosis not present

## 2017-12-26 DIAGNOSIS — C50412 Malignant neoplasm of upper-outer quadrant of left female breast: Secondary | ICD-10-CM | POA: Diagnosis not present

## 2017-12-26 MED FILL — PHENTERMINE 37.5 MG TABLET: 37.5 | 30 days supply | Qty: 30 | Fill #3

## 2017-12-27 ENCOUNTER — Ambulatory Visit: Payer: 59

## 2017-12-27 ENCOUNTER — Ambulatory Visit
Admission: RE | Admit: 2017-12-27 | Discharge: 2017-12-27 | Disposition: A | Discharge status: Home / Self Care | Payer: 59 | Source: Ambulatory Visit | Attending: Radiation Oncology | Admitting: Radiation Oncology

## 2017-12-27 DIAGNOSIS — Z170 Estrogen receptor positive status [ER+]: Secondary | ICD-10-CM | POA: Diagnosis not present

## 2017-12-27 DIAGNOSIS — C50412 Malignant neoplasm of upper-outer quadrant of left female breast: Secondary | ICD-10-CM | POA: Diagnosis not present

## 2017-12-28 ENCOUNTER — Ambulatory Visit
Admission: RE | Admit: 2017-12-28 | Discharge: 2017-12-28 | Disposition: A | Discharge status: Home / Self Care | Payer: 59 | Source: Ambulatory Visit | Attending: Radiation Oncology | Admitting: Radiation Oncology

## 2017-12-28 DIAGNOSIS — Z170 Estrogen receptor positive status [ER+]: Secondary | ICD-10-CM | POA: Diagnosis not present

## 2017-12-28 DIAGNOSIS — C50412 Malignant neoplasm of upper-outer quadrant of left female breast: Secondary | ICD-10-CM | POA: Diagnosis not present

## 2017-12-28 NOTE — Progress Notes (Signed)
FMLA successfully faxed to [employer] at [phone]. Mailed copy to patient address on file. 

## 2017-12-29 ENCOUNTER — Ambulatory Visit: Payer: 59

## 2017-12-29 ENCOUNTER — Ambulatory Visit
Admission: RE | Admit: 2017-12-29 | Discharge: 2017-12-29 | Disposition: A | Discharge status: Home / Self Care | Payer: 59 | Source: Ambulatory Visit | Attending: Radiation Oncology | Admitting: Radiation Oncology

## 2017-12-29 DIAGNOSIS — C50412 Malignant neoplasm of upper-outer quadrant of left female breast: Secondary | ICD-10-CM | POA: Diagnosis not present

## 2017-12-29 DIAGNOSIS — Z170 Estrogen receptor positive status [ER+]: Secondary | ICD-10-CM | POA: Diagnosis not present

## 2017-12-30 ENCOUNTER — Ambulatory Visit
Admission: RE | Admit: 2017-12-30 | Discharge: 2017-12-30 | Disposition: A | Discharge status: Home / Self Care | Payer: 59 | Source: Ambulatory Visit | Attending: Radiation Oncology | Admitting: Radiation Oncology

## 2017-12-30 ENCOUNTER — Ambulatory Visit: Payer: 59

## 2017-12-30 DIAGNOSIS — C50412 Malignant neoplasm of upper-outer quadrant of left female breast: Secondary | ICD-10-CM | POA: Diagnosis not present

## 2017-12-30 DIAGNOSIS — Z170 Estrogen receptor positive status [ER+]: Secondary | ICD-10-CM | POA: Diagnosis not present

## 2018-01-02 ENCOUNTER — Ambulatory Visit: Payer: 59

## 2018-01-02 ENCOUNTER — Ambulatory Visit
Admission: RE | Admit: 2018-01-02 | Discharge: 2018-01-02 | Disposition: A | Discharge status: Home / Self Care | Payer: 59 | Source: Ambulatory Visit | Attending: Radiation Oncology | Admitting: Radiation Oncology

## 2018-01-02 DIAGNOSIS — Z170 Estrogen receptor positive status [ER+]: Secondary | ICD-10-CM | POA: Diagnosis not present

## 2018-01-02 DIAGNOSIS — C50412 Malignant neoplasm of upper-outer quadrant of left female breast: Secondary | ICD-10-CM | POA: Diagnosis not present

## 2018-01-03 ENCOUNTER — Ambulatory Visit
Admission: RE | Admit: 2018-01-03 | Discharge: 2018-01-03 | Disposition: A | Discharge status: Home / Self Care | Payer: 59 | Source: Ambulatory Visit | Attending: Radiation Oncology | Admitting: Radiation Oncology

## 2018-01-03 ENCOUNTER — Ambulatory Visit: Payer: 59 | Admitting: Radiation Oncology

## 2018-01-03 ENCOUNTER — Ambulatory Visit: Payer: 59

## 2018-01-03 DIAGNOSIS — Z170 Estrogen receptor positive status [ER+]: Secondary | ICD-10-CM | POA: Diagnosis not present

## 2018-01-03 DIAGNOSIS — C50412 Malignant neoplasm of upper-outer quadrant of left female breast: Secondary | ICD-10-CM | POA: Diagnosis not present

## 2018-01-05 ENCOUNTER — Ambulatory Visit: Payer: 59

## 2018-01-05 ENCOUNTER — Ambulatory Visit
Admission: RE | Admit: 2018-01-05 | Discharge: 2018-01-05 | Disposition: A | Discharge status: Home / Self Care | Payer: 59 | Source: Ambulatory Visit | Attending: Radiation Oncology | Admitting: Radiation Oncology

## 2018-01-05 DIAGNOSIS — C50412 Malignant neoplasm of upper-outer quadrant of left female breast: Secondary | ICD-10-CM | POA: Diagnosis not present

## 2018-01-05 DIAGNOSIS — Z170 Estrogen receptor positive status [ER+]: Secondary | ICD-10-CM | POA: Diagnosis not present

## 2018-01-06 ENCOUNTER — Ambulatory Visit
Admission: RE | Admit: 2018-01-06 | Discharge: 2018-01-06 | Disposition: A | Discharge status: Home / Self Care | Payer: 59 | Source: Ambulatory Visit | Attending: Radiation Oncology | Admitting: Radiation Oncology

## 2018-01-06 ENCOUNTER — Ambulatory Visit: Payer: 59

## 2018-01-06 DIAGNOSIS — Z170 Estrogen receptor positive status [ER+]: Secondary | ICD-10-CM | POA: Diagnosis not present

## 2018-01-06 DIAGNOSIS — C50412 Malignant neoplasm of upper-outer quadrant of left female breast: Secondary | ICD-10-CM | POA: Diagnosis not present

## 2018-01-09 ENCOUNTER — Ambulatory Visit: Payer: 59

## 2018-01-09 ENCOUNTER — Ambulatory Visit
Admission: RE | Admit: 2018-01-09 | Discharge: 2018-01-09 | Disposition: A | Discharge status: Home / Self Care | Payer: 59 | Source: Ambulatory Visit | Attending: Radiation Oncology | Admitting: Radiation Oncology

## 2018-01-09 DIAGNOSIS — Z170 Estrogen receptor positive status [ER+]: Secondary | ICD-10-CM | POA: Diagnosis not present

## 2018-01-09 DIAGNOSIS — C50412 Malignant neoplasm of upper-outer quadrant of left female breast: Secondary | ICD-10-CM | POA: Diagnosis not present

## 2018-01-10 ENCOUNTER — Ambulatory Visit: Payer: 59

## 2018-01-10 ENCOUNTER — Ambulatory Visit
Admission: RE | Admit: 2018-01-10 | Discharge: 2018-01-10 | Disposition: A | Discharge status: Home / Self Care | Payer: 59 | Source: Ambulatory Visit | Attending: Radiation Oncology | Admitting: Radiation Oncology

## 2018-01-10 DIAGNOSIS — C50412 Malignant neoplasm of upper-outer quadrant of left female breast: Secondary | ICD-10-CM

## 2018-01-10 DIAGNOSIS — Z170 Estrogen receptor positive status [ER+]: Secondary | ICD-10-CM | POA: Diagnosis not present

## 2018-01-10 MED ORDER — SONAFINE EX EMUL
1.0000 "application " | Freq: Two times a day (BID) | CUTANEOUS | Status: DC
  Administered 2018-01-10: 1 via TOPICAL

## 2018-01-10 MED ORDER — ALRA NON-METALLIC DEODORANT (RAD-ONC)
1.0000 "application " | Freq: Once | TOPICAL | Status: AC
  Administered 2018-01-10: 1 via TOPICAL

## 2018-01-12 ENCOUNTER — Ambulatory Visit
Admission: RE | Admit: 2018-01-12 | Discharge: 2018-01-12 | Disposition: A | Discharge status: Home / Self Care | Payer: 59 | Source: Ambulatory Visit | Attending: Radiation Oncology | Admitting: Radiation Oncology

## 2018-01-12 DIAGNOSIS — Z170 Estrogen receptor positive status [ER+]: Secondary | ICD-10-CM | POA: Insufficient documentation

## 2018-01-12 DIAGNOSIS — C50412 Malignant neoplasm of upper-outer quadrant of left female breast: Secondary | ICD-10-CM | POA: Diagnosis not present

## 2018-01-13 ENCOUNTER — Ambulatory Visit
Admission: RE | Admit: 2018-01-13 | Discharge: 2018-01-13 | Disposition: A | Discharge status: Home / Self Care | Payer: 59 | Source: Ambulatory Visit | Attending: Radiation Oncology | Admitting: Radiation Oncology

## 2018-01-13 DIAGNOSIS — C50412 Malignant neoplasm of upper-outer quadrant of left female breast: Secondary | ICD-10-CM | POA: Diagnosis not present

## 2018-01-13 DIAGNOSIS — Z170 Estrogen receptor positive status [ER+]: Secondary | ICD-10-CM | POA: Diagnosis not present

## 2018-01-14 ENCOUNTER — Ambulatory Visit: Payer: 59

## 2018-01-16 ENCOUNTER — Ambulatory Visit
Admission: RE | Admit: 2018-01-16 | Discharge: 2018-01-16 | Disposition: A | Discharge status: Home / Self Care | Payer: 59 | Source: Ambulatory Visit | Attending: Radiation Oncology | Admitting: Radiation Oncology

## 2018-01-16 ENCOUNTER — Telehealth: Payer: Self-pay | Admitting: Hematology and Oncology

## 2018-01-16 ENCOUNTER — Inpatient Hospital Stay: Payer: 59 | Attending: Hematology and Oncology | Admitting: Hematology and Oncology

## 2018-01-16 DIAGNOSIS — Z170 Estrogen receptor positive status [ER+]: Secondary | ICD-10-CM

## 2018-01-16 DIAGNOSIS — C50412 Malignant neoplasm of upper-outer quadrant of left female breast: Secondary | ICD-10-CM | POA: Diagnosis not present

## 2018-01-16 MED ORDER — ANASTROZOLE 1 MG PO TABS: 1.0000 mg | ORAL_TABLET | Freq: Every day | ORAL | 3 refills | Status: DC

## 2018-01-16 NOTE — Assessment & Plan Note (Signed)
10/11/2017:Left lumpectomy: IDC grade 2, 2.1 cm, margins Negative 0.1 cm to the anterior margin, UDH, 0/3 lymph nodes negative, ER 95%, PR 80%, HER-2 negative, Ki-67 10%, T2N0 stage IA Oncotype DX recurrence score 23: Risk of distant recurrence at 9 years with hormone therapy alone 9% no chemo benefit Adjuvant radiation therapy 11/29/2017 to 01/13/2018  Treatment plan: Adjuvant antiestrogen therapy with anastrozole 1 mg daily to start 01/30/2018 Anastrozole counseling:We discussed the risks and benefits of anti-estrogen therapy with aromatase inhibitors. These include but not limited to insomnia, hot flashes, mood changes, vaginal dryness, bone density loss, and weight gain. We strongly believe that the benefits far outweigh the risks. Patient understands these risks and consented to starting treatment. Planned treatment duration is 7 years.  Return to clinic in 3 months for survivorship care plan visit

## 2018-01-16 NOTE — Telephone Encounter (Signed)
Gave avs and calendar ° °

## 2018-01-16 NOTE — Progress Notes (Signed)
Patient Care Team: [name], DO as PCP - General [name], MD as Consulting Physician (General Surgery)  DIAGNOSIS:  Encounter Diagnosis  Name Primary?  . Carcinoma of upper-outer quadrant of left breast in female, estrogen receptor positive ([place])     SUMMARY OF ONCOLOGIC HISTORY:   Carcinoma of upper-outer quadrant of left breast in female, estrogen receptor positive ([place])   10/09/2017 Initial Diagnosis    Carcinoma of upper-outer quadrant of left breast in female, estrogen receptor positive ([place])    10/20/2017 Surgery    Left lumpectomy: IDC grade 2, 2.1 cm, margins Negative 0.1 cm to the anterior margin, UDH, 0/3 lymph nodes negative, ER 95%, PR 80%, HER-2 negative, Ki-67 10%, T2N0 stage IA    10/25/2017 Cancer Staging    Staging form: Breast, AJCC 8th Edition - Pathologic: Stage IA (pT2, pN0(sn), cM0, G2, ER+, PR+, HER2-) - Signed by Nicholas Lose, MD on 10/25/2017    11/02/2017 Oncotype testing    Oncotype DX recurrence score 23, distant recurrence at 9 years 9%, no chemo benefit    11/29/2017 - 01/13/2018 Radiation Therapy    Adjuvant radiation therapy     CHIEF COMPLIANT: Follow-up at the end of radiation therapy  INTERVAL HISTORY: Alexis [name] is a 63 year old above-mentioned history of left breast cancer treated with lumpectomy and is finishing up her adjuvant radiation.  Tomorrow is her last radiation treatment.  She has some radiation dermatitis as well as intermittent discomfort in the left chest wall and axilla.  She also has moderate fatigue.  REVIEW OF SYSTEMS:   Constitutional: Denies fevers, chills or abnormal weight loss Eyes: Denies blurriness of vision Ears, nose, mouth, throat, and face: Denies mucositis or sore throat Respiratory: Denies cough, dyspnea or wheezes Cardiovascular: Denies palpitation, chest discomfort Gastrointestinal:  Denies nausea, heartburn or change in bowel habits Skin: Denies abnormal skin rashes Lymphatics:  Denies new lymphadenopathy or easy bruising Neurological:Denies numbness, tingling or new weaknesses Behavioral/Psych: Mood is stable, no new changes  Extremities: No lower extremity edema Breast: Left radiation dermatitis All other systems were reviewed with the patient and are negative.  I have reviewed the past medical history, past surgical history, social history and family history with the patient and they are unchanged from previous note.  ALLERGIES:  is allergic to canagliflozin; empagliflozin; metformin hcl; and parlodel [bromocriptine mesylate].  MEDICATIONS:  Current Outpatient Medications  Medication Sig Dispense Refill  . ACCU-CHEK FASTCLIX LANCETS MISC USE 3 TIMES A DAY AS DIRECTED  3  . ACCU-CHEK GUIDE test strip USE 3 TIMES A DAY AS DIRECTED  3  . aspirin 81 MG tablet Take 81 mg by mouth every other day.     . Cholecalciferol (VITAMIN D PO) Take by mouth.    Marland Kitchen HYDROcodone-acetaminophen (NORCO) 7.5-325 MG tablet     . insulin glargine (LANTUS) 100 UNIT/ML injection Inject 60 Units into the skin at bedtime.     . insulin lispro (HUMALOG) 100 UNIT/ML injection Inject 28 Units into the skin 3 (three) times daily before meals.     . phentermine (ADIPEX-P) 37.5 MG tablet Take 37.5 mg by mouth daily.  3  . UNIFINE PENTIPS 31G X 5 MM MISC USE TWICE A DAY AS DIRECTED  2   No current facility-administered medications for this visit.     PHYSICAL EXAMINATION: ECOG PERFORMANCE STATUS: 1 - Symptomatic but completely ambulatory  Vitals:   01/16/18 1027  BP: 139/68  Pulse: 69  Resp: 18  Temp: 98.4 F (36.9  C)  SpO2: 95%   Filed Weights   01/16/18 1027  Weight: 209 lb 12.8 oz (95.2 kg)    GENERAL:alert, no distress and comfortable SKIN: skin color, texture, turgor are normal, no rashes or significant lesions EYES: normal, Conjunctiva are pink and non-injected, sclera clear OROPHARYNX:no exudate, no erythema and lips, buccal mucosa, and tongue normal  NECK: supple,  thyroid normal size, non-tender, without nodularity LYMPH:  no palpable lymphadenopathy in the cervical, axillary or inguinal LUNGS: clear to auscultation and percussion with normal breathing effort HEART: regular rate & rhythm and no murmurs and no lower extremity edema ABDOMEN:abdomen soft, non-tender and normal bowel sounds MUSCULOSKELETAL:no cyanosis of digits and no clubbing  NEURO: alert & oriented x 3 with fluent speech, no focal motor/sensory deficits EXTREMITIES: No lower extremity edema    LABORATORY DATA:  I have reviewed the data as listed CMP Latest Ref Rng & Units 10/07/2017 12/06/2016 08/02/2016  Glucose 70 - 99 mg/dL 253(H) 324(H) 242(H)  BUN 8 - 23 mg/dL 13 22(H) 9  Creatinine 0.44 - 1.00 mg/dL 0.60 1.09(H) 0.61  Sodium 135 - 145 mmol/L 138 131(L) 137  Potassium 3.5 - 5.1 mmol/L 4.2 4.5 4.0  Chloride 98 - 111 mmol/L 106 102 104  CO2 22 - 32 mmol/L 24 17(L) 24  Calcium 8.9 - 10.3 mg/dL 9.0 9.9 9.1  Total Protein 6.5 - 8.1 g/dL - 9.0(H) 7.0  Total Bilirubin 0.3 - 1.2 mg/dL - 1.5(H) 0.9  Alkaline Phos 38 - 126 U/L - 111 77  AST 15 - 41 U/L - 20 27  ALT 14 - 54 U/L - 25 27    Lab Results  Component Value Date   WBC 17.2 (H) 12/06/2016   HGB 16.5 (H) 12/06/2016   HCT 51.6 (H) 12/06/2016   MCV 87.6 12/06/2016   PLT 330 12/06/2016   NEUTROABS 14.8 (H) 12/06/2016    ASSESSMENT & PLAN:  Carcinoma of upper-outer quadrant of left breast in female, estrogen receptor positive ([place]) 10/11/2017:Left lumpectomy: IDC grade 2, 2.1 cm, margins Negative 0.1 cm to the anterior margin, UDH, 0/3 lymph nodes negative, ER 95%, PR 80%, HER-2 negative, Ki-67 10%, T2N0 stage IA Oncotype DX recurrence score 23: Risk of distant recurrence at 9 years with hormone therapy alone 9% no chemo benefit Adjuvant radiation therapy 11/29/2017 to 01/13/2018  Treatment plan: Adjuvant antiestrogen therapy with anastrozole 1 mg daily to start 01/30/2018 Anastrozole counseling:We discussed the risks and  benefits of anti-estrogen therapy with aromatase inhibitors. These include but not limited to insomnia, hot flashes, mood changes, vaginal dryness, bone density loss, and weight gain. We strongly believe that the benefits far outweigh the risks. Patient understands these risks and consented to starting treatment. Planned treatment duration is 7 years.  Return to clinic in 3 months for survivorship care plan visit     No orders of the defined types were placed in this encounter.  The patient has a good understanding of the overall plan. she agrees with it. she will call with any problems that may develop before the next visit here.   Harriette Ohara, MD 01/16/18

## 2018-01-17 ENCOUNTER — Ambulatory Visit: Payer: 59

## 2018-01-17 ENCOUNTER — Ambulatory Visit
Admission: RE | Admit: 2018-01-17 | Discharge: 2018-01-17 | Disposition: A | Discharge status: Home / Self Care | Payer: 59 | Source: Ambulatory Visit | Attending: Radiation Oncology | Admitting: Radiation Oncology

## 2018-01-17 DIAGNOSIS — Z170 Estrogen receptor positive status [ER+]: Secondary | ICD-10-CM | POA: Diagnosis not present

## 2018-01-17 DIAGNOSIS — C50412 Malignant neoplasm of upper-outer quadrant of left female breast: Secondary | ICD-10-CM | POA: Diagnosis not present

## 2018-01-18 ENCOUNTER — Encounter: Payer: Self-pay | Admitting: Radiation Oncology

## 2018-01-18 ENCOUNTER — Ambulatory Visit: Payer: 59

## 2018-01-18 NOTE — Progress Notes (Signed)
  Radiation Oncology         [phone]) [phone] ________________________________  Name: Alexis [name] MRN: [id number]  Date: 01/18/2018  DOB: 06/19/1955  End of Treatment Note  Diagnosis:    invasive ductal breast cancer (pT2, pN0(sn), cM0, G2, ER+, PR+, HER2-) UOQ carcinoma of left breast     Indication for treatment:  Curative       Radiation treatment dates:   1. 11/28/17-12/30       2. 01/10/18-01/17/18  Site/dose:   1. Left breast; 28 fractions of 1.8 Gy for a total of 50.4 Gy           2. Lumpectomy cavity Boost; 6 fractions of 2 Gy for a total of 12 Gy  Beams/energy:   1. 3D; 15X, 6X, 10X        2. 3D; 6X, 15X  Narrative: The patient tolerated radiation treatment relatively well.     At the beginning of treatment, pt reported mild fatigue. Towards the end of treatment, pt reported sharp shooting breast pain and mild fatigue as well as itching and burning. Pt developed hyperpigmentation for which she was given Sonafine cream. She reported mild lymphedema in the left arm with swelling and pain. No moist desquamation at the completion of treatment. Overall the pt was without complaints.   Plan: The patient has completed radiation treatment. The patient will return to radiation oncology clinic for routine followup in one month. I advised them to call or return sooner if they have any questions or concerns related to their recovery or treatment.  -----------------------------------  Blair Promise, PhD, MD  This document serves as a record of services personally performed by Gery Pray, MD. It was created on his behalf by Mary-Darly Loma Messing, a trained medical scribe. The creation of this record is based on the scribe's personal observations and the provider's statements to them. This document has been checked and approved by the attending provider.

## 2018-01-19 ENCOUNTER — Encounter: Payer: Self-pay | Admitting: Radiation Oncology

## 2018-01-20 MED FILL — UNIFINE PENTIPS 31GX3/16": 31G X 5 MM | 90 days supply | Qty: 200 | Fill #1

## 2018-01-20 MED FILL — UNIFINE PENTIPS 31GX3/16: 31G X 5 MM | 90 days supply | Qty: 200 | Fill #1

## 2018-01-20 MED FILL — ACCU-CHEK GUIDE STRP: 90 days supply | Qty: 300 | Fill #1

## 2018-01-20 MED FILL — ACCU-CHEK FASTCLIX LANCETS: 90 days supply | Qty: 306 | Fill #1

## 2018-02-17 ENCOUNTER — Telehealth: Payer: Self-pay | Admitting: *Deleted

## 2018-02-17 NOTE — Telephone Encounter (Signed)
CALLED PATIENT TO ALTER FU DUE TO DR. KINARD BEING IN THE OR, SPOKE WITH PATIENT AND SHE DECLINED FOR 02-23-18, DUE TO HAVING ANOTHER DOCTOR'S APPT., RESCHEDULED FOR 02-27-18 @ 4:15 PM, PATIENT AGREED TO NEW TIME AND DATE

## 2018-02-20 ENCOUNTER — Ambulatory Visit: Payer: 59 | Admitting: Radiation Oncology

## 2018-02-22 DIAGNOSIS — M5136 Other intervertebral disc degeneration, lumbar region: Secondary | ICD-10-CM | POA: Diagnosis not present

## 2018-02-22 DIAGNOSIS — K219 Gastro-esophageal reflux disease without esophagitis: Secondary | ICD-10-CM | POA: Diagnosis not present

## 2018-02-22 DIAGNOSIS — E6609 Other obesity due to excess calories: Secondary | ICD-10-CM | POA: Diagnosis not present

## 2018-02-22 DIAGNOSIS — C50919 Malignant neoplasm of unspecified site of unspecified female breast: Secondary | ICD-10-CM | POA: Diagnosis not present

## 2018-02-22 DIAGNOSIS — E109 Type 1 diabetes mellitus without complications: Secondary | ICD-10-CM | POA: Diagnosis not present

## 2018-02-22 DIAGNOSIS — M454 Ankylosing spondylitis of thoracic region: Secondary | ICD-10-CM | POA: Diagnosis not present

## 2018-02-22 DIAGNOSIS — E039 Hypothyroidism, unspecified: Secondary | ICD-10-CM | POA: Diagnosis not present

## 2018-02-23 ENCOUNTER — Ambulatory Visit: Payer: 59 | Admitting: Radiation Oncology

## 2018-02-23 DIAGNOSIS — E538 Deficiency of other specified B group vitamins: Secondary | ICD-10-CM | POA: Diagnosis not present

## 2018-02-23 DIAGNOSIS — R946 Abnormal results of thyroid function studies: Secondary | ICD-10-CM | POA: Diagnosis not present

## 2018-02-23 DIAGNOSIS — E1165 Type 2 diabetes mellitus with hyperglycemia: Secondary | ICD-10-CM | POA: Diagnosis not present

## 2018-02-23 DIAGNOSIS — Z8639 Personal history of other endocrine, nutritional and metabolic disease: Secondary | ICD-10-CM | POA: Diagnosis not present

## 2018-02-23 DIAGNOSIS — E785 Hyperlipidemia, unspecified: Secondary | ICD-10-CM | POA: Diagnosis not present

## 2018-02-27 ENCOUNTER — Encounter: Payer: Self-pay | Admitting: Radiation Oncology

## 2018-02-27 ENCOUNTER — Other Ambulatory Visit: Payer: Self-pay

## 2018-02-27 ENCOUNTER — Ambulatory Visit
Admission: RE | Admit: 2018-02-27 | Discharge: 2018-02-27 | Disposition: A | Discharge status: Home / Self Care | Payer: 59 | Source: Ambulatory Visit | Attending: Radiation Oncology | Admitting: Radiation Oncology

## 2018-02-27 VITALS — BP 151/64 | HR 67 | Temp 98.1°F | Resp 18 | Ht 65.0 in | Wt 209.0 lb

## 2018-02-27 DIAGNOSIS — Z794 Long term (current) use of insulin: Secondary | ICD-10-CM | POA: Diagnosis not present

## 2018-02-27 DIAGNOSIS — Z170 Estrogen receptor positive status [ER+]: Secondary | ICD-10-CM | POA: Insufficient documentation

## 2018-02-27 DIAGNOSIS — C50412 Malignant neoplasm of upper-outer quadrant of left female breast: Secondary | ICD-10-CM | POA: Diagnosis not present

## 2018-02-27 DIAGNOSIS — Z7982 Long term (current) use of aspirin: Secondary | ICD-10-CM | POA: Diagnosis not present

## 2018-02-27 DIAGNOSIS — Z79899 Other long term (current) drug therapy: Secondary | ICD-10-CM | POA: Insufficient documentation

## 2018-02-27 NOTE — Progress Notes (Addendum)
Radiation Oncology         [phone] ________________________________  Name: Alexis [name] MRN: [id number]  Date: 02/27/2018  DOB: [birth year]  Follow-Up Visit Note  CC: [name], Corey Skains, MD    ICD-10-CM   1. Carcinoma of upper-outer quadrant of left breast in female, estrogen receptor positive ([place]) C50.412    Z17.0     Diagnosis:   invasive ductal breast cancer (pT2, pN0(sn), cM0, G2, ER+, PR+, HER2-) UOQ carcinoma of left breast   Interval Since Last Radiation:  1 months, 1 week  Radiation treatment dates:   1. 11/28/17-12/30                                                   2. 01/10/18-01/17/18  Site/dose:   1. Left breast; 28 fractions of 1.8 Gy for a total of 50.4 Gy                      2. Lumpectomy cavity Boost; 6 fractions of 2 Gy for a total of 12 Gy  Narrative:  The patient returns today for routine follow-up.  she is doing well overall. 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              On review of systems, she reports some lingering fatigue. she denies any other symptoms. Pertinent positives are listed and detailed within the above HPI.                 ALLERGIES:  is allergic to canagliflozin; empagliflozin; metformin hcl; and parlodel [bromocriptine mesylate].  Meds: Current Outpatient Medications  Medication Sig Dispense Refill  . ACCU-CHEK FASTCLIX LANCETS MISC USE 3 TIMES A DAY AS DIRECTED  3  . ACCU-CHEK GUIDE test strip USE 3 TIMES A DAY AS DIRECTED  3  . anastrozole (ARIMIDEX) 1 MG tablet Take 1 tablet (1 mg total) by mouth daily. 90 tablet 3  . aspirin 81 MG tablet Take 81 mg by mouth every other day.     . Cholecalciferol (VITAMIN D PO) Take by mouth.    . insulin glargine (LANTUS) 100 UNIT/ML injection Inject 60 Units into the skin at bedtime.     . insulin lispro (HUMALOG) 100 UNIT/ML injection Inject 28 Units into the skin 3 (three)  times daily before meals.     . phentermine (ADIPEX-P) 37.5 MG tablet Take 37.5 mg by mouth daily.  3  . UNIFINE PENTIPS 31G X 5 MM MISC USE TWICE A DAY AS DIRECTED  2   No current facility-administered medications for this encounter.     Physical Findings: The patient is in no acute distress. Patient is alert and oriented.  height is 5' 5" (1.651 m) and weight is 209 lb (94.8 kg). Her oral temperature is 98.1 F (36.7 C). Her blood pressure is 151/64 (abnormal) and her pulse is 67. Her respiration is 18 and oxygen saturation is 97%. .  No significant changes. Lungs are clear to auscultation bilaterally. Heart has regular rate and rhythm. No palpable cervical, supraclavicular, or axillary adenopathy. Abdomen soft, non-tender, normal bowel sounds. Right breast with no palpable mass, nipple discharge, or bleeding. Left breast with hyperpigmentation changes throughout. Skin is well-healed. No dominant mass appreciated. No nipple  discharge or bleeding.    Lab Findings: Lab Results  Component Value Date   WBC 17.2 (H) 12/06/2016   HGB 16.5 (H) 12/06/2016   HCT 51.6 (H) 12/06/2016   MCV 87.6 12/06/2016   PLT 330 12/06/2016    Radiographic Findings: No results found.  Impression:  The patient is recovering from the effects of radiation.  No evidence of recurrence on clinical exam. She is tolerating anastrozole well at this time.  Plan:  Routine follow-up in 6 months and will see Dr. Gerkin in 3 months.  ____________________________________   James D. Kinard, PhD, MD    This document serves as a record of services personally performed by James Kinard, MD. It was created on his behalf by Mary-Zakariah Crabb, a trained medical scribe. The creation of this record is based on the scribe's personal observations and the provider's statements to them. This document has been checked and approved by the attending provider.   

## 2018-02-27 NOTE — Progress Notes (Signed)
Pt here for a follow-up today for cancer of the left breast. Pt states that she has occasional sharp shooting pain that comes and goes. Pt states having mild fatigue. Pt skin still has some hyperpigmentation. Pt states that she is using [place] Cocoa butter with vitamin E.   BP (!) 151/64 (BP Location: Right Arm, Patient Position: Sitting)   Pulse 67   Temp 98.1 F (36.7 C) (Oral)   Resp 18   Ht 5\' 5"  (1.651 m)   Wt 209 lb (94.8 kg)   SpO2 97%   BMI 34.78 kg/m    Wt Readings from Last 3 Encounters:  02/27/18 209 lb (94.8 kg)  01/16/18 209 lb 12.8 oz (95.2 kg)  11/10/17 208 lb 8 oz (94.6 kg)

## 2018-03-01 ENCOUNTER — Telehealth: Payer: Self-pay | Admitting: *Deleted

## 2018-03-01 NOTE — Telephone Encounter (Signed)
CALLED PATIENT TO INFORM OF FU ON 08-28-18 @ 3:15 PM, LVM FOR A RETURN CALL

## 2018-03-06 ENCOUNTER — Ambulatory Visit (INDEPENDENT_AMBULATORY_CARE_PROVIDER_SITE_OTHER): Payer: Self-pay | Admitting: Family Medicine

## 2018-03-06 VITALS — BP 150/70 | HR 72 | Temp 98.4°F | Resp 18 | Wt 209.0 lb

## 2018-03-06 DIAGNOSIS — R05 Cough: Secondary | ICD-10-CM

## 2018-03-06 DIAGNOSIS — R0981 Nasal congestion: Secondary | ICD-10-CM

## 2018-03-06 DIAGNOSIS — R059 Cough, unspecified: Secondary | ICD-10-CM

## 2018-03-06 DIAGNOSIS — J069 Acute upper respiratory infection, unspecified: Secondary | ICD-10-CM

## 2018-03-06 MED ORDER — AZELASTINE HCL 0.1 % NA SOLN: 1.0000 | Freq: Two times a day (BID) | NASAL | 0 refills | Status: DC

## 2018-03-06 MED ORDER — BENZONATATE 100 MG PO CAPS: 100.0000 mg | ORAL_CAPSULE | Freq: Two times a day (BID) | ORAL | 0 refills | Status: DC | PRN

## 2018-03-06 NOTE — Progress Notes (Signed)
Alexis [name] is a 63 y.o. female who presents today with cough for 6-7 days. She recently completed therapy for breast cancer and is concerned about the risk of bronchitis due to a close household contact who recently was dx with bronchitis after experiencing similar symptoms.  She denies any fever, chills or associated flu like symptoms is just concerned due to recent completion of therapy and concern for immunosuppression.  Review of Systems  Constitutional: Negative for chills, fever and malaise/fatigue.  HENT: Negative for congestion, ear discharge, ear pain, sinus pain and sore throat.   Eyes: Negative.   Respiratory: Negative for cough, sputum production and shortness of breath.   Cardiovascular: Negative.  Negative for chest pain.  Gastrointestinal: Negative for abdominal pain, diarrhea, nausea and vomiting.  Genitourinary: Negative for dysuria, frequency, hematuria and urgency.  Musculoskeletal: Negative for myalgias.  Skin: Negative.   Neurological: Negative for headaches.  Endo/Heme/Allergies: Negative.   Psychiatric/Behavioral: Negative.     Alexis [name] has a current medication list which includes the following prescription(s): accu-chek fastclix lancets, accu-chek guide, anastrozole, aspirin, vitamin d, insulin glargine, insulin lispro, levocetirizine, phentermine, unifine pentips, azelastine, and benzonatate. Also is allergic to canagliflozin; empagliflozin; metformin hcl; and parlodel [bromocriptine mesylate].  Alexis [name]  has a past medical history of ASCUS of cervix with negative high risk HPV (09/2016), Diabetes mellitus without complication ([place]), Diverticulitis, Facial trauma, Fall, History of bronchitis, History of kidney stones, History of urinary tract infection, Hyperparathyroidism ([place]), Parathyroid tumor, Plantar fasciitis, PONV (postoperative nausea and vomiting), VAIN I (vaginal intraepithelial neoplasia grade I) (04/2012, 09/2017), and Yeast infection. Also  has a past  surgical history that includes Colon surgery; Cholecystectomy; Carpal tunnel release; Appendectomy; Tubal ligation; Hysteroscopy (2003); LAVH (2004); Abdominal hysterectomy; colonscopy ; Dilation and curettage of uterus; Parathyroidectomy (N/A, 02/21/2015); and Partial mastectomy with axillary sentinel lymph node biopsy (Left, 10/11/2017).    O: Vitals:   03/06/18 1732  BP: (!) 150/70  Pulse: 72  Resp: 18  Temp: 98.4 F (36.9 C)  SpO2: 96%     Physical Exam Vitals signs (elevated blood pressure (systolic)) reviewed.  Constitutional:      Appearance: She is well-developed. She is not toxic-appearing.  HENT:     Head: Normocephalic.     Right Ear: Hearing, tympanic membrane, ear canal and external ear normal.     Left Ear: Hearing, tympanic membrane, ear canal and external ear normal.     Nose: Nose normal.     Mouth/Throat:     Pharynx: Uvula midline.  Neck:     Musculoskeletal: Normal range of motion and neck supple.  Cardiovascular:     Rate and Rhythm: Normal rate and regular rhythm.     Pulses: Normal pulses.     Heart sounds: Normal heart sounds.  Pulmonary:     Effort: Pulmonary effort is normal.     Breath sounds: Normal breath sounds. No transmitted upper airway sounds. No decreased breath sounds, wheezing, rhonchi or rales.     Comments: Dry non productive cough on exam. Lung sounds CTAB. Abdominal:     General: Bowel sounds are normal.     Palpations: Abdomen is soft.  Musculoskeletal: Normal range of motion.  Lymphadenopathy:     Head:     Right side of head: No submental or submandibular adenopathy.     Left side of head: No submental or submandibular adenopathy.     Cervical: No cervical adenopathy.  Neurological:     Mental Status: She is alert and oriented to  person, place, and time.    A: 1. Cough   2. Upper respiratory tract infection, unspecified type   3. Nasal congestion    P: Supportive care and continued monitoring of condition worsening or  progression in light of normal vital signs and physical exam. Additional follow up warranted if condition not improved or fever develops. 1. Cough - benzonatate (TESSALON) 100 MG capsule; Take 1 capsule (100 mg total) by mouth 2 (two) times daily as needed for cough.  2. Upper respiratory tract infection, unspecified type - benzonatate (TESSALON) 100 MG capsule; Take 1 capsule (100 mg total) by mouth 2 (two) times daily as needed for cough.  3. Nasal congestion - azelastine (ASTELIN) 0.1 % nasal spray; Place 1 spray into both nostrils 2 (two) times daily. Use in each nostril as directed  Other orders - levocetirizine (XYZAL) 5 MG tablet; Take 5 mg by mouth every evening.   Discussed with patient exam findings, suspected diagnosis etiology and  reviewed recommended treatment plan and follow up, including complications and indications for urgent medical follow up and evaluation. Medications including use and indications reviewed with patient. Patient provided relevant patient education on diagnosis and/or relevant related condition that were discussed and reviewed with patient at discharge. Patient verbalized understanding of information provided and agrees with plan of care (POC), all questions answered.

## 2018-03-06 NOTE — Patient Instructions (Signed)

## 2018-03-08 ENCOUNTER — Telehealth: Payer: Self-pay

## 2018-03-08 NOTE — Telephone Encounter (Signed)
Called and spoke with pt regarding how she is feeling since her visit with and she states she is feeling much better.

## 2018-03-20 DIAGNOSIS — C50412 Malignant neoplasm of upper-outer quadrant of left female breast: Secondary | ICD-10-CM | POA: Diagnosis not present

## 2018-03-20 DIAGNOSIS — Z170 Estrogen receptor positive status [ER+]: Secondary | ICD-10-CM | POA: Diagnosis not present

## 2018-03-20 DIAGNOSIS — Z9889 Other specified postprocedural states: Secondary | ICD-10-CM | POA: Diagnosis not present

## 2018-04-17 MED FILL — UNIFINE PENTIPS 31GX3/16": 31G X 5 MM | 90 days supply | Qty: 200 | Fill #0

## 2018-04-17 MED FILL — ACCU-CHEK GUIDE TEST STRIP: 90 days supply | Qty: 300 | Fill #0

## 2018-04-17 MED FILL — UNIFINE PENTIPS 31GX3/16: 31G X 5 MM | 90 days supply | Qty: 200 | Fill #0

## 2018-04-20 ENCOUNTER — Encounter: Payer: 59 | Admitting: Adult Health

## 2018-04-21 ENCOUNTER — Encounter: Payer: Self-pay | Admitting: General Practice

## 2018-04-21 NOTE — Progress Notes (Signed)
[facility] Team contacted patient to assess for food insecurity and other psychosocial needs during current COVID19 pandemic.  Still working at current job as Teaching laboratory technician at [employer].  Has  Patient/family expressed no needs at this time.  Support Team member encouraged patient to call if changes occur or they have any other questions/concerns.        Beverely Pace, [place]

## 2018-04-25 ENCOUNTER — Telehealth: Payer: Self-pay | Admitting: Adult Health

## 2018-04-25 NOTE — Telephone Encounter (Signed)
Spoke with patient and confirmed Web[facility] appointment. She already had the app and confirmed that she received the email.

## 2018-04-26 ENCOUNTER — Inpatient Hospital Stay: Payer: 59 | Attending: Hematology and Oncology | Admitting: Adult Health

## 2018-04-26 ENCOUNTER — Encounter: Payer: Self-pay | Admitting: Adult Health

## 2018-04-26 DIAGNOSIS — C50412 Malignant neoplasm of upper-outer quadrant of left female breast: Secondary | ICD-10-CM | POA: Diagnosis not present

## 2018-04-26 DIAGNOSIS — Z79818 Long term (current) use of other agents affecting estrogen receptors and estrogen levels: Secondary | ICD-10-CM | POA: Diagnosis not present

## 2018-04-26 DIAGNOSIS — Z170 Estrogen receptor positive status [ER+]: Secondary | ICD-10-CM

## 2018-04-26 DIAGNOSIS — Z923 Personal history of irradiation: Secondary | ICD-10-CM

## 2018-04-26 MED FILL — ANASTROZOLE 1 MG TABLET: 1 | 90 days supply | Qty: 90 | Fill #0

## 2018-04-26 MED FILL — PHENTERMINE 37.5 MG TABLET: 37.5 | 30 days supply | Qty: 30 | Fill #0

## 2018-04-26 NOTE — Progress Notes (Signed)
SURVIVORSHIP VIRTUAL VISIT:  I connected with Alexis [name] on 04/26/18 at 11:00 AM EDT by video and verified that I am speaking with the correct person using two identifiers.   I discussed the limitations, risks, security and privacy concerns of performing an evaluation and management service by telephone and the availability of in person appointments. I also discussed with the patient that there may be a patient responsible charge related to this service. The patient expressed understanding and agreed to proceed.    BRIEF ONCOLOGIC HISTORY:    Carcinoma of upper-outer quadrant of left breast in female, estrogen receptor positive ([place])   10/09/2017 Initial Diagnosis    Carcinoma of upper-outer quadrant of left breast in female, estrogen receptor positive ([place])    10/20/2017 Surgery    Left lumpectomy: IDC grade 2, 2.1 cm, margins Negative 0.1 cm to the anterior margin, UDH, 0/3 lymph nodes negative, ER 95%, PR 80%, HER-2 negative, Ki-67 10%, T2N0 stage IA    10/25/2017 Cancer Staging    Staging form: Breast, AJCC 8th Edition - Pathologic: Stage IA (pT2, pN0(sn), cM0, G2, ER+, PR+, HER2-) - Signed by Nicholas Lose, MD on 10/25/2017    11/02/2017 Oncotype testing    Oncotype DX recurrence score 23, distant recurrence at 9 years 9%, no chemo benefit    11/29/2017 - 01/13/2018 Radiation Therapy    Adjuvant radiation therapy    01/2018 -  Anti-estrogen oral therapy    Anastrozole daily     INTERVAL HISTORY:  Alexis [name] to review her survivorship care plan detailing her treatment course for breast cancer, as well as monitoring long-term side effects of that treatment, education regarding health maintenance, screening, and overall wellness and health promotion.     Overall, Alexis [name] reports feeling quite well.  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  She has minimal interaction with patients, however staff who have contact with patients  frequently around around her and passing through where she works.  She has risk of 5 for COVID complications which includes her age and comorbidities such as recent cancer diagnosis, diabetes, HTN, and obesity.    She notes a weight gain since starting Anastrozole.  She is walking frequently.  She notes she is doing this about 3-4 days per week.  She weighed herself yesterday and was 211.  She had an appointment with Dr. Leafy Ro prior to her breast cancer diagnosis.  She has been struggling to maintain her weight let alone lose weight and wants some suggestions related to this.    Ayra has some swelling in her left arm and tightness in her hand she noted twoards the end of radiation.  She says the left arm is larger than the right and hasn't been evaluated before.    Alexis [name] also notes hot flashes multiple times during the day and occasionally at night.  She says that these hot flashes are bothersome and worse with her wearing a mask all day at work.    Alexis [name] notes that initially when she started anastrozole she had a metal taste in her mouth for two days that has since resolved.  She also experienced some brief lower back pain, which has also resolved.  Otherwise, overall she is doing and feeling quite well.    REVIEW OF SYSTEMS:  Review of Systems  Constitutional: Negative for appetite change, chills, fatigue, fever and unexpected weight change.  HENT:   Negative for hearing loss, lump/mass, mouth sores, sore throat and trouble swallowing.  Eyes: Negative for eye problems and icterus.  Respiratory: Negative for chest tightness, cough and shortness of breath.   Cardiovascular: Negative for chest pain, leg swelling and palpitations.  Gastrointestinal: Negative for abdominal distention, abdominal pain, constipation, diarrhea, nausea and vomiting.  Endocrine: Positive for hot flashes.  Genitourinary: Negative for difficulty urinating.   Musculoskeletal: Negative for arthralgias.  Skin:  Negative for itching and rash.  Neurological: Negative for dizziness, extremity weakness, headaches and numbness.  Hematological: Negative for adenopathy. Does not bruise/bleed easily.  Psychiatric/Behavioral: Negative for confusion and suicidal ideas. The patient is not nervous/anxious.   Breast: Denies any new nodularity, masses, tenderness, nipple changes, or nipple discharge.      ONCOLOGY TREATMENT TEAM:  1. Surgeon:  Dr. Harlow Asa at [facility] 2. Medical Oncologist: Dr. Lindi Adie  3. Radiation Oncologist: Dr. Sondra Come    PAST MEDICAL/SURGICAL HISTORY:  Past Medical History:  Diagnosis Date  . ASCUS of cervix with negative high risk HPV 09/2016  . Diabetes mellitus without complication (HCC)    Type 2, IDDM  . Diverticulitis   . Facial trauma    forehead,nose and upper lip secondary to fall   . Fall   . History of bronchitis   . History of kidney stones   . History of urinary tract infection   . Hyperparathyroidism ([place])   . Parathyroid tumor   . Plantar fasciitis   . PONV (postoperative nausea and vomiting)    emotional when awakens / cries  . 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 Yeast infection    history of    Past Surgical History:  Procedure Laterality Date  . ABDOMINAL HYSTERECTOMY    . APPENDECTOMY    . CARPAL TUNNEL RELEASE     right and left   . CHOLECYSTECTOMY    . COLON SURGERY    . colonscopy     . DILATION AND CURETTAGE OF UTERUS    . HYSTEROSCOPY  2003  . LAVH  2004   leiomyoma/irregular bleeding  . PARATHYROIDECTOMY N/A 02/21/2015   Procedure: RIGHT PARATHYROIDECTOMY;  Surgeon: [name], MD;  Location: [facility];  Service: General;  Laterality: N/A;  . PARTIAL MASTECTOMY WITH AXILLARY SENTINEL LYMPH NODE BIOPSY Left 10/11/2017   Procedure: LEFT BREAST PARTIAL MASTECTOMY WITH AXILLARY SENTINEL LYMPH NODE BIOPSY;  Surgeon: [name], MD;  Location: [facility];  Service: General;  Laterality:  Left;  . TUBAL LIGATION       ALLERGIES:  Allergies  Allergen Reactions  . Canagliflozin     Other reaction(s): yeast infection  . Empagliflozin     Other reaction(s): upset stomach  . Metformin Hcl     Other reaction(s): diarrhea  . Parlodel [Bromocriptine Mesylate] Hives     CURRENT MEDICATIONS:  Outpatient Encounter Medications as of 04/26/2018  Medication Sig  . ACCU-CHEK FASTCLIX LANCETS MISC USE 3 TIMES A DAY AS DIRECTED  . ACCU-CHEK GUIDE test strip USE 3 TIMES A DAY AS DIRECTED  . anastrozole (ARIMIDEX) 1 MG tablet Take 1 tablet (1 mg total) by mouth daily.  Marland Kitchen aspirin 81 MG tablet Take 81 mg by mouth every other day.   Marland Kitchen azelastine (ASTELIN) 0.1 % nasal spray Place 1 spray into both nostrils 2 (two) times daily. Use in each nostril as directed  . benzonatate (TESSALON) 100 MG capsule Take 1 capsule (100 mg total) by mouth 2 (two) times daily as needed for cough.  . Cholecalciferol (VITAMIN D PO) Take by mouth.  Marland Kitchen  insulin glargine (LANTUS) 100 UNIT/ML injection Inject 60 Units into the skin at bedtime.   . insulin lispro (HUMALOG) 100 UNIT/ML injection Inject 28 Units into the skin 3 (three) times daily before meals.   Marland Kitchen levocetirizine (XYZAL) 5 MG tablet Take 5 mg by mouth every evening.  . phentermine (ADIPEX-P) 37.5 MG tablet Take 37.5 mg by mouth daily.  Marland Kitchen UNIFINE PENTIPS 31G X 5 MM MISC USE TWICE A DAY AS DIRECTED   No facility-administered encounter medications on file as of 04/26/2018.      ONCOLOGIC FAMILY HISTORY:  Family History  Problem Relation Age of Onset  . Heart attack Father   . Diabetes Brother   . Cancer Brother        Lung cancer     GENETIC COUNSELING/TESTING: Not at this time  SOCIAL HISTORY:  Social History   Socioeconomic History  . Marital status: Divorced    Spouse name: Not on file  . Number of children: Not on file  . Years of education: Not on file  . Highest education level: Not on file  Occupational History  . Not on  file  Social Needs  . Financial resource strain: Not on file  . Food insecurity:    Worry: Not on file    Inability: Not on file  . Transportation needs:    Medical: No    Non-medical: No  Tobacco Use  . Smoking status: Never Smoker  . Smokeless tobacco: Never Used  Substance and Sexual Activity  . Alcohol use: No    Alcohol/week: 0.0 standard drinks  . Drug use: No  . Sexual activity: Not Currently    Birth control/protection: Surgical    Comment: 1st intercourse 33 yo-5 partners  Lifestyle  . Physical activity:    Days per week: Not on file    Minutes per session: Not on file  . Stress: Not on file  Relationships  . Social connections:    Talks on phone: Not on file    Gets together: Not on file    Attends religious service: Not on file    Active member of club or organization: Not on file    Attends meetings of clubs or organizations: Not on file    Relationship status: Not on file  . Intimate partner violence:    Fear of current or ex partner: No    Emotionally abused: No    Physically abused: No    Forced sexual activity: No  Other Topics Concern  . Not on file  Social History Narrative  . Not on file     OBSERVATIONS/OBJECTIVE:  Patient sounds well.  Speech is not pressured, breathing normal and unlabored.  Normal behavior and mood.   LABORATORY DATA:  None for this visit.  DIAGNOSTIC IMAGING:  None for this visit.      ASSESSMENT AND PLAN:  Ms.. [name] is a pleasant 63 y.o. female with Stage IA left breast invasive ductal carcinoma, ER+/PR+/HER2-, diagnosed in 09/2017, treated with lumpectomy, adjuvant radiation therapy, and anti-estrogen therapy with Anastrozole beginning in 01/2018.  She presents to the Survivorship Clinic for our initial meeting and routine follow-up post-completion of treatment for breast cancer.    1. Stage IA left breast cancer:  Ms. Eley is continuing to recover from definitive treatment for breast cancer. She will  follow-up with her medical oncologist, Dr. Lindi Adie in 09/2018 with history and physical exam per surveillance protocol.  She will continue her anti-estrogen therapy with Anastrozole. Thus far, she  is tolerating the Anastrozole well, with minimal side effects. She was instructed to make Dr. Lindi Adie or myself aware if she begins to experience any worsening side effects of the medication and I could see her back in clinic to help manage those side effects, as needed.   Her mammogram is due 09/2018; orders placed today.  Her breast density is category C. Today, a comprehensive survivorship care plan and treatment summary was reviewed with the patient today detailing her breast cancer diagnosis, treatment course, potential late/long-term effects of treatment, appropriate follow-up care with recommendations for the future, and patient education resources.  A copy of this summary, along with a letter will be sent to the patient's primary care provider via mail/fax/In Basket message after today's visit.     2. COVID 19 risk: Alexis [name] has continued to work.  She is not working in a direct patient care area, which is very important.  However, I am concerned with the location of her desk in relation to traffic and potential exposure to front line staff.  It would be optimal if her employer could accommodate a more remote location in the office for her to work in that doesn't have so much traffic.  She of course is wearing a mask and is being careful.  Her risk for complications is moderate, a level 5.    3. Hot flashes: We reviewed non pharmacologic interventions including avoiding caffeine, hot/spicy foods, ETOH, tobacco.  We discussed acupuncture.  We talked about changing the time of day of her medication.  We also reviewe/d the possibility of starting either Gabapentin or Effexor for the hot flashes and risks and benefits of each medication.  For now, she will start taking the Anastrozole at night and will call if the  hot flashes persist and we can send in Gabapentin or Effexor for her to take.    4.  Left arm swelling: I cannot evaluate her arm right now, but it sounds like she has lymphedema.  I will write a script for a sleeve and glove for her and will fax it to either [facility] [employer] or [facility].  She will send me a message with which one she wants the order faxed to.  I will also refer her for physical therapy, however the office is currently closed, and I am not sure when someone will be able to evaluate her.  She knows to let me know if anything worsens acutely, as she may need more urgent intervention and for this, we have a therapist who is willing to come in.    5. Obesity: Adrieana weighs 211 and is 64 inches (5'4").  She is obese and is struggling with her weight.  I recommended she continue walking regularly.  We discussed weight loss strategies, particularly after COVID 19 resolves.  We reviewed her seeing Dr. Leafy Ro and she is going to consider this as well.    6. Bone health:  Given Ms. Selk's age/history of breast cancer and her current treatment regimen including anti-estrogen therapy with Anastrozole, she is at risk for bone demineralization.  Her last DEXA scan was in 09/2016 and showed a T score of -0.5 which is normal.  Dr. Phineas Real follows her bone density and I recommended she continue to f/u with him regarding her bone density.  She needs one repeated by 01/2020 (two years after starting bone density at minimum, and every 2 years thereafter).  She was given education on specific activities to promote bone health.  7. Cancer screening:  Due to Ms. Leard's history and her age, she should receive screening for skin cancers, colon cancer, and gynecologic cancers.  The information and recommendations are listed on the patient's comprehensive care plan/treatment summary and were reviewed in detail with the patient.    8. Health maintenance and wellness promotion: Ms. Fleeman  was encouraged to consume 5-7 servings of fruits and vegetables per day. We reviewed the "Nutrition Rainbow" handout.  She was also encouraged to engage in moderate to vigorous exercise for 30 minutes per day most days of the week. We discussed the [employer] fitness program, which is designed for cancer survivors to help them become more physically fit after cancer treatments.  She was instructed to limit her alcohol consumption and continue to abstain from tobacco use.     9. Support services/counseling: It is not uncommon for this period of the patient's cancer care trajectory to be one of many emotions and stressors.  We discussed how this can be increasingly difficult during the times of quarantine and social distancing due to the COVID-19 pandemic.   She was given information regarding our available services and encouraged to contact me with any questions or for help enrolling in any of our support group/programs.    Follow up instructions:    -Return to cancer center in 09/2018 for f/u with Dr. Lindi Adie  -Mammogram due in 09/2018 -Follow up with surgery per Dr. Harlow Asa -She is welcome to return back to the Survivorship Clinic at any time; no additional follow-up needed at this time.  -Consider referral back to survivorship as a long-term survivor for continued surveillance  The patient was provided an opportunity to ask questions and all were answered. The patient agreed with the plan and demonstrated an understanding of the instructions.   The patient was advised to call back or seek an in-person evaluation if the symptoms worsen or if the condition fails to improve as anticipated.   I provided 45 minutes of non-face-to-face time during this encounter.   Scot Dock, NP

## 2018-05-02 ENCOUNTER — Ambulatory Visit: Payer: 59 | Attending: Adult Health | Admitting: Rehabilitation

## 2018-05-02 ENCOUNTER — Encounter: Payer: Self-pay | Admitting: Rehabilitation

## 2018-05-02 ENCOUNTER — Other Ambulatory Visit: Payer: Self-pay

## 2018-05-02 DIAGNOSIS — M25612 Stiffness of left shoulder, not elsewhere classified: Secondary | ICD-10-CM | POA: Diagnosis not present

## 2018-05-02 DIAGNOSIS — C50412 Malignant neoplasm of upper-outer quadrant of left female breast: Secondary | ICD-10-CM | POA: Insufficient documentation

## 2018-05-02 DIAGNOSIS — Z170 Estrogen receptor positive status [ER+]: Secondary | ICD-10-CM | POA: Insufficient documentation

## 2018-05-02 DIAGNOSIS — L599 Disorder of the skin and subcutaneous tissue related to radiation, unspecified: Secondary | ICD-10-CM | POA: Diagnosis not present

## 2018-05-02 NOTE — Patient Instructions (Signed)
Access Code: [email or web address]  URL: [email or web address]  Date: 05/02/2018  Prepared by: Shan Levans   Exercises  Sidelying Thoracic Rotation with Open Book - 10 reps - 1 sets - 6 second hold - 1x daily - 7x weekly  Doorway Pec Stretch at 120 Degrees Abduction - 3 reps - 1 sets - 30 hold - 1x daily - 7x weekly  Standing Row with Anchored Resistance - 10 reps - 1-3 sets - 1x daily - 7x weekly  Seated Shoulder W - 5 reps - 1-3 sets - 1x daily - 7x weekly

## 2018-05-02 NOTE — Therapy (Signed)
[place], [place], [address] Phone: [phone]   Fax:  [phone]  Physical Therapy Evaluation  Patient Details  Name: CATTLEYA DOBRATZ MRN: [id number] Date of Birth: 03-31-1955 Referring Provider (PT): Wilber Bihari NP   Encounter Date: 05/02/2018  PT End of Session - 05/02/18 1317    Visit Number  1    Number of Visits  7    Date for PT Re-Evaluation  06/13/18    PT Start Time  1203    PT Stop Time  1249    PT Time Calculation (min)  46 min    Activity Tolerance  Patient tolerated treatment well    Behavior During Therapy  [facility] for tasks assessed/performed       Past Medical History:  Diagnosis Date  . ASCUS of cervix with negative high risk HPV 09/2016  . Diabetes mellitus without complication (HCC)    Type 2, IDDM  . Diverticulitis   . Facial trauma    forehead,nose and upper lip secondary to fall   . Fall   . History of bronchitis   . History of kidney stones   . History of urinary tract infection   . Hyperparathyroidism ([place])   . Parathyroid tumor   . Plantar fasciitis   . PONV (postoperative nausea and vomiting)    emotional when awakens / cries  . 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 Yeast infection    history of     Past Surgical History:  Procedure Laterality Date  . ABDOMINAL HYSTERECTOMY    . APPENDECTOMY    . CARPAL TUNNEL RELEASE     right and left   . CHOLECYSTECTOMY    . COLON SURGERY    . colonscopy     . DILATION AND CURETTAGE OF UTERUS    . HYSTEROSCOPY  2003  . LAVH  2004   leiomyoma/irregular bleeding  . PARATHYROIDECTOMY N/A 02/21/2015   Procedure: RIGHT PARATHYROIDECTOMY;  Surgeon: [name], MD;  Location: [facility];  Service: General;  Laterality: N/A;  . PARTIAL MASTECTOMY WITH AXILLARY SENTINEL LYMPH NODE BIOPSY Left 10/11/2017   Procedure: LEFT BREAST PARTIAL MASTECTOMY WITH AXILLARY SENTINEL LYMPH NODE BIOPSY;   Surgeon: [name], MD;  Location: [facility];  Service: General;  Laterality: Left;  . TUBAL LIGATION      There were no vitals filed for this visit.   Subjective Assessment - 05/02/18 1158    Subjective  In raising my arm it is tight, sometimes the arm gets tight but not all the time    Pertinent History  Left partial mastectomy 10/11/17 due to left IDC (0/3 nodes positive) Dr.Gherkin  ER/PR positive, HER2 negative stage IA breast cancer. Radiation completed 11/29/17-01/13/18. On daily anastrozole.  History also includes DM, HTN, kidney stones, 12 in of colon removed due to polyp    Limitations  Lifting    Patient Stated Goals  hard to hook my bra, and wash my back, sleep is uncomfortable    Currently in Pain?  No/denies   pain only when moving the arm up around 4/10        [facility] PT Assessment - 05/02/18 0001      Assessment   Medical Diagnosis  Lt breast cancer    Referring Provider (PT)  Wilber Bihari NP    Onset Date/Surgical Date  10/11/17    Hand Dominance  Right    Prior Therapy  no  Precautions   Precaution Comments  lymphedema cancer      Restrictions   Weight Bearing Restrictions  No      Balance Screen   Has the patient fallen in the past 6 months  No    Has the patient had a decrease in activity level because of a fear of falling?   No    Is the patient reluctant to leave their home because of a fear of falling?   No      Home Film[place]video [place] residence    Living Arrangements  Children    Additional Comments  lives with 5 grandkids      Prior Function   Level of Independence  Independent    Vocation  Full time employment    Leisure  walking      Cognition   Overall Cognitive Status  Within Functional Limits for tasks assessed      Observation/Other Assessments   Observations  well healed incision some scar tissue evident inferiorly that pt reports she works on with cocoa butter, radiation field evident       Sensation   Light Touch  Appears Intact      Coordination   Gross Motor Movements are Fluid and Coordinated  Yes      Posture/Postural Control   Posture/Postural Control  Postural limitations    Postural Limitations  Rounded Shoulders;Forward head      ROM / Strength   AROM / PROM / Strength  AROM;PROM;Strength      AROM   Overall AROM Comments  pulling in pectoralis with lt shoulder motions    AROM Assessment Site  Shoulder    Right/Left Shoulder  Right;Left    Right Shoulder Extension  60 Degrees    Right Shoulder Flexion  141 Degrees    Right Shoulder ABduction  154 Degrees    Right Shoulder Internal Rotation  75 Degrees    Right Shoulder External Rotation  80 Degrees    Right Shoulder Horizontal ABduction  45 Degrees    Left Shoulder Extension  50 Degrees    Left Shoulder Flexion  147 Degrees   axilla tightness   Left Shoulder ABduction  146 Degrees    Left Shoulder Internal Rotation  75 Degrees    Left Shoulder External Rotation  75 Degrees    Left Shoulder Horizontal ABduction  40 Degrees      PROM   Overall PROM Comments  PROM not significantly limited with Rt side more limited than the left but with more pectoralis tightness on the left with Y position and ER at 90 degrees      Strength   Strength Assessment Site  Shoulder    Right/Left Shoulder  Right;Left        LYMPHEDEMA/ONCOLOGY QUESTIONNAIRE - 05/02/18 1245      Type   Cancer Type  Lt breast      Surgeries   Lumpectomy Date  10/11/17   MD called it modified mastectomy   Number Lymph Nodes Removed  3      Treatment   Active Chemotherapy Treatment  No    Past Chemotherapy Treatment  No    Active Radiation Treatment  No    Past Radiation Treatment  Yes    Current Hormone Treatment  Yes    Drug Name  anastrozole      What other symptoms do you have   Are you Having Heaviness or Tightness  Yes  Are you having Pain  No      Lymphedema Assessments   Lymphedema Assessments  Upper extremities       Right Upper Extremity Lymphedema   10 cm Proximal to Olecranon Process  34.5 cm    Olecranon Process  28.5 cm    15 cm Proximal to Ulnar Styloid Process  26.7 cm    10 cm Proximal to Ulnar Styloid Process  24.3 cm    Just Proximal to Ulnar Styloid Process  19.6 cm    Across Hand at [employer]  21 cm    At [place] of 2nd Digit  7.3 cm      Left Upper Extremity Lymphedema   15 cm Proximal to Olecranon Process  35.9 cm    10 cm Proximal to Olecranon Process  34.3 cm    Olecranon Process  30 cm    10 cm Proximal to Ulnar Styloid Process  25.8 cm    Just Proximal to Ulnar Styloid Process  19.8 cm    Across Hand at [employer]  21.2 cm    At [place] of 2nd Digit  7.6 cm    Other  36length    Other  knuckles 19.5    Other  finger length to first IP joint 4cm             Objective measurements completed on examination: See above findings.      [place] Adult PT Treatment/Exercise - 05/02/18 0001      Exercises   Exercises  Other Exercises    Other Exercises   given medbridge HEP with peformance of each; open book, doorway stretch, XYW, row with red band      Manual Therapy   Manual Therapy  Edema management    Manual therapy comments  discussed compression options with patient wanting to check benefits first before ordering. measurements taken today for potential order             PT Education - 05/02/18 1316    Education Details  HEP, lymphedema, garments, POC    Person(s) Educated  Patient    Methods  Explanation;Demonstration;Verbal cues;Handout    Comprehension  Verbalized understanding;Returned demonstration;Verbal cues required;Tactile cues required          PT Long Term Goals - 05/02/18 1325      PT LONG TERM GOAL #1   Title  Pt will improve Left shoulder AROM to no pulling in the anterior chest    Time  6    Period  Weeks    Status  New      PT LONG TERM GOAL #2   Title  Pt will be ind with HEP for lt shoulder mobility and general strength     Time  6    Period  Weeks    Status  New      PT LONG TERM GOAL #3   Title  Pt will be set up with how to obtain compression for the Lt UE and uses    Time  6    Period  Weeks    Status  New             Plan - 05/02/18 1318    Clinical Impression Statement  Pt presents today after completing treatment for Lt breast cancer in January of this year.  Lt lumpectomy with radiation and 3 lymph nodes removed.  Pt experiencing axilla and chest tightness with overhead and behind the back movements that  has not spontaneously resolved.  Pt demonstrates decreased shoulder AROM bilaterally with most deficits mostly from the left pectoralis tightness. some scar tissue evident under incision but pt already working on it at home.  Some very mild lymphedema in the Lt UE with pt reporting the UE feels heavy and the hand swollen intermittently.  Due to covid-19 at this time we will check in on the patient in 2 weeks and most likely advance to supine scapular exercises.  Pt would like to wait for insurance check before PT helping to order a garment through [facility]    Personal Factors and Comorbidities  Comorbidity 1    Comorbidities  radiation history    Examination-Activity Limitations  Dressing;Reach Overhead;Carry    Stability/Clinical Decision Making  Stable/Uncomplicated    Clinical Decision Making  Low    Rehab Potential  Excellent    PT Frequency  1x / week    PT Duration  6 weeks    PT Treatment/Interventions  ADLs/Self Care Home Management;Manual techniques;Manual lymph drainage;Therapeutic exercise;Passive range of motion    PT Next Visit Plan  phone call check in with email update exercises vs telehealth advance to supine scap most likely, already has red band, need to order compression?    PT Home Exercise Plan  Access Code: [email or web address]     Consulted and Agree with Plan of Care  Patient       Patient will benefit from skilled therapeutic intervention in order to improve the following  deficits and impairments:  Decreased knowledge of precautions, Decreased range of motion, Decreased activity tolerance, Decreased scar mobility, Impaired UE functional use  Visit Diagnosis: Carcinoma of upper-outer quadrant of left breast in female, estrogen receptor positive (HCC)  Stiffness of left shoulder, not elsewhere classified  Disorder of the skin and subcutaneous tissue related to radiation, unspecified     Problem List Patient Active Problem List   Diagnosis Date Noted  . Carcinoma of upper-outer quadrant of left breast in female, estrogen receptor positive ([place]) 10/09/2017  . Plantar fasciitis 09/08/2017  . Foreign body in right foot 07/20/2017  . CAP (community acquired pneumonia) 02/23/2015  . Primary hyperparathyroidism ([place]) 02/21/2015  . Hyperlipidemia 02/05/2014  . Essential hypertension 02/05/2014  . Diabetes ([place]) 10/18/2013    Shan Levans, PT 05/02/2018, 1:26 PM  [place], [place], [address] Phone: [phone]   Fax:  [phone]  Name: LUVERNE FARONE MRN: [id number] Date of Birth: [birth year]

## 2018-05-04 ENCOUNTER — Ambulatory Visit: Payer: 59 | Admitting: Rehabilitation

## 2018-05-21 ENCOUNTER — Telehealth: Payer: 59 | Admitting: Family

## 2018-05-21 DIAGNOSIS — H5789 Other specified disorders of eye and adnexa: Secondary | ICD-10-CM

## 2018-05-21 NOTE — Progress Notes (Signed)
Thank you for the details you included in the comment boxes. Those details are very helpful in determining the best course of treatment for you and help [place] to provide the best care.  I would not recommend antibiotic eye drops just yet. It may just be something in your eye at this time. This is very common, in particular while sleeping, to get something small in your eye that causes irritation for a day or 2. If you notice any colored drainage or crusting when you wake up tomorrow, let us know. You can reply to this message. In the meantime, see plan below.   -------------------------------------------------------------------  We are sorry that you are not feeling well.  Here is how we plan to help!  Based on what you have shared with me it looks like you have conjunctivitis.  Conjunctivitis is a common inflammatory or infectious condition of the eye that is often referred to as "pink eye".  In most cases it is contagious (viral or bacterial). However, not all conjunctivitis requires antibiotics (ex. Allergic).  We have made appropriate suggestions for you based upon your presentation.  I recommend that you use OpconA, 1-2 drops every 4-6 hours (an over the counter allergy drop available at your local pharmacy).  Your pharmacist may have an alternative suggestion.  Pink eye can be highly contagious.  It is typically spread through direct contact with secretions, or contaminated objects or surfaces that one may have touched.  Strict handwashing is suggested with soap and water is urged.  If not available, use alcohol based had sanitizer.  Avoid unnecessary touching of the eye.  If you wear contact lenses, you will need to refrain from wearing them until you see no white discharge from the eye for at least 24 hours after being on medication.  You should see symptom improvement in 1-2 days after starting the medication regimen.  Call us if symptoms are not improved in 1-2 days.  Home Care:  Wash your  hands often!  Do not wear your contacts until you complete your treatment plan.  Avoid sharing towels, bed linen, personal items with a person who has pink eye.  See attention for anyone in your home with similar symptoms.  Get Help Right Away If:  Your symptoms do not improve.  You develop blurred or loss of vision.  Your symptoms worsen (increased discharge, pain or redness)  Your e-visit answers were reviewed by a board certified advanced clinical practitioner to complete your personal care plan.  Depending on the condition, your plan could have included both over the counter or prescription medications.  If there is a problem please reply  once you have received a response from your provider.  Your safety is important to [place].  If you have drug allergies check your prescription carefully.    You can use MyChart to ask questions about today's visit, request a non-urgent call back, or ask for a work or school excuse for 24 hours related to this e-Visit. If it has been greater than 24 hours you will need to follow up with your provider, or enter a new e-Visit to address those concerns.   You will get an e-mail in the next two days asking about your experience.  I hope that your e-visit has been valuable and will speed your recovery. Thank you for using e-visits.

## 2018-05-24 DIAGNOSIS — Z6833 Body mass index (BMI) 33.0-33.9, adult: Secondary | ICD-10-CM | POA: Diagnosis not present

## 2018-05-24 DIAGNOSIS — E109 Type 1 diabetes mellitus without complications: Secondary | ICD-10-CM | POA: Diagnosis not present

## 2018-05-24 DIAGNOSIS — C50919 Malignant neoplasm of unspecified site of unspecified female breast: Secondary | ICD-10-CM | POA: Diagnosis not present

## 2018-05-24 DIAGNOSIS — G894 Chronic pain syndrome: Secondary | ICD-10-CM | POA: Diagnosis not present

## 2018-05-24 DIAGNOSIS — M5136 Other intervertebral disc degeneration, lumbar region: Secondary | ICD-10-CM | POA: Diagnosis not present

## 2018-05-24 DIAGNOSIS — K219 Gastro-esophageal reflux disease without esophagitis: Secondary | ICD-10-CM | POA: Diagnosis not present

## 2018-05-24 DIAGNOSIS — E039 Hypothyroidism, unspecified: Secondary | ICD-10-CM | POA: Diagnosis not present

## 2018-05-24 MED FILL — PHENTERMINE 37.5 MG CAPSULE: 37.5 | 30 days supply | Qty: 30 | Fill #0

## 2018-06-02 DIAGNOSIS — E785 Hyperlipidemia, unspecified: Secondary | ICD-10-CM | POA: Diagnosis not present

## 2018-06-02 DIAGNOSIS — Z8639 Personal history of other endocrine, nutritional and metabolic disease: Secondary | ICD-10-CM | POA: Diagnosis not present

## 2018-06-02 DIAGNOSIS — E1165 Type 2 diabetes mellitus with hyperglycemia: Secondary | ICD-10-CM | POA: Diagnosis not present

## 2018-06-02 DIAGNOSIS — R946 Abnormal results of thyroid function studies: Secondary | ICD-10-CM | POA: Diagnosis not present

## 2018-06-02 DIAGNOSIS — E538 Deficiency of other specified B group vitamins: Secondary | ICD-10-CM | POA: Diagnosis not present

## 2018-06-07 ENCOUNTER — Other Ambulatory Visit: Payer: Self-pay | Admitting: Pharmacist

## 2018-06-13 MED FILL — HUMALOG 100 UNITS/ML KWIKPE: 100 | 90 days supply | Qty: 96 | Fill #0

## 2018-06-13 MED FILL — LANTUS SOLOSTAR 100 UNITS/M: 100 | 67 days supply | Qty: 54 | Fill #0

## 2018-06-16 DIAGNOSIS — L821 Other seborrheic keratosis: Secondary | ICD-10-CM | POA: Diagnosis not present

## 2018-06-16 DIAGNOSIS — D229 Melanocytic nevi, unspecified: Secondary | ICD-10-CM | POA: Diagnosis not present

## 2018-06-27 MED FILL — PHENTERMINE 37.5 MG TABLET: 37.5 | 30 days supply | Qty: 30 | Fill #0

## 2018-07-25 DIAGNOSIS — R946 Abnormal results of thyroid function studies: Secondary | ICD-10-CM | POA: Diagnosis not present

## 2018-07-25 DIAGNOSIS — Z8639 Personal history of other endocrine, nutritional and metabolic disease: Secondary | ICD-10-CM | POA: Diagnosis not present

## 2018-07-25 DIAGNOSIS — E785 Hyperlipidemia, unspecified: Secondary | ICD-10-CM | POA: Diagnosis not present

## 2018-07-25 DIAGNOSIS — E538 Deficiency of other specified B group vitamins: Secondary | ICD-10-CM | POA: Diagnosis not present

## 2018-07-25 DIAGNOSIS — E1165 Type 2 diabetes mellitus with hyperglycemia: Secondary | ICD-10-CM | POA: Diagnosis not present

## 2018-07-25 MED FILL — HUMULIN R 500 UNITS/ML KWIK: 500 | 83 days supply | Qty: 36 | Fill #0

## 2018-07-27 MED FILL — PHENTERMINE 37.5 MG TABLET: 37.5 | 30 days supply | Qty: 30 | Fill #0

## 2018-08-08 ENCOUNTER — Encounter: Payer: Self-pay | Admitting: Adult Health

## 2018-08-08 DIAGNOSIS — E109 Type 1 diabetes mellitus without complications: Secondary | ICD-10-CM | POA: Diagnosis not present

## 2018-08-08 DIAGNOSIS — M454 Ankylosing spondylitis of thoracic region: Secondary | ICD-10-CM | POA: Diagnosis not present

## 2018-08-08 DIAGNOSIS — E559 Vitamin D deficiency, unspecified: Secondary | ICD-10-CM | POA: Diagnosis not present

## 2018-08-08 DIAGNOSIS — E039 Hypothyroidism, unspecified: Secondary | ICD-10-CM | POA: Diagnosis not present

## 2018-08-08 DIAGNOSIS — M5136 Other intervertebral disc degeneration, lumbar region: Secondary | ICD-10-CM | POA: Diagnosis not present

## 2018-08-08 DIAGNOSIS — C50919 Malignant neoplasm of unspecified site of unspecified female breast: Secondary | ICD-10-CM | POA: Diagnosis not present

## 2018-08-21 ENCOUNTER — Encounter: Payer: Self-pay | Admitting: Family Medicine

## 2018-08-28 ENCOUNTER — Encounter: Payer: Self-pay | Admitting: Radiation Oncology

## 2018-08-28 ENCOUNTER — Other Ambulatory Visit: Payer: Self-pay

## 2018-08-28 ENCOUNTER — Ambulatory Visit
Admission: RE | Admit: 2018-08-28 | Discharge: 2018-08-28 | Disposition: A | Discharge status: Home / Self Care | Payer: 59 | Source: Ambulatory Visit | Attending: Radiation Oncology | Admitting: Radiation Oncology

## 2018-08-28 VITALS — BP 151/54 | HR 74 | Temp 99.1°F | Resp 18 | Ht 64.0 in | Wt 217.0 lb

## 2018-08-28 DIAGNOSIS — Z794 Long term (current) use of insulin: Secondary | ICD-10-CM | POA: Insufficient documentation

## 2018-08-28 DIAGNOSIS — C50412 Malignant neoplasm of upper-outer quadrant of left female breast: Secondary | ICD-10-CM | POA: Insufficient documentation

## 2018-08-28 DIAGNOSIS — R5383 Other fatigue: Secondary | ICD-10-CM | POA: Insufficient documentation

## 2018-08-28 DIAGNOSIS — R609 Edema, unspecified: Secondary | ICD-10-CM | POA: Insufficient documentation

## 2018-08-28 DIAGNOSIS — Z79811 Long term (current) use of aromatase inhibitors: Secondary | ICD-10-CM | POA: Diagnosis not present

## 2018-08-28 DIAGNOSIS — Z7982 Long term (current) use of aspirin: Secondary | ICD-10-CM | POA: Insufficient documentation

## 2018-08-28 DIAGNOSIS — Z170 Estrogen receptor positive status [ER+]: Secondary | ICD-10-CM | POA: Diagnosis not present

## 2018-08-28 DIAGNOSIS — Z79899 Other long term (current) drug therapy: Secondary | ICD-10-CM | POA: Insufficient documentation

## 2018-08-28 DIAGNOSIS — R232 Flushing: Secondary | ICD-10-CM | POA: Insufficient documentation

## 2018-08-28 DIAGNOSIS — Z08 Encounter for follow-up examination after completed treatment for malignant neoplasm: Secondary | ICD-10-CM | POA: Diagnosis not present

## 2018-08-28 DIAGNOSIS — Z923 Personal history of irradiation: Secondary | ICD-10-CM | POA: Diagnosis not present

## 2018-08-28 NOTE — Patient Instructions (Signed)
Coronavirus (COVID-19) Are you at risk?  Are you at risk for the Coronavirus (COVID-19)?  To be considered HIGH RISK for Coronavirus (COVID-19), you have to meet the following criteria:  . Traveled to [place], [place], [place], [place] or [place]; or in the [place] to [place], [place], [place], or [place]; and have fever, cough, and shortness of breath within the last 2 weeks of travel OR . Been in close contact with a person diagnosed with COVID-19 within the last 2 weeks and have fever, cough, and shortness of breath . IF YOU DO NOT MEET THESE CRITERIA, YOU ARE CONSIDERED LOW RISK FOR COVID-19.  What to do if you are HIGH RISK for COVID-19?  . If you are having a medical emergency, call 911. . Seek medical care right away. Before you go to a doctor's office, urgent care or emergency department, call ahead and tell them about your recent travel, contact with someone diagnosed with COVID-19, and your symptoms. You should receive instructions from your physician's office regarding next steps of care.  . When you arrive at healthcare provider, tell the healthcare staff immediately you have returned from visiting [place], [place], [place], [place] or [place]; or traveled in the [place] to [place], [place], [place], or [place]; in the last two weeks or you have been in close contact with a person diagnosed with COVID-19 in the last 2 weeks.   . Tell the health care staff about your symptoms: fever, cough and shortness of breath. . After you have been seen by a medical provider, you will be either: o Tested for (COVID-19) and discharged home on quarantine except to seek medical care if symptoms worsen, and asked to  - Stay home and avoid contact with others until you get your results (4-5 days)  - Avoid travel on public transportation if possible (such as bus, train, or airplane) or o Sent to the Emergency Department by EMS for evaluation, COVID-19 testing, and possible  admission depending on your condition and test results.  What to do if you are LOW RISK for COVID-19?  Reduce your risk of any infection by using the same precautions used for avoiding the common cold or flu:  . Wash your hands often with soap and warm water for at least 20 seconds.  If soap and water are not readily available, use an alcohol-based hand sanitizer with at least 60% alcohol.  . If coughing or sneezing, cover your mouth and nose by coughing or sneezing into the elbow areas of your shirt or coat, into a tissue or into your sleeve (not your hands). . Avoid shaking hands with others and consider head nods or verbal greetings only. . Avoid touching your eyes, nose, or mouth with unwashed hands.  . Avoid close contact with people who are sick. . Avoid places or events with large numbers of people in one location, like concerts or sporting events. . Carefully consider travel plans you have or are making. . If you are planning any travel outside or inside the [place], visit the CDC's Travelers' Health webpage for the latest health notices. . If you have some symptoms but not all symptoms, continue to monitor at home and seek medical attention if your symptoms worsen. . If you are having a medical emergency, call 911.   ADDITIONAL HEALTHCARE OPTIONS FOR PATIENTS  [facility] / e-Visit: [email or web address]         [facility]: [phone]  [facility]: [phone]                   [facility]: [phone]   

## 2018-08-28 NOTE — Progress Notes (Signed)
Pt reports that she has been put on a drug holiday from [place] by L. Causey due to severe swelling in feet and hands. Pt reports occasional hot flashes at night. Pt denies c/o pain in breast. Pt endorses using cocoa butter cream on breast. Breast is slightly hyperpigmented.   BP (!) 151/54 (BP Location: Right Arm, Patient Position: Sitting)   Pulse 74   Temp 99.1 F (37.3 C) (Temporal)   Resp 18   Ht 5\' 4"  (1.626 m)   Wt 217 lb (98.4 kg)   SpO2 (!) 8%   BMI 37.25 kg/m   Wt Readings from Last 3 Encounters:  08/28/18 217 lb (98.4 kg)  03/06/18 209 lb (94.8 kg)  02/27/18 209 lb (94.8 kg)   Loma Sousa, RN BSN

## 2018-08-28 NOTE — Progress Notes (Signed)
Radiation Oncology         [phone] ________________________________  Name: Alexis [name] MRN: [id number]  Date: 08/28/2018  DOB: 03-Oct-1955  Follow-Up Visit Note  CC: [name], DO (Inactive)  [name], MD    ICD-10-CM   1. Carcinoma of upper-outer quadrant of left breast in female, estrogen receptor positive ([place])  C50.412    Z17.0     Diagnosis:   invasive ductal breast cancer (pT2, pN0(sn), cM0, G2, ER+, PR+, HER2-) UOQ carcinoma of left breast   Interval Since Last Radiation:  7 months, 1 week 11/28/17 - 01/17/18: 1. Left breast / 50.4 Gy in 28 fractions 2. Lumpectomy cavity Boost / 12 Gy in 6 fractions  Narrative:  The patient returns today for routine follow-up. Per recent MyChart notes, the patient is currently off her anastrozole for two weeks to see if her extremity swelling will resolve.  She is scheduled for bilateral diagnostic mammogram on 09/29/2018.  On review of systems, she reports significant hot flashes at night which makes it hard for her to get a good night sleep. she denies any pain in her breast. She endorses using cocoa butter cream on her breast.           She did undergo physical therapy for her left shoulder mobility which is been helpful.  Her measurements did not show any significant swelling in her left arm compared to the right.  As above the patient has diffuse edema involving her extremities  On review of systems, she reports some lingering fatigue. she denies any other symptoms. Pertinent positives are listed and detailed within the above HPI.}                 ALLERGIES:  is allergic to canagliflozin; empagliflozin; metformin hcl; and parlodel [bromocriptine mesylate].  Meds: Current Outpatient Medications  Medication Sig Dispense Refill  . ACCU-CHEK FASTCLIX LANCETS MISC USE 3 TIMES A DAY AS DIRECTED  3  . ACCU-CHEK GUIDE test strip USE 3 TIMES A DAY AS DIRECTED  3  . aspirin 81 MG tablet Take 81 mg by mouth every other  day.     . Cholecalciferol (VITAMIN D PO) Take by mouth.    Marland Kitchen HUMULIN R U-500 KWIKPEN 500 UNIT/ML kwikpen 3 (three) times daily with meals. 100 units at breakfast, 70 units at lunch, 90 units at evening meal.    . phentermine (ADIPEX-P) 37.5 MG tablet Take 37.5 mg by mouth daily.  3  . UNIFINE PENTIPS 31G X 5 MM MISC USE TWICE A DAY AS DIRECTED  2  . anastrozole (ARIMIDEX) 1 MG tablet Take 1 tablet (1 mg total) by mouth daily. (Patient not taking: Reported on 08/28/2018) 90 tablet 3  . insulin glargine (LANTUS) 100 UNIT/ML injection Inject 60 Units into the skin at bedtime.     . insulin lispro (HUMALOG) 100 UNIT/ML injection Inject 28 Units into the skin 3 (three) times daily before meals.      No current facility-administered medications for this encounter.     Physical Findings: The patient is in no acute distress. Patient is alert and oriented.  height is '5\' 4"'$  (1.626 m) and weight is 217 lb (98.4 kg). Her temporal temperature is 99.1 F (37.3 C). Her blood pressure is 151/54 (abnormal) and her pulse is 74. Her respiration is 18 and oxygen saturation is 8% (abnormal). .  No significant changes. Lungs are clear to auscultation bilaterally. Heart has regular rate and rhythm. No palpable cervical, supraclavicular, or  axillary adenopathy. Abdomen soft, non-tender, normal bowel sounds. Right breast with no palpable mass, nipple discharge, or bleeding.  Left breast with she has continued hyperpigmentation changes.  There is some swelling noted in the lateral left breast with some induration consistent edema effects  but no dominant masses appreciated in the breast nipple discharge or bleeding    Lab Findings: Lab Results  Component Value Date   WBC 17.2 (H) 12/06/2016   HGB 16.5 (H) 12/06/2016   HCT 51.6 (H) 12/06/2016   MCV 87.6 12/06/2016   PLT 330 12/06/2016    Radiographic Findings: No results found.  Impression:  No evidence of recurrence on clinical exam.  Patient has diffuse  edema and is been placed on break from her anastrozole to see if this may be causing some of this issue. Plan:  Routine follow-up in 3 months.   ____________________________________   Blair Promise, PhD, MD   This document serves as a record of services personally performed by Gery Pray, MD. It was created on his behalf by Wilburn Mylar, a trained medical scribe. The creation of this record is based on the scribe's personal observations and the provider's statements to them. This document has been checked and approved by the attending provider.

## 2018-09-21 ENCOUNTER — Other Ambulatory Visit: Payer: Self-pay

## 2018-09-21 ENCOUNTER — Other Ambulatory Visit: Payer: Self-pay | Admitting: Radiation Oncology

## 2018-09-21 DIAGNOSIS — Z170 Estrogen receptor positive status [ER+]: Secondary | ICD-10-CM

## 2018-09-21 DIAGNOSIS — C50412 Malignant neoplasm of upper-outer quadrant of left female breast: Secondary | ICD-10-CM

## 2018-09-22 ENCOUNTER — Ambulatory Visit (INDEPENDENT_AMBULATORY_CARE_PROVIDER_SITE_OTHER): Payer: 59 | Admitting: Gynecology

## 2018-09-22 ENCOUNTER — Encounter: Payer: Self-pay | Admitting: Gynecology

## 2018-09-22 VITALS — BP 138/80 | Ht 64.0 in | Wt 218.0 lb

## 2018-09-22 DIAGNOSIS — Z01419 Encounter for gynecological examination (general) (routine) without abnormal findings: Secondary | ICD-10-CM | POA: Diagnosis not present

## 2018-09-22 DIAGNOSIS — R87622 Low grade squamous intraepithelial lesion on cytologic smear of vagina (LGSIL): Secondary | ICD-10-CM | POA: Diagnosis not present

## 2018-09-22 DIAGNOSIS — Z853 Personal history of malignant neoplasm of breast: Secondary | ICD-10-CM

## 2018-09-22 DIAGNOSIS — N952 Postmenopausal atrophic vaginitis: Secondary | ICD-10-CM

## 2018-09-22 NOTE — Patient Instructions (Signed)
Follow-up for Pap smear results.  Follow-up in 1 year for annual exam. 

## 2018-09-22 NOTE — Addendum Note (Signed)
Addended by: Lorine Bears on: 09/22/2018 02:19 PM   Modules accepted: Orders

## 2018-09-22 NOTE — Progress Notes (Signed)
    Alexis [name] 1955/05/31 [id number]        62 y.o.  [id number] for annual gynecologic exam.  Diagnosed with breast cancer last year status post lumpectomy with radiation.  Initially started on anastrozole but this was discontinued recently due to some side effects.  Past medical history,surgical history, problem list, medications, allergies, family history and social history were all reviewed and documented as reviewed in the EPIC chart.  ROS:  Performed with pertinent positives and negatives included in the history, assessment and plan.   Additional significant findings : None   Exam: Alexis [name] assistant Vitals:   09/22/18 1203  BP: 138/80  Weight: 218 lb (98.9 kg)  Height: 5\' 4"  (1.626 m)   Body mass index is 37.42 kg/m.  General appearance:  Normal affect, orientation and appearance. Skin: Grossly normal HEENT: Without gross lesions.  No cervical or supraclavicular adenopathy. Thyroid normal.  Lungs:  Clear without wheezing, rales or rhonchi Cardiac: RR, without RMG Abdominal:  Soft, nontender, without masses, guarding, rebound, organomegaly or hernia Breasts:  Examined lying and sitting.  Right without masses, retractions, discharge or axillary adenopathy.  Left status post lumpectomy with radiation skin changes.  No masses or axillary adenopathy Pelvic:  Ext, BUS, Vagina: With atrophic changes Pap smear done  Adnexa: Without masses or tenderness    Anus and perineum: Normal   Rectovaginal: Normal sphincter tone without palpated masses or tenderness.    Assessment/Plan:  63 y.o. [id number] female for annual gynecologic exam.  Status post LAVH 2004 for leiomyoma/menorrhagia  1. Postmenopausal.  Having some hot flushes since starting anastrozole.  Recently discontinued.  Will monitor for now. 2. Breast cancer diagnoses last year.  Status post lumpectomy and radiation.  Exam NED.  Continue to follow-up with oncology as arranged.  Has mammogram coming up. 3. History of  VAIN 1 on Pap smear last year.  Follow-up colposcopy was normal with no biopsies done.  Initially had VAIN 1 2014 on colposcopic biopsy.  Pap smear/HPV 2016 was negative.  Pap smear 2017 was negative.  Pap smear 2018 showed ASCUS.  Pap smear done today.  Possible need for colposcopy was discussed. 4. DEXA 2018 normal.  Will consider repeat next year if she goes back on anastrozole. 5. Colonoscopy 2011.  Recommend repeat colonoscopy next year at 10-year interval. 6. Health maintenance.  No routine lab work done as patient does this elsewhere.  Follow-up 1 year, sooner as needed.   Alexis Auerbach MD, 12:27 PM 09/22/2018

## 2018-09-25 ENCOUNTER — Ambulatory Visit: Payer: 59 | Attending: Radiation Oncology

## 2018-09-25 ENCOUNTER — Other Ambulatory Visit: Payer: Self-pay

## 2018-09-25 DIAGNOSIS — L599 Disorder of the skin and subcutaneous tissue related to radiation, unspecified: Secondary | ICD-10-CM | POA: Diagnosis not present

## 2018-09-25 DIAGNOSIS — M25612 Stiffness of left shoulder, not elsewhere classified: Secondary | ICD-10-CM | POA: Diagnosis not present

## 2018-09-25 DIAGNOSIS — Z170 Estrogen receptor positive status [ER+]: Secondary | ICD-10-CM | POA: Diagnosis not present

## 2018-09-25 DIAGNOSIS — M25611 Stiffness of right shoulder, not elsewhere classified: Secondary | ICD-10-CM | POA: Diagnosis not present

## 2018-09-25 DIAGNOSIS — C50412 Malignant neoplasm of upper-outer quadrant of left female breast: Secondary | ICD-10-CM | POA: Diagnosis not present

## 2018-09-25 LAB — PAP IG W/ RFLX HPV ASCU

## 2018-09-25 NOTE — Therapy (Addendum)
[place], [place], [address] Phone: [phone]   Fax:  [phone]  Physical Therapy Evaluation  Patient Details  Name: Alexis [name] MRN: [id number] Date of Birth: [birth year] Referring Provider (PT): Gery Pray MD   Encounter Date: 09/25/2018  PT End of Session - 09/25/18 1639    Visit Number  1    Number of Visits  5    Date for PT Re-Evaluation  10/30/18    PT Start Time  1500    PT Stop Time  1600    PT Time Calculation (min)  60 min    Activity Tolerance  Patient tolerated treatment well    Behavior During Therapy  [facility] for tasks assessed/performed       Past Medical History:  Diagnosis Date  . ASCUS of cervix with negative high risk HPV 09/2016  . DDD (degenerative disc disease), lumbar   . Diabetes mellitus without complication (HCC)    Type 2, IDDM  . Diverticulitis   . Diverticulitis   . Facial trauma    forehead,nose and upper lip secondary to fall   . Fall   . GERD without esophagitis   . History of bronchitis   . History of colon polyps   . History of kidney stones   . History of urinary tract infection   . Hyperparathyroidism ([place])   . Parathyroid tumor   . Plantar fasciitis   . PONV (postoperative nausea and vomiting)    emotional when awakens / cries  . Seasonal allergies   . 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 Vitamin D deficiency   . Yeast infection    history of     Past Surgical History:  Procedure Laterality Date  . ABDOMINAL HYSTERECTOMY    . APPENDECTOMY    . CARPAL TUNNEL RELEASE     right and left   . CESAREAN SECTION    . CHOLECYSTECTOMY    . COLON SURGERY    . colonscopy     . DILATION AND CURETTAGE OF UTERUS    . HYSTEROSCOPY  2003  . LAVH  2004   leiomyoma/irregular bleeding  . PARATHYROIDECTOMY N/A 02/21/2015   Procedure: RIGHT PARATHYROIDECTOMY;  Surgeon: [name], MD;  Location: [facility];  Service:  General;  Laterality: N/A;  . PARTIAL MASTECTOMY WITH AXILLARY SENTINEL LYMPH NODE BIOPSY Left 10/11/2017   Procedure: LEFT BREAST PARTIAL MASTECTOMY WITH AXILLARY SENTINEL LYMPH NODE BIOPSY;  Surgeon: [name], MD;  Location: [facility];  Service: General;  Laterality: Left;  . TUBAL LIGATION      There were no vitals filed for this visit.   Subjective Assessment - 09/25/18 1506    Subjective  Pt presents to physical therapy following partial masectomy on 10/11/17 due to a L IDC with 0/3 lymph nodes positive by Dr. Catalina Antigua ER/PR positive, HER2 negative stage IA breast cancer. She finished radiation on 01/13/18. She states that she came to physical therapy back in April when she started to notice swelling in her arms and leg. She states that in the last month she got some knee high compression socks that she has been wearing and are helping a lot with her swelling. Pt reports that she does get up every 30 min to walk due to she has a sedentary job but she has felt out of breath for the past few months when trying to walk. Previously she reports walking 1 mile but now  she walks about 10 min due to fatiuge and shortness of breathe.    Pertinent History  Pt has a history of colon removal with scarring on the abdomen.    Patient Stated Goals  I'm having difficulty with swelling and feeling out of breathe when exercising. I am experiencing pain in my back when I bend over.    Currently in Pain?  Yes    Pain Score  5     Pain Location  Back    Pain Descriptors / Indicators  Aching    Pain Type  Chronic pain    Pain Frequency  Occasional    Aggravating Factors   bending over         [facility] PT Assessment - 09/25/18 0001      Assessment   Medical Diagnosis  Lt breast cancer    Referring Provider (PT)  Gery Pray MD    Onset Date/Surgical Date  10/11/17    Hand Dominance  Right    Prior Therapy  yes      Precautions   Precaution Comments  Lymphedema, cancer      Restrictions    Weight Bearing Restrictions  No      Balance Screen   Has the patient fallen in the past 6 months  No    Has the patient had a decrease in activity level because of a fear of falling?   Yes    Is the patient reluctant to leave their home because of a fear of falling?   No      Home Film[place]video [place] residence    Living Arrangements  Children;Other relatives    Additional Comments  Lives with 5 grand kids      Prior Function   Level of Independence  Independent    Vocation  Full time employment    Leisure  Walking   Pt is unable to walk as much as she was previously.      Cognition   Overall Cognitive Status  Within Functional Limits for tasks assessed      Observation/Other Assessments   Observations  Incision over the R abdomen appears healed. Skin warm, dry and intact.       Sensation   Light Touch  Appears Intact      Coordination   Gross Motor Movements are Fluid and Coordinated  Yes      Posture/Postural Control   Posture/Postural Control  Postural limitations    Postural Limitations  Rounded Shoulders      ROM / Strength   AROM / PROM / Strength  AROM      AROM   Overall AROM Comments  Pulling in B axilla area    AROM Assessment Site  Shoulder    Right/Left Shoulder  Right;Left    Right Shoulder Flexion  122 Degrees    Right Shoulder ABduction  113 Degrees    Right Shoulder Internal Rotation  52 Degrees    Right Shoulder External Rotation  68 Degrees    Left Shoulder Flexion  128 Degrees    Left Shoulder ABduction  126 Degrees    Left Shoulder Internal Rotation  60 Degrees    Left Shoulder External Rotation  68 Degrees        LYMPHEDEMA/ONCOLOGY QUESTIONNAIRE - 09/25/18 1624      Type   Cancer Type  Lt breast      Surgeries   Lumpectomy Date  10/11/17   MD called it modified  mastectomy   Number Lymph Nodes Removed  3      Treatment   Active Chemotherapy Treatment  No    Past Chemotherapy Treatment  No    Active  Radiation Treatment  No    Past Radiation Treatment  Yes    Current Hormone Treatment  Yes    Drug Name  anastrozole      What other symptoms do you have   Are you Having Heaviness or Tightness  Yes    Are you having Pain  Yes      Lymphedema Assessments   Lymphedema Assessments  Upper extremities;Lower extremities      Right Upper Extremity Lymphedema   10 cm Proximal to Olecranon Process  35 cm    Olecranon Process  28 cm    15 cm Proximal to Ulnar Styloid Process  27.7 cm    10 cm Proximal to Ulnar Styloid Process  25.8 cm    Just Proximal to Ulnar Styloid Process  18.5 cm    Across Hand at [employer]  21.4 cm    At [place] of 2nd Digit  7.4 cm      Left Upper Extremity Lymphedema   15 cm Proximal to Olecranon Process  37 cm    10 cm Proximal to Olecranon Process  35.9 cm    Olecranon Process  29.5 cm    15 cm Proximal to Ulnar Styloid Process  28 cm    10 cm Proximal to Ulnar Styloid Process  26 cm    Just Proximal to Ulnar Styloid Process  19.5 cm    Across Hand at [employer]  21 cm    At [place] of 2nd Digit  7.2 cm    Other  36length    Other  knuckles 19.5    Other  finger length to first IP joint 4cm      Right Lower Extremity Lymphedema   30 cm Proximal to Floor at Lateral Plantar Foot  41 cm    20 cm Proximal to Floor at Lateral Plantar Foot  33 1    10 cm Proximal to Floor at Lateral Malleoli  25.2 cm    5 cm Proximal to 1st MTP Joint  23.5 cm    Across MTP Joint  23.5 cm    Around Proximal Great Toe  8.3 cm    Other  Around abdomen above iliac crest at navel = 121.9 cm      Left Lower Extremity Lymphedema   30 cm Proximal to Floor at Lateral Plantar Foot  43 cm    20 cm Proximal to Floor at Lateral Plantar Foot  35.3 cm    10 cm Proximal to Floor at Lateral Malleoli  26.3 cm    5 cm Proximal to 1st MTP Joint  23.2 cm    Across MTP Joint  23 cm    Around Proximal Great Toe  9.2 cm          Quick Dash - 09/25/18 0001    Open a tight or new jar   Mild difficulty    Do heavy household chores (wash walls, wash floors)  Mild difficulty    Carry a shopping bag or briefcase  No difficulty    Wash your back  Mild difficulty    Use a knife to cut food  Mild difficulty    Recreational activities in which you take some force or impact through your arm, shoulder, or hand (golf, hammering,  tennis)  Moderate difficulty    During the past week, to what extent has your arm, shoulder or hand problem interfered with your normal social activities with family, friends, neighbors, or groups?  Not at all    During the past week, to what extent has your arm, shoulder or hand problem limited your work or other regular daily activities  Not at all    Arm, shoulder, or hand pain.  Mild    Tingling (pins and needles) in your arm, shoulder, or hand  Mild    Difficulty Sleeping  Mild difficulty    DASH Score  20.45 %        Outpatient Rehab from 09/25/2018 in [place]  Lymphedema Life Impact Scale Total Score  33.82 %      Objective measurements completed on examination: See above findings.              PT Education - 09/25/18 1636    Education Details  Discussed systemic edema vs. lymphedema. Pt educated on the purpose of physical therapy at this time to address ROM of B shoulders and POC to include possible edema management following MD appt. pt educated that she will be referred to MD for edema and Dr. Sondra Come was notified.    Person(s) Educated  Patient    Methods  Explanation    Comprehension  Verbalized understanding       PT Short Term Goals - 09/25/18 1656      PT SHORT TERM GOAL #1   Title  Pt will demonstrate improved flexion/abduction in B shoulders by 10 degrees within 2 weeks in order to improve functional mobility.    Baseline  Flexion/Abd R 122/113, L 128/126    Time  2    Period  Weeks    Status  New    Target Date  10/16/18        PT Long Term Goals - 09/25/18 1700      PT LONG  TERM GOAL #1   Title  Pt will report no pulling in B axilla during B shoulder movements.    Baseline  Pt has pulling in B axilla with flexion/abduction    Time  4    Period  Weeks    Status  New    Target Date  10/23/18      PT LONG TERM GOAL #2   Title  Pt will be independent with HEP within 4 weeks in order to improve autonomy of care.    Baseline  pt does not currently perform HEP.    Time  4    Period  Weeks    Status  New    Target Date  10/23/18      PT LONG TERM GOAL #3   Title  Pt will score 10% or less on the quick DASH in order to demonstrate BUE functional mobility and 25% on the [facility] to demonstrate overall improvement of functional mobility related to edmea.    Time  4    Period  Weeks    Status  New    Target Date  10/23/18             Plan - 09/25/18 1640    Clinical Impression Statement  Pt presents to physical therapy following one previous physical therapy treatment in April. Pt has decreased AROM since that time in B shoulders but edema in BUE has remained about the same. Pt presents with overall edema in BLE/BUE and significant  edema noted in the abdomen with greater fluid retention in the L side of the abdomen when compared to R as evidenced by greated protrusion on the L. Pt skin is warm, dry and intact. Pt has knee high compression garments for her BLE which she purchased herself. Pt states that she has not yet purchased a compression garment for her LUE. Due to overall edema pt will benefit from skilled physical therapy services to addres B shoulder ROM limitations. Edema treatment will be deferred until MD appt.    Personal Factors and Comorbidities  Comorbidity 1    Comorbidities  radiation history    Examination-Activity Limitations  Dressing;Reach Overhead;Carry    Stability/Clinical Decision Making  Evolving/Moderate complexity    Clinical Decision Making  Moderate    Rehab Potential  Good    PT Frequency  1x / week    PT Duration  4 weeks    PT  Treatment/Interventions  ADLs/Self Care Home Management;Manual techniques;Manual lymph drainage;Therapeutic exercise;Passive range of motion;Neuromuscular re-education;Functional mobility training;Therapeutic activities;Energy conservation;Patient/family education;DME Instruction    PT Next Visit Plan  Add AROM stretching and postural exercises provide with pictures/instructions.    Consulted and Agree with Plan of Care  Patient       Patient will benefit from skilled therapeutic intervention in order to improve the following deficits and impairments:  Decreased knowledge of precautions, Decreased range of motion, Decreased activity tolerance, Decreased scar mobility, Impaired UE functional use, Decreased endurance  Visit Diagnosis: Carcinoma of upper-outer quadrant of left breast in female, estrogen receptor positive (HCC)  Disorder of the skin and subcutaneous tissue related to radiation, unspecified  Stiffness of left shoulder, not elsewhere classified  Stiffness of right shoulder, not elsewhere classified     Problem List Patient Active Problem List   Diagnosis Date Noted  . Carcinoma of upper-outer quadrant of left breast in female, estrogen receptor positive ([place]) 10/09/2017  . Plantar fasciitis 09/08/2017  . Foreign body in right foot 07/20/2017  . CAP (community acquired pneumonia) 02/23/2015  . Primary hyperparathyroidism ([place]) 02/21/2015  . Hyperlipidemia 02/05/2014  . Essential hypertension 02/05/2014  . Diabetes ([place]) 10/18/2013    Ander Purpura, PT 09/25/2018, 5:08 PM  [place], [place], [address] Phone: [phone]   Fax:  [phone]  Name: Alexis [name] MRN: [id number] Date of Birth: Jul 06, 1955

## 2018-09-27 ENCOUNTER — Telehealth: Payer: Self-pay

## 2018-09-29 ENCOUNTER — Other Ambulatory Visit: Payer: Self-pay

## 2018-09-29 ENCOUNTER — Ambulatory Visit
Admission: RE | Admit: 2018-09-29 | Discharge: 2018-09-29 | Disposition: A | Discharge status: Home / Self Care | Payer: 59 | Source: Ambulatory Visit | Attending: Adult Health | Admitting: Adult Health

## 2018-09-29 DIAGNOSIS — R922 Inconclusive mammogram: Secondary | ICD-10-CM | POA: Diagnosis not present

## 2018-09-29 DIAGNOSIS — Z170 Estrogen receptor positive status [ER+]: Secondary | ICD-10-CM

## 2018-09-29 DIAGNOSIS — C50412 Malignant neoplasm of upper-outer quadrant of left female breast: Secondary | ICD-10-CM

## 2018-09-29 HISTORY — DX: Malignant (primary) neoplasm, unspecified: C80.1

## 2018-10-02 ENCOUNTER — Other Ambulatory Visit: Payer: Self-pay

## 2018-10-02 ENCOUNTER — Ambulatory Visit: Payer: 59

## 2018-10-02 DIAGNOSIS — M25611 Stiffness of right shoulder, not elsewhere classified: Secondary | ICD-10-CM | POA: Diagnosis not present

## 2018-10-02 DIAGNOSIS — M25612 Stiffness of left shoulder, not elsewhere classified: Secondary | ICD-10-CM

## 2018-10-02 DIAGNOSIS — Z170 Estrogen receptor positive status [ER+]: Secondary | ICD-10-CM | POA: Diagnosis not present

## 2018-10-02 DIAGNOSIS — L599 Disorder of the skin and subcutaneous tissue related to radiation, unspecified: Secondary | ICD-10-CM

## 2018-10-02 DIAGNOSIS — C50412 Malignant neoplasm of upper-outer quadrant of left female breast: Secondary | ICD-10-CM | POA: Diagnosis not present

## 2018-10-02 NOTE — Patient Instructions (Signed)
Scapular Retraction (Standing)    With arms at sides, pinch shoulder blades together. You can do this seated with hands resting in lap. Pull shoulder blades down and back.  Repeat _10_ times per set. Do _1___ sets per session. Do _5__ sessions per day.  http://orth.exer.us/945   Copyright  VHI. All rights reserved.  Shoulder Retraction / External Rotation With Band    With band held in front, keep elbows at side while rotating hands apart and pulling shoulder blades back. . Repeat _10_ times. Do __2__ sessions per day.  Copyright  VHI. All rights reserved.  SHOULDER: Flexion - Supine (Cane)    Hold cane in both hands. Raise arms up overhead. Do not allow back to arch. Hold _5_ seconds. _10__ reps per set, __1-2_ sets per day, _5__ days per week. Just get light stretch and avoid pain in the top of the shoulder.    Copyright  VHI. All rights reserved.  SHOULDER: External Rotation - Supine (Cane)    Hold cane with both hands. Rotate arm away from body. Keep elbow on floor and next to body. _10__ reps per set, _1-2__ sets per day, _5__ days per week Add towel to keep elbow at side. You should feel a stretch in the front of your shoulder and chest. Feel a light stretch and hold for about 5 seconds. The palm on the side you are stretching should be up toward your face. Remember to keep your elbow tucked in and your elbow bent. Use a towel to remind your self to keep your elbow tucked.   Copyright  VHI. All rights reserved.

## 2018-10-02 NOTE — Therapy (Signed)
[place], [place], [address] Phone: [phone]   Fax:  [phone]  Physical Therapy Treatment  Patient Details  Name: Alexis [name] MRN: [id number] Date of Birth: 18-Jun-1955 Referring Provider (PT): Gery Pray MD   Encounter Date: 10/02/2018  PT End of Session - 10/02/18 0904    Visit Number  2    Number of Visits  5    Date for PT Re-Evaluation  10/30/18    PT Start Time  0800    PT Stop Time  0855    PT Time Calculation (min)  55 min    Activity Tolerance  Patient tolerated treatment well    Behavior During Therapy  [facility] for tasks assessed/performed       Past Medical History:  Diagnosis Date  . ASCUS of cervix with negative high risk HPV 09/2016  . Breast cancer ([place]) 2019   Left Breast Lumpectomy   . Cancer ([place])   . DDD (degenerative disc disease), lumbar   . Diabetes mellitus without complication (HCC)    Type 2, IDDM  . Diverticulitis   . Diverticulitis   . Facial trauma    forehead,nose and upper lip secondary to fall   . Fall   . GERD without esophagitis   . History of bronchitis   . History of colon polyps   . History of kidney stones   . History of urinary tract infection   . Hyperparathyroidism ([place])   . Parathyroid tumor   . Personal history of radiation therapy 2019   Right Breast Cancer  . Plantar fasciitis   . PONV (postoperative nausea and vomiting)    emotional when awakens / cries  . Seasonal allergies   . 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 Vitamin D deficiency   . Yeast infection    history of     Past Surgical History:  Procedure Laterality Date  . ABDOMINAL HYSTERECTOMY    . APPENDECTOMY    . BREAST LUMPECTOMY Left 10/11/2017  . CARPAL TUNNEL RELEASE     right and left   . CESAREAN SECTION    . CHOLECYSTECTOMY    . COLON SURGERY    . colonscopy     . DILATION AND CURETTAGE OF UTERUS    . HYSTEROSCOPY   2003  . LAVH  2004   leiomyoma/irregular bleeding  . PARATHYROIDECTOMY N/A 02/21/2015   Procedure: RIGHT PARATHYROIDECTOMY;  Surgeon: [name], MD;  Location[facility];  Service: General;  Laterality: N/A;  . PARTIAL MASTECTOMY WITH AXILLARY SENTINEL LYMPH NODE BIOPSY Left 10/11/2017   Procedure: LEFT BREAST PARTIAL MASTECTOMY WITH AXILLARY SENTINEL LYMPH NODE BIOPSY;  Surgeon: [name], MD;  Location: [facility];  Service: General;  Laterality: Left;  . TUBAL LIGATION      There were no vitals filed for this visit.  Subjective Assessment - 10/02/18 0807    Subjective  Pt states that she found some fluid pills that she had been prescribed by her MD 1 year ago. She called her MD who prescribed her DM medication and was told it was okay to try one. She took it yesterday and stated that she had to urinate a lot yesterday. pt states it was easier to don her compression socks this morning. Discussed the importance of not taking medication unless discussing with her MD prior. Pt reports that she got her mammogram on Friday and was told there were no concerns. But  she might need to get an MRI every year due to the placement of her scar tissue.    Pertinent History  Hx of colon removal w/scarring on the abdomen, partial masectomy on 10/11/17 L IDC with 0/3 lymph nodes positive, ER/PR positive, HER2 negative stag IA breaset cancer. Radiation.    Limitations  Lifting    Patient Stated Goals  I'm having difficulty with swelling and feeling out of breathe when exercising. I am experiencing pain in my back when I bend over.    Currently in Pain?  Yes    Pain Score  5     Pain Location  Shoulder   LBP 5/10   Pain Orientation  Right    Pain Descriptors / Indicators  Aching    Pain Type  Chronic pain    Pain Onset  1 to 4 weeks ago    Pain Frequency  Occasional    Aggravating Factors   bending over    Pain Relieving Factors  rest                  Outpatient Rehab from  [facility]/25/2018 [facility]  Lymphedema Life Impact Scale Total Score  33.82 %           [facility] Adult PT Treatment/Exercise - 10/02/18 0001      Exercises   Exercises  Shoulder      Shoulder Exercises: Seated   Retraction  AROM    Retraction Limitations  Pt requires VC for down/in movement to prevent shoulder hiking bilaterally due to over use of the upper trapezius bilaterally     External Rotation  Strengthening;Both;20 reps    Theraband Level (Shoulder External Rotation)  Level 1 (Yellow)    External Rotation Limitations  2 sets of 10 pt requires VC for to avoid compensation w/elbow extension      Shoulder Exercises: Pulleys   Flexion  2 minutes    Flexion Limitations  VC to prevent shoulder hiking    ABduction  2 minutes    ABduction Limitations  VC to prevent shoulder hiking      Shoulder Exercises: ROM/Strengthening   Other ROM/Strengthening Exercises  Supind dowel exercises into flexion, ER 10x ~5 second hold in supine with VC for easy stretch at end range while avoiding pain in the anterior shoulder where symptoms are usually present.       Manual Therapy   Manual Therapy  Passive ROM    Passive ROM  Bilateral shoulder in all directions. Pt needs on assistance w/internal rotation. Pt has limitations in abduction/flexion and ER. she has difficulty relaxing and needs cueing/oscillations to achieve end range. Firm end feel w/light stretch avoiding pain.              PT Education - 10/02/18 0901    Education Details  Pt was provided w/updated HEP. Discussed light stretching while avoiding symptoms. Pt was educated on correct technique for exercises and discussed the anatomy of the scapula in relation to the shoulder.    Person(s) Educated  Patient    Methods  Explanation;Demonstration;Verbal cues;Handout    Comprehension  Verbalized understanding;Returned demonstration       PT Short Term Goals - 09/25/18 1656      PT SHORT TERM  GOAL #1   Title  Pt will demonstrate improved flexion/abduction in B shoulders by 10 degrees within 2 weeks in order to improve functional mobility.    Baseline  Flexion/Abd R 122/113, L 128/126  Time  2    Period  Weeks    Status  New    Target Date  10/16/18        PT Long Term Goals - 09/25/18 1700      PT LONG TERM GOAL #1   Title  Pt will report no pulling in B axilla during B shoulder movements.    Baseline  Pt has pulling in B axilla with flexion/abduction    Time  4    Period  Weeks    Status  New    Target Date  10/23/18      PT LONG TERM GOAL #2   Title  Pt will be independent with HEP within 4 weeks in order to improve autonomy of care.    Baseline  pt does not currently perform HEP.    Time  4    Period  Weeks    Status  New    Target Date  10/23/18      PT LONG TERM GOAL #3   Title  Pt will score 10% or less on the quick DASH in order to demonstrate BUE functional mobility and 84% on the [facility] to demonstrate overall improvement of functional mobility related to edmea.    Time  4    Period  Weeks    Status  New    Target Date  10/23/18            Plan - 10/02/18 1005    Clinical Impression Statement  Pt presents to physcal therapy today with continued systemic edema. She continues with stiffness in B shoulders. Pt was able to progress ROM activities this session w/o an increase in pain from baseline. She did required VC throughout ther-ex to prevent over compensation w/elbow extension, shoulder abduction and shoulder hiking. Pt HEP was updated in order to address deficits. PROM performed in all directions w/o an increase in pain. Pt has greatest ROM deficits in flexion and abduction. Continue with current POC.    Comorbidities  radiation history    Examination-Activity Limitations  Dressing;Reach Overhead;Carry    PT Frequency  1x / week    PT Duration  4 weeks    PT Treatment/Interventions  ADLs/Self Care Home Management;Manual techniques;Manual lymph  drainage;Therapeutic exercise;Passive range of motion;Neuromuscular re-education;Functional mobility training;Therapeutic activities;Energy conservation;Patient/family education;DME Instruction    PT Next Visit Plan  Progress AROM stretching and postural exercises.    PT Home Exercise Plan  Access Code: [email or web address]     Consulted and Agree with Plan of Care  Patient       Patient will benefit from skilled therapeutic intervention in order to improve the following deficits and impairments:  Decreased knowledge of precautions, Decreased range of motion, Decreased activity tolerance, Decreased scar mobility, Impaired UE functional use, Decreased endurance  Visit Diagnosis: Carcinoma of upper-outer quadrant of left breast in female, estrogen receptor positive (HCC)  Disorder of the skin and subcutaneous tissue related to radiation, unspecified  Stiffness of left shoulder, not elsewhere classified  Stiffness of right shoulder, not elsewhere classified     Problem List Patient Active Problem List   Diagnosis Date Noted  . Carcinoma of upper-outer quadrant of left breast in female, estrogen receptor positive ([place]) 10/09/2017  . Plantar fasciitis 09/08/2017  . Foreign body in right foot 07/20/2017  . CAP (community acquired pneumonia) 02/23/2015  . Primary hyperparathyroidism ([place]) 02/21/2015  . Hyperlipidemia 02/05/2014  . Essential hypertension 02/05/2014  . Diabetes ([place]) 10/18/2013    Ander Purpura,  PT 10/02/2018, 10:13 AM  [place], [place], [address] Phone: [phone]   Fax:  [phone]  Name: Alexis [name] MRN: [id number] Date of Birth: December 13, 1955

## 2018-10-03 ENCOUNTER — Other Ambulatory Visit: Payer: Self-pay | Admitting: Adult Health

## 2018-10-03 DIAGNOSIS — C50412 Malignant neoplasm of upper-outer quadrant of left female breast: Secondary | ICD-10-CM

## 2018-10-03 DIAGNOSIS — Z170 Estrogen receptor positive status [ER+]: Secondary | ICD-10-CM

## 2018-10-03 NOTE — Progress Notes (Signed)
Per Mammogram results on 09/29/2018 a breast MRI is recommended due to inability to visualize the patient lumpectomy bed entirely, and dense breast tissue.  Orders placed.  I called and spoke with Joycelyn Schmid.  She is in agreement.  She notes she is going to see a  New PCP on 10/20/2018, will see her surgeon in 10/2018 and will se [place] in 04/2019.  Schedule message sent.    Wilber Bihari, NP

## 2018-10-05 ENCOUNTER — Telehealth: Payer: Self-pay | Admitting: Hematology and Oncology

## 2018-10-05 NOTE — Telephone Encounter (Signed)
Scheduled appt per 9/22 sch message - pt aware of appt date and time   

## 2018-10-09 ENCOUNTER — Ambulatory Visit: Payer: 59

## 2018-10-09 ENCOUNTER — Other Ambulatory Visit: Payer: Self-pay

## 2018-10-09 DIAGNOSIS — L599 Disorder of the skin and subcutaneous tissue related to radiation, unspecified: Secondary | ICD-10-CM

## 2018-10-09 DIAGNOSIS — C50412 Malignant neoplasm of upper-outer quadrant of left female breast: Secondary | ICD-10-CM | POA: Diagnosis not present

## 2018-10-09 DIAGNOSIS — Z170 Estrogen receptor positive status [ER+]: Secondary | ICD-10-CM

## 2018-10-09 DIAGNOSIS — M25611 Stiffness of right shoulder, not elsewhere classified: Secondary | ICD-10-CM

## 2018-10-09 DIAGNOSIS — M25612 Stiffness of left shoulder, not elsewhere classified: Secondary | ICD-10-CM

## 2018-10-09 NOTE — Therapy (Signed)
[place], [place], [address] Phone: [phone]   Fax:  [phone]  Physical Therapy Treatment  Patient Details  Name: Alexis [name] MRN: [id number] Date of Birth: Nov 25, 1955 Referring Provider (PT): Gery Pray MD   Encounter Date: 10/09/2018  PT End of Session - 10/09/18 0820    Visit Number  3    Number of Visits  5    Date for PT Re-Evaluation  10/30/18    PT Start Time  0805    PT Stop Time  0905    PT Time Calculation (min)  60 min    Activity Tolerance  Patient tolerated treatment well    Behavior During Therapy  [facility] for tasks assessed/performed       Past Medical History:  Diagnosis Date  . ASCUS of cervix with negative high risk HPV 09/2016  . Breast cancer ([place]) 2019   Left Breast Lumpectomy   . Cancer ([place])   . DDD (degenerative disc disease), lumbar   . Diabetes mellitus without complication (HCC)    Type 2, IDDM  . Diverticulitis   . Diverticulitis   . Facial trauma    forehead,nose and upper lip secondary to fall   . Fall   . GERD without esophagitis   . History of bronchitis   . History of colon polyps   . History of kidney stones   . History of urinary tract infection   . Hyperparathyroidism ([place])   . Parathyroid tumor   . Personal history of radiation therapy 2019   Right Breast Cancer  . Plantar fasciitis   . PONV (postoperative nausea and vomiting)    emotional when awakens / cries  . Seasonal allergies   . 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 Vitamin D deficiency   . Yeast infection    history of     Past Surgical History:  Procedure Laterality Date  . ABDOMINAL HYSTERECTOMY    . APPENDECTOMY    . BREAST LUMPECTOMY Left 10/11/2017  . CARPAL TUNNEL RELEASE     right and left   . CESAREAN SECTION    . CHOLECYSTECTOMY    . COLON SURGERY    . colonscopy     . DILATION AND CURETTAGE OF UTERUS    . HYSTEROSCOPY   2003  . LAVH  2004   leiomyoma/irregular bleeding  . PARATHYROIDECTOMY N/A 02/21/2015   Procedure: RIGHT PARATHYROIDECTOMY;  Surgeon: [name], MD;  [facility];  Service: General;  Laterality: N/A;  . PARTIAL MASTECTOMY WITH AXILLARY SENTINEL LYMPH NODE BIOPSY Left 10/11/2017   Procedure: LEFT BREAST PARTIAL MASTECTOMY WITH AXILLARY SENTINEL LYMPH NODE BIOPSY;  Surgeon: [name], MD;  Location: [facility];  Service: General;  Laterality: Left;  . TUBAL LIGATION      There were no vitals filed for this visit.  Subjective Assessment - 10/09/18 0815    Subjective  Pt reports that she wasn't able to do her exercises as much as she had due to some pain in her R shoulder which she has experienced on and off for a while. She states that she just didn't do the band exercise but did the rest of her exercises without difficulty.    Pertinent History  Hx of colon removal w/scarring on the abdomen, partial masectomy on 10/11/17 L IDC with 0/3 lymph nodes positive, ER/PR positive, HER2 negative stag IA breaset cancer. Radiation.    Limitations  Lifting  Patient Stated Goals  I'm having difficulty with swelling and feeling out of breathe when exercising. I am experiencing pain in my back when I bend over.    Currently in Pain?  Yes    Pain Score  1     Pain Location  Shoulder    Pain Orientation  Right    Pain Descriptors / Indicators  Aching    Pain Type  Chronic pain    Pain Onset  1 to 4 weeks ago    Pain Frequency  Intermittent    Aggravating Factors   Lifting her arm.    Pain Relieving Factors  rest                  Outpatient Rehab from 09/25/2018 in [place]  Lymphedema Life Impact Scale Total Score  33.82 %           [facility] Adult PT Treatment/Exercise - 10/09/18 0001      Exercises   Exercises  Shoulder      Shoulder Exercises: Supine   External Rotation  Strengthening;10 reps;Theraband    Theraband Level  (Shoulder External Rotation)  Level 1 (Yellow)    External Rotation Limitations  10x VC and tactile cueing to keep elbows tucked at the side in order to decrease compensation with abduction bilaterally.     Flexion  Strengthening;Both;10 reps;Theraband    Theraband Level (Shoulder Flexion)  Level 1 (Yellow)    Shoulder Flexion Weight (lbs)  10x VC for maintaining tension throughout the entire movement.     ABduction  Strengthening;Both;10 reps;Theraband    Theraband Level (Shoulder ABduction)  Level 1 (Yellow)    ABduction Limitations  Horizontal abd in supine 10x. Pt requires VC for set up. Good movement throughout without compensation.       Shoulder Exercises: Seated   Retraction  AROM    Retraction Limitations  Pt  continues to require occasional VC to decrease B shoulder elevation.       Shoulder Exercises: Prone   Retraction  Strengthening;Both;10 reps    Theraband Level (Shoulder Retraction)  Level 1 (Yellow)    Retraction Limitations  10x, VC for form and minimal VC for decreasing B shoulder hiking.       Shoulder Exercises: ROM/Strengthening   Other ROM/Strengthening Exercises  Supind dowel exercises into flexion, ER 10x ~5 second hold in supine with VC for easy stretch at end range while avoiding pain in the anterior shoulder where symptoms are usually present.       Manual Therapy   Manual Therapy  Passive ROM    Passive ROM  Bilateral shoulder PROM Flexion, Abduction, ER with over pressure. Oscillations and distractions to get into greater end range w/o an increase in symptoms.              PT Education - 10/09/18 0844    Education Details  Access Code: [email or web address] strengthening exercises were updated this session w/o an increase in pain from baseline. Pt was educated to stop the seated ER exercise at this time and start performing in supine.    Person(s) Educated  Patient    Methods  Explanation;Demonstration;Tactile cues;Verbal cues;Handout    Comprehension  Verbalized  understanding;Returned demonstration       PT Short Term Goals - 09/25/18 1656      PT SHORT TERM GOAL #1   Title  Pt will demonstrate improved flexion/abduction in B shoulders by 10 degrees within 2 weeks in order to improve functional  mobility.    Baseline  Flexion/Abd R 122/113, L 128/126    Time  2    Period  Weeks    Status  New    Target Date  10/16/18        PT Long Term Goals - 09/25/18 1700      PT LONG TERM GOAL #1   Title  Pt will report no pulling in B axilla during B shoulder movements.    Baseline  Pt has pulling in B axilla with flexion/abduction    Time  4    Period  Weeks    Status  New    Target Date  10/23/18      PT LONG TERM GOAL #2   Title  Pt will be independent with HEP within 4 weeks in order to improve autonomy of care.    Baseline  pt does not currently perform HEP.    Time  4    Period  Weeks    Status  New    Target Date  10/23/18      PT LONG TERM GOAL #3   Title  Pt will score 10% or less on the quick DASH in order to demonstrate BUE functional mobility and 16% on the [facility] to demonstrate overall improvement of functional mobility related to edmea.    Time  4    Period  Weeks    Status  New    Target Date  10/23/18            Plan - 10/09/18 0820    Clinical Impression Statement  Pt presents to physical therapy today with continued systemic edema that does not seem to be getting any worse since her initial evaluation. She continues to wear her compression socks on her BLE. Pt was able to tolerate an increase in stength resistant exercises this session w/o an increase in pain from baseline using a yellow theraband which she was provided with at her last session. Pt requires occasional VC to prevent compensation w/shoulder abduction and shoulder elevation. Pt will benefit from continued POC.    Personal Factors and Comorbidities  Comorbidity 1    Comorbidities  radiation history    Examination-Activity Limitations  Dressing;Reach  Overhead;Carry    Stability/Clinical Decision Making  Evolving/Moderate complexity    PT Frequency  1x / week    PT Duration  4 weeks    PT Treatment/Interventions  ADLs/Self Care Home Management;Manual techniques;Manual lymph drainage;Therapeutic exercise;Passive range of motion;Neuromuscular re-education;Functional mobility training;Therapeutic activities;Energy conservation;Patient/family education;DME Instruction    PT Next Visit Plan  Assess strengthening exercises. Progress if necessary into standing    PT Home Exercise Plan  Access Code: [email or web address]    Consulted and Agree with Plan of Care  Patient       Patient will benefit from skilled therapeutic intervention in order to improve the following deficits and impairments:  Decreased knowledge of precautions, Decreased range of motion, Decreased activity tolerance, Decreased scar mobility, Impaired UE functional use, Decreased endurance  Visit Diagnosis: Carcinoma of upper-outer quadrant of left breast in female, estrogen receptor positive (HCC)  Disorder of the skin and subcutaneous tissue related to radiation, unspecified  Stiffness of left shoulder, not elsewhere classified  Stiffness of right shoulder, not elsewhere classified     Problem List Patient Active Problem List   Diagnosis Date Noted  . Carcinoma of upper-outer quadrant of left breast in female, estrogen receptor positive ([place]) 10/09/2017  . Plantar fasciitis 09/08/2017  . Foreign body in  right foot 07/20/2017  . CAP (community acquired pneumonia) 02/23/2015  . Primary hyperparathyroidism ([place]) 02/21/2015  . Hyperlipidemia 02/05/2014  . Essential hypertension 02/05/2014  . Diabetes ([place]) 10/18/2013    Ander Purpura, PT 10/09/2018, 9:30 AM  [place], [place], [address] Phone: [phone]   Fax:  [phone]  Name: Alexis [name] MRN: [id number] Date of Birth:  [birth year]

## 2018-10-09 NOTE — Patient Instructions (Signed)
Access Code: [email or web address]  URL: [email or web address]  Date: 10/09/2018  Prepared by: Tomma Rakers   Exercises Supine Shoulder External Rotation in 45 Degrees Abduction AAROM with Dowel - 10 reps - 1 sets - 5 seconds hold - 1x daily - 7x weekly Supine Shoulder Flexion with Dowel - 10 reps - 1 sets - 5 seconds hold - 1x daily - 7x weekly Supine Shoulder Horizontal Abduction with Resistance - 10 reps - 1 sets - 1x daily - 7x weekly Hooklying Shoulder I - 10 reps - 1 sets - 1x daily - 7x weekly Supine Shoulder External Rotation with Resistance - 10 reps - 1 sets - 1x daily - 7x weekly Seated Scapular Retraction - 10 reps - 1 sets - 3 seconds hold - 1x daily - 7x weekly Standing Row with Resistance - 10 reps - 1 sets - 1x daily - 7x weekly

## 2018-10-10 ENCOUNTER — Encounter: Payer: Self-pay | Admitting: Gynecology

## 2018-10-18 ENCOUNTER — Other Ambulatory Visit: Payer: Self-pay

## 2018-10-18 ENCOUNTER — Ambulatory Visit: Payer: 59 | Attending: Radiation Oncology

## 2018-10-18 DIAGNOSIS — Z170 Estrogen receptor positive status [ER+]: Secondary | ICD-10-CM | POA: Diagnosis not present

## 2018-10-18 DIAGNOSIS — C50412 Malignant neoplasm of upper-outer quadrant of left female breast: Secondary | ICD-10-CM | POA: Diagnosis not present

## 2018-10-18 DIAGNOSIS — L599 Disorder of the skin and subcutaneous tissue related to radiation, unspecified: Secondary | ICD-10-CM | POA: Insufficient documentation

## 2018-10-18 DIAGNOSIS — M25611 Stiffness of right shoulder, not elsewhere classified: Secondary | ICD-10-CM | POA: Diagnosis not present

## 2018-10-18 DIAGNOSIS — M25612 Stiffness of left shoulder, not elsewhere classified: Secondary | ICD-10-CM | POA: Insufficient documentation

## 2018-10-18 NOTE — Therapy (Signed)
[place], [place], [address] Phone: [phone]   Fax:  [phone]  Physical Therapy Treatment  Patient Details  Name: Alexis [name] MRN: [id number] Date of Birth: Mar 26, 1955 Referring Provider (PT): Gery Pray MD   Encounter Date: 10/18/2018  PT End of Session - 10/18/18 0809    Visit Number  4    Number of Visits  5    Date for PT Re-Evaluation  10/30/18    PT Start Time  0800    PT Stop Time  0855    PT Time Calculation (min)  55 min    Activity Tolerance  Patient tolerated treatment well    Behavior During Therapy  [facility] for tasks assessed/performed       Past Medical History:  Diagnosis Date  . ASCUS of cervix with negative high risk HPV 09/2016  . Breast cancer ([place]) 2019   Left Breast Lumpectomy   . Cancer ([place])   . DDD (degenerative disc disease), lumbar   . Diabetes mellitus without complication (HCC)    Type 2, IDDM  . Diverticulitis   . Diverticulitis   . Facial trauma    forehead,nose and upper lip secondary to fall   . Fall   . GERD without esophagitis   . History of bronchitis   . History of colon polyps   . History of kidney stones   . History of urinary tract infection   . Hyperparathyroidism ([place])   . Parathyroid tumor   . Personal history of radiation therapy 2019   Right Breast Cancer  . Plantar fasciitis   . PONV (postoperative nausea and vomiting)    emotional when awakens / cries  . Seasonal allergies   . 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 Vitamin D deficiency   . Yeast infection    history of     Past Surgical History:  Procedure Laterality Date  . ABDOMINAL HYSTERECTOMY    . APPENDECTOMY    . BREAST LUMPECTOMY Left 10/11/2017  . CARPAL TUNNEL RELEASE     right and left   . CESAREAN SECTION    . CHOLECYSTECTOMY    . COLON SURGERY    . colonscopy     . DILATION AND CURETTAGE OF UTERUS    . HYSTEROSCOPY   2003  . LAVH  2004   leiomyoma/irregular bleeding  . PARATHYROIDECTOMY N/A 02/21/2015   Procedure: RIGHT PARATHYROIDECTOMY;  Surgeon: [name], MD;  Location: [facility];  Service: General;  Laterality: N/A;  . PARTIAL MASTECTOMY WITH AXILLARY SENTINEL LYMPH NODE BIOPSY Left 10/11/2017   Procedure: LEFT BREAST PARTIAL MASTECTOMY WITH AXILLARY SENTINEL LYMPH NODE BIOPSY;  Surgeon: [name], MD;  Location: [facility];  Service: General;  Laterality: Left;  . TUBAL LIGATION      There were no vitals filed for this visit.  Subjective Assessment - 10/18/18 0803    Subjective  Pt states that she is going to her primary care MD this week on Friday (10/20/18). She states that she has no pain today and felt good after her last session.    Pertinent History  Hx of colon removal w/scarring on the abdomen, partial masectomy on 10/11/17 L IDC with 0/3 lymph nodes positive, ER/PR positive, HER2 negative stag IA breaset cancer. Radiation.    Limitations  Lifting    Patient Stated Goals  I'm having difficulty with swelling and feeling out of breathe when exercising. I am experiencing  pain in my back when I bend over.    Currently in Pain?  Yes    Pain Score  0-No pain                  Outpatient Re[facility]25/2018 [facility]  Lymphedema Life Impact Scale Total Score  33.82 %           [facility] Adult PT Treatment/Exercise - 10/18/18 0001      Shoulder Exercises: Supine   Protraction  Strengthening;Both;10 reps    Protraction Weight (lbs)  2     Protraction Limitations  Supine B 2 sets, VC for movement w/demonstration. Pt was able to perform adequately; continued experiencing difficulty with movement throughout due to weakness and motor skills in this movement.      External Rotation  Strengthening;10 reps    External Rotation Weight (lbs)  2    External Rotation Limitations  2 sets B, side-lying, tactile cueing to prevent elbow  extension compensation and at side for tactile input to prevent abduction compensation    Flexion  Strengthening;Both;10 reps    Shoulder Flexion Weight (lbs)  2    Flexion Limitations  standing, 2 sets VC for correct direction of the thumb in order to prevent compensation w/the deltoid and adequately address shoulder flexors.     ABduction  Strengthening;Both;10 reps    Shoulder ABduction Weight (lbs)  2    ABduction Limitations  standing,  2 sets, abd in standing w/VC for direction of thumb in order to prevent impingement w/good return demonstration      Shoulder Exercises: Pulleys   Flexion  2 minutes    Flexion Limitations   VC for direction    ABduction  2 minutes    ABduction Limitations  VC for direction to get adequate abduction      Shoulder Exercises: ROM/Strengthening   Wall Wash  10x 5 second hold into flexion and abduction w/VC for easy end range stretch.       Manual Therapy   Manual Therapy  Passive ROM    Passive ROM  Bilateral shoulder PROM Flexion, Abduction, ER with over pressure. Oscillations and distractions to get into greater end range w/o an increase in symptoms.              PT Education - 10/18/18 0857    Education Details  Access code: [email or web address], Continue with current HEP at this time. Pt will go home and track her movement over the past month to see if ambulating every day has helped with mobility. Discussed next session including measurements that will be taken like on the first day.    Person(s) Educated  Patient    Methods  Explanation    Comprehension  Verbalized understanding       PT Short Term Goals - 09/25/18 1656      PT SHORT TERM GOAL #1   Title  Pt will demonstrate improved flexion/abduction in B shoulders by 10 degrees within 2 weeks in order to improve functional mobility.    Baseline  Flexion/Abd R 122/113, L 128/126    Time  2    Period  Weeks    Status  New    Target Date  10/16/18        PT Long Term Goals - 09/25/18 1700       PT LONG TERM GOAL #1   Title  Pt will report no pulling in B axilla during B shoulder movements.  Baseline  Pt has pulling in B axilla with flexion/abduction    Time  4    Period  Weeks    Status  New    Target Date  10/23/18      PT LONG TERM GOAL #2   Title  Pt will be independent with HEP within 4 weeks in order to improve autonomy of care.    Baseline  pt does not currently perform HEP.    Time  4    Period  Weeks    Status  New    Target Date  10/23/18      PT LONG TERM GOAL #3   Title  Pt will score 10% or less on the quick DASH in order to demonstrate BUE functional mobility and 92% on the [facility] to demonstrate overall improvement of functional mobility related to edmea.    Time  4    Period  Weeks    Status  New    Target Date  10/23/18            Plan - 10/18/18 0805    Clinical Impression Statement  Pt presents to physical therapy today with continued systemic edema that continues without noticable change. She continues with her compression socks. Pt continues to tolerate an increase in strengthening and ROM activities w/o an increase in pain from baseline and is reporting no pain at baseline. Pt continues with difficulty compensating with the UT due to poor motor control of the lower and middle trapezius which pt is now able to correct independently 50% of the time with occasional need for VC. Pt reports that she does her HEP at home. Pt is able to get to end range of motion with firm end feel, slight pop in the RUE into external rotation w/o pain and pt reports that she feels better and more "loose" following small pop. Discussed possibility of small cord popping in the R arm. Pt will benefit from continued current POC.    Personal Factors and Comorbidities  Comorbidity 1    Comorbidities  radiation history    Examination-Activity Limitations  Dressing;Reach Overhead;Carry    Rehab Potential  Good    PT Frequency  1x / week    PT Duration  4 weeks    PT  Treatment/Interventions  ADLs/Self Care Home Management;Manual techniques;Manual lymph drainage;Therapeutic exercise;Passive range of motion;Neuromuscular re-education;Functional mobility training;Therapeutic activities;Energy conservation;Patient/family education;DME Instruction    PT Next Visit Plan  Progress note and possible discharge. Assess pt movement from her phone. Update exercise sheet with more resistance exercises.    PT Home Exercise Plan  Access Code: [email or web address]    Consulted and Agree with Plan of Care  Patient       Patient will benefit from skilled therapeutic intervention in order to improve the following deficits and impairments:  Decreased knowledge of precautions, Decreased range of motion, Decreased activity tolerance, Decreased scar mobility, Impaired UE functional use, Decreased endurance  Visit Diagnosis: Carcinoma of upper-outer quadrant of left breast in female, estrogen receptor positive (HCC)  Disorder of the skin and subcutaneous tissue related to radiation, unspecified  Stiffness of left shoulder, not elsewhere classified  Stiffness of right shoulder, not elsewhere classified     Problem List Patient Active Problem List   Diagnosis Date Noted  . Carcinoma of upper-outer quadrant of left breast in female, estrogen receptor positive ([place]) 10/09/2017  . Plantar fasciitis 09/08/2017  . Foreign body in right foot 07/20/2017  . CAP (community acquired pneumonia) 02/23/2015  .  Primary hyperparathyroidism ([place]) 02/21/2015  . Hyperlipidemia 02/05/2014  . Essential hypertension 02/05/2014  . Diabetes ([place]) 10/18/2013    Ander Purpura, PT 10/18/2018, 9:00 AM  [place], [place], [address] Phone: [phone]   Fax:  [phone]  Name: Ileta Ofarrell MRN: [id number] Date of Birth: April 25, 1955

## 2018-10-19 ENCOUNTER — Other Ambulatory Visit: Payer: Self-pay

## 2018-10-20 ENCOUNTER — Ambulatory Visit: Payer: 59 | Admitting: Gynecology

## 2018-10-20 ENCOUNTER — Encounter: Payer: Self-pay | Admitting: Family Medicine

## 2018-10-20 ENCOUNTER — Ambulatory Visit: Payer: 59 | Admitting: Family Medicine

## 2018-10-20 ENCOUNTER — Ambulatory Visit (INDEPENDENT_AMBULATORY_CARE_PROVIDER_SITE_OTHER): Payer: 59

## 2018-10-20 ENCOUNTER — Encounter: Payer: Self-pay | Admitting: Gynecology

## 2018-10-20 VITALS — BP 124/80

## 2018-10-20 VITALS — BP 120/56 | HR 72 | Temp 98.7°F | Resp 20 | Ht 64.0 in | Wt 221.0 lb

## 2018-10-20 DIAGNOSIS — I890 Lymphedema, not elsewhere classified: Secondary | ICD-10-CM | POA: Diagnosis not present

## 2018-10-20 DIAGNOSIS — R0600 Dyspnea, unspecified: Secondary | ICD-10-CM | POA: Diagnosis not present

## 2018-10-20 DIAGNOSIS — E119 Type 2 diabetes mellitus without complications: Secondary | ICD-10-CM

## 2018-10-20 DIAGNOSIS — C50412 Malignant neoplasm of upper-outer quadrant of left female breast: Secondary | ICD-10-CM | POA: Diagnosis not present

## 2018-10-20 DIAGNOSIS — R0609 Other forms of dyspnea: Secondary | ICD-10-CM

## 2018-10-20 DIAGNOSIS — E210 Primary hyperparathyroidism: Secondary | ICD-10-CM | POA: Diagnosis not present

## 2018-10-20 DIAGNOSIS — E785 Hyperlipidemia, unspecified: Secondary | ICD-10-CM | POA: Diagnosis not present

## 2018-10-20 DIAGNOSIS — I10 Essential (primary) hypertension: Secondary | ICD-10-CM

## 2018-10-20 DIAGNOSIS — M7989 Other specified soft tissue disorders: Secondary | ICD-10-CM

## 2018-10-20 DIAGNOSIS — Z794 Long term (current) use of insulin: Secondary | ICD-10-CM

## 2018-10-20 DIAGNOSIS — C50919 Malignant neoplasm of unspecified site of unspecified female breast: Secondary | ICD-10-CM | POA: Diagnosis not present

## 2018-10-20 DIAGNOSIS — E1159 Type 2 diabetes mellitus with other circulatory complications: Secondary | ICD-10-CM

## 2018-10-20 DIAGNOSIS — N890 Mild vaginal dysplasia: Secondary | ICD-10-CM

## 2018-10-20 DIAGNOSIS — Z170 Estrogen receptor positive status [ER+]: Secondary | ICD-10-CM

## 2018-10-20 DIAGNOSIS — Z923 Personal history of irradiation: Secondary | ICD-10-CM | POA: Diagnosis not present

## 2018-10-20 DIAGNOSIS — I152 Hypertension secondary to endocrine disorders: Secondary | ICD-10-CM

## 2018-10-20 DIAGNOSIS — E1169 Type 2 diabetes mellitus with other specified complication: Secondary | ICD-10-CM

## 2018-10-20 NOTE — Progress Notes (Signed)
Subjective:  Patient ID: Alexis [name], female    DOB: Apr 02, 1955, 63 y.o.   MRN: [id number]  Patient Care Team: Baruch Gouty, FNP as PCP - General (Family Medicine) [name], MD as Consulting Physician (General Surgery) Nicholas Lose, MD as Consulting Physician (Hematology and Oncology) Gery Pray, MD as Consulting Physician (Radiation Oncology)   Chief Complaint:  [place] ([place] pt)   HPI: Alexis [name] is a 63 y.o. female presenting on 10/20/2018 for [place] ([place] pt)  Pt presents today to establish care. Pt was seeing Dr. Melina Copa who has retired. Pt is also followed by oncology, surgery, radiation oncology, and endocrinology.   1. Primary hyperparathyroidism ([place]) Had tumor removed from parathyroid. Was on repletion Synthroid but has since been discontinued due to normal TSH and T4 levels. This is managed by endocrinology. She denies current concerns.   2. Type 2 diabetes mellitus without complication, with long-term current use of insulin (HCC) Pt is followed by Dr. Buddy Duty with endocrinology. Last A1C 8.1 on 07/25/2018. Medication regimen was changed at this time and she has a follow up scheduled on 11/03/2018 for reevaluation. No polyuria, polydipsia, or polyuria.   3. Hypertension associated with type 2 diabetes mellitus ([place]) This has been diet controlled. She monitors the salt in her diet. She does not exercise on a regular basis. She tries to eat healthy. No headaches, dizziness, chest pain, or confusion. Pt does have bilateral lower extremity edema and bilateral upper extremity edema. This has been present since radiation therapy for breast cancer. Pt does have shortness of breath due to the swelling. No PND. Does have orthopnea.   4. Hyperlipidemia associated with type 2 diabetes mellitus ([place]) Compliant with medications - Yes Current medications - Crestor Side effects from medications - No Diet - generally healthy  Exercise - no  Family and personal medical history reviewed. Smoking and ETOH history reviewed.    5. Carcinoma of upper-outer quadrant of left breast in female, estrogen receptor positive ([place]) Treated with lumpectomy and 8 weeks or radiation therapy. Is currently on Arimidex therapy, will continue for 4-5 years. Followed by radiation oncology, surgery, and oncology.  6. Lymphedema Ongoing bilateral upper and lower extremity swelling and trunk swelling since completion of radiation therapy. Has been going to the lymphedema clinic for 3-4 weeks. Feels this has been minimally beneficial. She states the edema causes shortness of breath, especially with exertion.    Relevant past medical, surgical, family, and social history reviewed and updated as indicated.  Allergies and medications reviewed and updated. Date reviewed: Chart in Epic.   Past Medical History:  Diagnosis Date  . ASCUS of cervix with negative high risk HPV 09/2016  . Breast cancer ([place]) 2019   Left Breast Lumpectomy   . Cancer ([place])   . DDD (degenerative disc disease), lumbar   . Diabetes mellitus without complication (HCC)    Type 2, IDDM  . Diverticulitis   . Diverticulitis   . Facial trauma    forehead,nose and upper lip secondary to fall   . Fall   . GERD without esophagitis   . History of bronchitis   . History of colon polyps   . History of kidney stones   . History of urinary tract infection   . Hyperparathyroidism ([place])   . Parathyroid tumor   . Personal history of radiation therapy 2019   Right Breast Cancer  . Plantar fasciitis   . PONV (postoperative nausea and vomiting)  emotional when awakens / cries  . Seasonal allergies   . 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 Vitamin D deficiency   . Yeast infection    history of     Past Surgical History:  Procedure Laterality Date  . ABDOMINAL HYSTERECTOMY    . APPENDECTOMY    . BREAST LUMPECTOMY Left 10/11/2017  .  CARPAL TUNNEL RELEASE     right and left   . CESAREAN SECTION    . CHOLECYSTECTOMY    . COLON SURGERY    . colonscopy     . DILATION AND CURETTAGE OF UTERUS    . HYSTEROSCOPY  2003  . LAVH  2004   leiomyoma/irregular bleeding  . PARATHYROIDECTOMY N/A 02/21/2015   Procedure: RIGHT PARATHYROIDECTOMY;  Surgeon: [name], MD;  Location: [facility];  Service: General;  Laterality: N/A;  . PARTIAL MASTECTOMY WITH AXILLARY SENTINEL LYMPH NODE BIOPSY Left 10/11/2017   Procedure: LEFT BREAST PARTIAL MASTECTOMY WITH AXILLARY SENTINEL LYMPH NODE BIOPSY;  Surgeon: [name], MD;  Location: [facility];  Service: General;  Laterality: Left;  . TUBAL LIGATION      Social History   Socioeconomic History  . Marital status: Divorced    Spouse name: Not on file  . Number of children: 2  . Years of education: Not on file  . Highest education level: Not on file  Occupational History  . Not on file  Social Needs  . Financial resource strain: Not on file  . Food insecurity    Worry: Not on file    Inability: Not on file  . Transportation needs    Medical: No    Non-medical: No  Tobacco Use  . Smoking status: Never Smoker  . Smokeless tobacco: Never Used  Substance and Sexual Activity  . Alcohol use: No    Alcohol/week: 0.0 standard drinks  . Drug use: No  . Sexual activity: Yes    Birth control/protection: Surgical, None    Comment: 1st intercourse 45 yo-5 partners  Lifestyle  . Physical activity    Days per week: Not on file    Minutes per session: Not on file  . Stress: Not on file  Relationships  . Social Herbalist on phone: Not on file    Gets together: Not on file    Attends religious service: Not on file    Active member of club or organization: Not on file    Attends meetings of clubs or organizations: Not on file    Relationship status: Not on file  . Intimate partner violence    Fear of current or ex partner: No    Emotionally abused: No     Physically abused: No    Forced sexual activity: No  Other Topics Concern  . Not on file  Social History Narrative   Daughter and her 5 children live with pt     Outpatient Encounter Medications as of 10/20/2018  Medication Sig  . ACCU-CHEK FASTCLIX LANCETS MISC USE 3 TIMES A DAY AS DIRECTED  . ACCU-CHEK GUIDE test strip USE 3 TIMES A DAY AS DIRECTED  . anastrozole (ARIMIDEX) 1 MG tablet Take 0.1 mg by mouth daily.   Marland Kitchen aspirin 81 MG tablet Take 81 mg by mouth daily.   . Cholecalciferol (VITAMIN D PO) Take by mouth.  . Insulin Regular Human (HUMULIN R U-500 KWIKPEN [place]) Inject into the skin. Takes 100 am, 70 lunch, 90 dinner  .  rosuvastatin (CRESTOR) 5 MG tablet Take 5 mg by mouth daily.  Marland Kitchen UNIFINE PENTIPS 31G X 5 MM MISC USE TWICE A DAY AS DIRECTED  . [DISCONTINUED] insulin regular (NOVOLIN R) 100 units/mL injection Inject into the skin 3 (three) times daily before meals.   No facility-administered encounter medications on file as of 10/20/2018.     Allergies  Allergen Reactions  . Canagliflozin     Other reaction(s): yeast infection  . Empagliflozin     Other reaction(s): upset stomach  . Metformin Hcl     Other reaction(s): diarrhea  . Parlodel [Bromocriptine Mesylate] Hives    Review of Systems  Constitutional: Positive for fatigue and unexpected weight change. Negative for activity change, appetite change, chills, diaphoresis and fever.  HENT: Negative.   Eyes: Negative.  Negative for photophobia and visual disturbance.  Respiratory: Positive for shortness of breath. Negative for cough, chest tightness and wheezing.   Cardiovascular: Positive for leg swelling. Negative for chest pain and palpitations.  Gastrointestinal: Positive for abdominal distention. Negative for abdominal pain, anal bleeding, blood in stool, constipation, diarrhea, nausea, rectal pain and vomiting.  Endocrine: Negative.   Genitourinary: Negative for decreased urine volume, difficulty urinating,  dysuria, flank pain, frequency, hematuria and urgency.  Musculoskeletal: Negative for arthralgias, back pain and myalgias.  Skin: Negative.  Negative for color change, pallor and rash.  Allergic/Immunologic: Negative.   Neurological: Negative for dizziness, tremors, seizures, syncope, facial asymmetry, speech difficulty, weakness, light-headedness, numbness and headaches.  Hematological: Negative.  Does not bruise/bleed easily.  Psychiatric/Behavioral: Negative for confusion, hallucinations, sleep disturbance and suicidal ideas.  All other systems reviewed and are negative.       Objective:  BP (!) 120/56   Pulse 72   Temp 98.7 F (37.1 C)   Resp 20   Ht '5\' 4"'$  (6.606 m)   Wt 221 lb (100.2 kg)   SpO2 96%   BMI 37.93 kg/m    Wt Readings from Last 3 Encounters:  10/20/18 221 lb (100.2 kg)  09/22/18 218 lb (98.9 kg)  08/28/18 217 lb (98.4 kg)    Physical Exam Vitals signs and nursing note reviewed.  Constitutional:      General: She is not in acute distress.    Appearance: Normal appearance. She is well-developed and well-groomed. She is morbidly obese. She is not ill-appearing, toxic-appearing or diaphoretic.  HENT:     Head: Normocephalic and atraumatic.     Jaw: There is normal jaw occlusion.     Right Ear: Hearing normal.     Left Ear: Hearing normal.     Nose: Nose normal.     Mouth/Throat:     Lips: Pink.     Mouth: Mucous membranes are moist.     Pharynx: Oropharynx is clear. Uvula midline.  Eyes:     General: Lids are normal.     Extraocular Movements: Extraocular movements intact.     Conjunctiva/sclera: Conjunctivae normal.     Pupils: Pupils are equal, round, and reactive to light.  Neck:     Musculoskeletal: Normal range of motion and neck supple.     Thyroid: No thyroid mass, thyromegaly or thyroid tenderness.     Vascular: Normal carotid pulses. No carotid bruit or JVD.     Trachea: Trachea and phonation normal.  Cardiovascular:     Rate and Rhythm:  Normal rate and regular rhythm.     Chest Wall: PMI is not displaced.     Pulses: Normal pulses.  Heart sounds: Normal heart sounds. No murmur. No friction rub. No gallop. No S3 sounds.      Comments: Swelling to bilateral upper extremities, more pronounced in left hand and forearm. Pulmonary:     Effort: Pulmonary effort is normal. No respiratory distress.     Breath sounds: Normal breath sounds. No wheezing.  Chest:     Chest wall: Swelling present. No crepitus.    Abdominal:     General: A surgical scar is present. Bowel sounds are normal. There is distension. There is no abdominal bruit. There are no signs of injury.     Palpations: Abdomen is soft. There is no hepatomegaly, splenomegaly or mass.     Tenderness: There is no abdominal tenderness. There is no right CVA tenderness, left CVA tenderness, guarding or rebound.     Hernia: No hernia is present.  Musculoskeletal: Normal range of motion.     Right lower leg: 2+ Pitting Edema present.     Left lower leg: 2+ Pitting Edema present.  Lymphadenopathy:     Cervical: No cervical adenopathy.  Skin:    General: Skin is warm and dry.     Capillary Refill: Capillary refill takes less than 2 seconds.     Coloration: Skin is not cyanotic, jaundiced or pale.     Findings: No rash.  Neurological:     General: No focal deficit present.     Mental Status: She is alert and oriented to person, place, and time.     Cranial Nerves: Cranial nerves are intact. No cranial nerve deficit.     Sensory: Sensation is intact. No sensory deficit.     Motor: Motor function is intact. No weakness.     Coordination: Coordination is intact. Coordination normal.     Gait: Gait is intact. Gait normal.     Deep Tendon Reflexes: Reflexes are normal and symmetric. Reflexes normal.  Psychiatric:        Attention and Perception: Attention and perception normal.        Mood and Affect: Mood and affect normal.        Speech: Speech normal.        Behavior:  Behavior normal. Behavior is cooperative.        Thought Content: Thought content normal.        Cognition and Memory: Cognition and memory normal.        Judgment: Judgment normal.     Results for orders placed or performed in visit on 09/22/18  Pap IG w/ reflex to HPV when ASC-U  Result Value Ref Range   Clinical Information:     LMP:     PREV. PAP:     PREV. BX:     HPV DNA Probe-Source     STATEMENT OF ADEQUACY:     GENERAL CATEGORIZATION: (A)    INTERPRETATION/RESULT: (A)    Comment:     CYTOTECHNOLOGIST:     PATHOLOGIST:       X-Ray: Chest: Compared to 2018 chest xray, concerning for increased cardiac silhouette. No consolidation or infiltrates noted. Preliminary x-ray reading by Monia Pouch, FNP-C, [facility].  EKG: SR without acute changes, no ST elevation or ectopy. Compared to previous EKG, no significant changes. Monia Pouch, FNP-C.   Pertinent labs & imaging results that were available during my care of the patient were reviewed by me and considered in my medical decision making.  Assessment & Plan:  Alexis [name] was seen today for establish care.  Diagnoses and all orders  for this visit:  Primary hyperparathyroidism ([place]) Follower by endocrinology. Currently not on repletion therapy. Denies symptoms. Report any return or symptoms.   Type 2 diabetes mellitus without complication, with long-term current use of insulin ([place]) Followed by endocrinology. Has upcoming appointment. Has been doing well with recent regimen change.   Hypertension associated with type 2 diabetes mellitus ([place]) Continue diet and exercise as able. Report any persistent high or low readings.  Will initiate therapy if warranted.   Hyperlipidemia associated with type 2 diabetes mellitus ([place]) Diet encouraged - increase intake of fresh fruits and vegetables, increase intake of lean proteins. Bake, broil, or grill foods. Avoid fried, greasy, and fatty foods. Avoid fast foods. Increase intake of  fiber-rich whole grains. Exercise encouraged - at least 150 minutes per week and advance as tolerated.  Goal BMI < 25. Continue medications as prescribed. Follow up in 3-6 months as discussed.   Carcinoma of upper-outer quadrant of left breast in female, estrogen receptor positive ([place]) Followed by surgery, oncology, and radiation oncology. Taking Arimidex. Keep follow up appointments as scheduled.   Lymphedema Currently being treated at the lymphedema clinic. Continue therapy as scheduled.   Swelling of lower extremity Exertional dyspnea Due to personal history of radiation therapy and reported symptoms of DOE, orthopnea, and edema, will refer to cardiology for evaluation and possible echocardiogram. Labs pending, EKG and chest xray completed. -     DG Chest 2 View; Future -     EKG 12-Lead -     Ambulatory referral to Cardiology -     Brain natriuretic peptide -     CMP14+EGFR  Personal history of radiation therapy Due to personal history of radiation therapy and reported symptoms of DOE, orthopnea, and edema, will refer to cardiology for evaluation and possible echocardiogram.  -     DG Chest 2 View; Future -     EKG 12-Lead -     Ambulatory referral to Cardiology     Continue all other maintenance medications.  Follow up plan: Return in about 3 months (around 01/20/2019), or if symptoms worsen or fail to improve, for MD, HTN, Lipids, Thyroid.  Continue healthy lifestyle choices, including diet (rich in fruits, vegetables, and lean proteins, and low in salt and simple carbohydrates) and exercise (at least 30 minutes of moderate physical activity daily).  Educational handout given for DM  The above assessment and management plan was discussed with the patient. The patient verbalized understanding of and has agreed to the management plan. Patient is aware to call the clinic if they develop any new symptoms or if symptoms persist or worsen. Patient is aware when to return to the  clinic for a follow-up visit. Patient educated on when it is appropriate to go to the emergency department.   Monia Pouch, FNP-C [facility] [phone]

## 2018-10-20 NOTE — Patient Instructions (Signed)

## 2018-10-20 NOTE — Patient Instructions (Signed)
Follow-up for your annual exam in 1 year.  We will plan on repeating your Pap smear at that time.

## 2018-10-20 NOTE — Progress Notes (Signed)
    Alexis [name] 08-Apr-1955 [id number]        63 y.o.  [id number] presents for colposcopy.  Status post hysterectomy.  Pap smear 2014 showed VAIN 1.  Colposcopy with biopsy showed VAIN 1.  Pap smear/HPV 2016 was negative.  Regular Pap smear 2017 was negative.  Pap smear 2018 showed ASCUS negative high risk HPV.  Pap smear 2019 showed LGSIL with colposcopy showing no visible abnormalities.  Most recent Pap smear 2020 showed LGSIL.  Past medical history,surgical history, problem list, medications, allergies, family history and social history were all reviewed and documented in the EPIC chart.  Directed ROS with pertinent positives and negatives documented in the history of present illness/assessment and plan.  Exam: Alexis [name] assistant Vitals:   10/20/18 1152  BP: 124/80   General appearance:  Normal Abdomen soft nontender without mass guarding rebound Pelvic external BUS vagina with atrophic changes.  No visual abnormalities.  Bimanual with no palpable abnormalities along the vaginal mucosa and no gross pelvic masses or tenderness.  Colposcopy performed after acetic acid cleanse was normal inspecting from vaginal cuff through introital opening.  Assessment/Plan:  63 y.o. 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  Colposcopy today is negative.  Recommend follow-up Pap smear in 1 year.  We again discussed the issues of dysplasia, high-grade/low-grade, progression/regression and the possible HPV association.    Alexis Auerbach MD, 12:09 PM 10/20/2018

## 2018-10-21 LAB — CMP14+EGFR
ALT: 16 IU/L (ref 0–32)
AST: 15 IU/L (ref 0–40)
Albumin/Globulin Ratio: 1.7 (ref 1.2–2.2)
Albumin: 4.1 g/dL (ref 3.8–4.8)
Alkaline Phosphatase: 89 IU/L (ref 39–117)
BUN/Creatinine Ratio: 19 (ref 12–28)
BUN: 13 mg/dL (ref 8–27)
Bilirubin Total: 0.3 mg/dL (ref 0.0–1.2)
CO2: 27 mmol/L (ref 20–29)
Calcium: 9.1 mg/dL (ref 8.7–10.3)
Chloride: 103 mmol/L (ref 96–106)
Creatinine, Ser: 0.69 mg/dL (ref 0.57–1.00)
GFR calc Af Amer: 107 mL/min/{1.73_m2} (ref >59)
GFR calc non Af Amer: 93 mL/min/{1.73_m2} (ref >59)
Globulin, Total: 2.4 g/dL (ref 1.5–4.5)
Glucose: 136 mg/dL — ABNORMAL HIGH (ref 65–99)
Potassium: 4.1 mmol/L (ref 3.5–5.2)
Sodium: 141 mmol/L (ref 134–144)
Total Protein: 6.5 g/dL (ref 6.0–8.5)

## 2018-10-21 LAB — BRAIN NATRIURETIC PEPTIDE: BNP: 27.7 pg/mL (ref 0.0–100.0)

## 2018-10-23 MED FILL — PHENTERMINE 37.5 MG TABLET: 37.5 | 90 days supply | Qty: 90 | Fill #0

## 2018-10-24 ENCOUNTER — Other Ambulatory Visit: Payer: Self-pay

## 2018-10-24 DIAGNOSIS — Z20828 Contact with and (suspected) exposure to other viral communicable diseases: Secondary | ICD-10-CM | POA: Diagnosis not present

## 2018-10-24 DIAGNOSIS — Z20822 Contact with and (suspected) exposure to covid-19: Secondary | ICD-10-CM

## 2018-10-26 LAB — NOVEL CORONAVIRUS, NAA: SARS-CoV-2, NAA: NOT DETECTED

## 2018-11-08 ENCOUNTER — Ambulatory Visit: Payer: 59

## 2018-11-28 DIAGNOSIS — Z170 Estrogen receptor positive status [ER+]: Secondary | ICD-10-CM | POA: Diagnosis not present

## 2018-11-28 DIAGNOSIS — C50412 Malignant neoplasm of upper-outer quadrant of left female breast: Secondary | ICD-10-CM | POA: Diagnosis not present

## 2018-11-28 DIAGNOSIS — Z9889 Other specified postprocedural states: Secondary | ICD-10-CM | POA: Diagnosis not present

## 2018-11-29 ENCOUNTER — Telehealth: Payer: Self-pay | Admitting: Family Medicine

## 2018-11-29 DIAGNOSIS — M7989 Other specified soft tissue disorders: Secondary | ICD-10-CM

## 2018-11-29 NOTE — Telephone Encounter (Signed)
Appointment given for tomorrow with Sharyn Lull.

## 2018-11-30 ENCOUNTER — Ambulatory Visit (INDEPENDENT_AMBULATORY_CARE_PROVIDER_SITE_OTHER): Payer: 59 | Admitting: Family Medicine

## 2018-11-30 ENCOUNTER — Encounter: Payer: Self-pay | Admitting: Family Medicine

## 2018-11-30 DIAGNOSIS — I10 Essential (primary) hypertension: Secondary | ICD-10-CM

## 2018-11-30 DIAGNOSIS — E1159 Type 2 diabetes mellitus with other circulatory complications: Secondary | ICD-10-CM | POA: Diagnosis not present

## 2018-11-30 DIAGNOSIS — I152 Hypertension secondary to endocrine disorders: Secondary | ICD-10-CM

## 2018-11-30 DIAGNOSIS — R0600 Dyspnea, unspecified: Secondary | ICD-10-CM | POA: Diagnosis not present

## 2018-11-30 DIAGNOSIS — R0609 Other forms of dyspnea: Secondary | ICD-10-CM

## 2018-11-30 DIAGNOSIS — M7989 Other specified soft tissue disorders: Secondary | ICD-10-CM | POA: Diagnosis not present

## 2018-11-30 MED ORDER — FUROSEMIDE 20 MG PO TABS: 20.0000 mg | ORAL_TABLET | Freq: Every day | ORAL | 3 refills | Status: DC

## 2018-11-30 NOTE — Progress Notes (Signed)
Attempted to call at 1120, no answer. Attempted to call at 1130, no answer.     Virtual Visit via telephone Note Due to COVID-19 pandemic this visit was conducted virtually. This visit type was conducted due to national recommendations for restrictions regarding the COVID-19 Pandemic (e.g. social distancing, sheltering in place) in an effort to limit this patient's exposure and mitigate transmission in our community. All issues noted in this document were discussed and addressed.  A physical exam was not performed with this format.   I connected with Alexis [name] on 11/30/2018 at 1150 by telephone and verified that I am speaking with the correct person using two identifiers. Alexis [name] is currently located at home and family is currently with them during visit. The provider, Monia Pouch, FNP is located in their office at time of visit.  I discussed the limitations, risks, security and privacy concerns of performing an evaluation and management service by telephone and the availability of in person appointments. I also discussed with the patient that there may be a patient responsible charge related to this service. The patient expressed understanding and agreed to proceed.  Subjective:  Patient ID: Alexis [name], female    DOB: [birth year], 63 y.o.   MRN: [id number]  Chief Complaint:  Leg Swelling   HPI: Alexis [name] is a 63 y.o. female presenting on 11/30/2018 for Leg Swelling   Pt with continued DOE, swelling in lower extremities, and elevated blood pressure. Pt had an in office visit on 10/20/2018 and was referred to cardiology for above symptoms. She states she has yet to get an appointment and would like for a new referral to be replaced. She reports the swelling in her lower extremities has increased and that her DOE is more pronounced. She would like something to help with the edema. She states the DOE is not significant enough to go to the ED  and she will if is becomes this bad.     Relevant past medical, surgical, family, and social history reviewed and updated as indicated.  Allergies and medications reviewed and updated.   Past Medical History:  Diagnosis Date   ASCUS of cervix with negative high risk HPV 09/2016   Breast cancer ([place]) 2019   Left Breast Lumpectomy    Cancer ([place])    DDD (degenerative disc disease), lumbar    Diabetes mellitus without complication ([place])    Type 2, IDDM   Diverticulitis    Diverticulitis    Facial trauma    forehead,nose and upper lip secondary to fall    Fall    GERD without esophagitis    History of bronchitis    History of colon polyps    History of kidney stones    History of urinary tract infection    Hyperparathyroidism ([place])    Parathyroid tumor    Personal history of radiation therapy 2019   Right Breast Cancer   Plantar fasciitis    PONV (postoperative nausea and vomiting)    emotional when awakens / cries   Seasonal allergies    VAIN I (vaginal intraepithelial neoplasia grade I) 04/2012, 09/2017   Vitamin D deficiency    Yeast infection    history of     Past Surgical History:  Procedure Laterality Date   ABDOMINAL HYSTERECTOMY     APPENDECTOMY     BREAST LUMPECTOMY Left 10/11/2017   CARPAL TUNNEL RELEASE     right and left    CESAREAN SECTION  CHOLECYSTECTOMY     COLON SURGERY     colonscopy      DILATION AND CURETTAGE OF UTERUS     HYSTEROSCOPY  2003   LAVH  2004   leiomyoma/irregular bleeding   PARATHYROIDECTOMY N/A 02/21/2015   Procedure: RIGHT PARATHYROIDECTOMY;  Surgeon: [name], MD;  Location: [facility];  Service: General;  Laterality: N/A;   PARTIAL MASTECTOMY WITH AXILLARY SENTINEL LYMPH NODE BIOPSY Left 10/11/2017   Procedure: LEFT BREAST PARTIAL MASTECTOMY WITH AXILLARY SENTINEL LYMPH NODE BIOPSY;  Surgeon: [name], MD;  Location: [facility];  Service: General;  Laterality: Left;    TUBAL LIGATION      Social History   Socioeconomic History   Marital status: Divorced    Spouse name: Not on file   Number of children: 2   Years of education: Not on file   Highest education level: Not on file  Occupational History   Not on file  Social Needs   Financial resource strain: Not on file   Food insecurity    Worry: Not on file    Inability: Not on file   Transportation needs    Medical: No    Non-medical: No  Tobacco Use   Smoking status: Never Smoker   Smokeless tobacco: Never Used  Substance and Sexual Activity   Alcohol use: No    Alcohol/week: 0.0 standard drinks   Drug use: No   Sexual activity: Yes    Birth control/protection: Surgical, None    Comment: 1st intercourse 29 yo-5 partners  Lifestyle   Physical activity    Days per week: Not on file    Minutes per session: Not on file   Stress: Not on file  Relationships   Social connections    Talks on phone: Not on file    Gets together: Not on file    Attends religious service: Not on file    Active member of club or organization: Not on file    Attends meetings of clubs or organizations: Not on file    Relationship status: Not on file   Intimate partner violence    Fear of current or ex partner: No    Emotionally abused: No    Physically abused: No    Forced sexual activity: No  Other Topics Concern   Not on file  Social History Narrative   Daughter and her 5 children live with pt     Outpatient Encounter Medications as of 11/30/2018  Medication Sig   ACCU-CHEK FASTCLIX LANCETS MISC USE 3 TIMES A DAY AS DIRECTED   ACCU-CHEK GUIDE test strip USE 3 TIMES A DAY AS DIRECTED   anastrozole (ARIMIDEX) 1 MG tablet Take 0.1 mg by mouth daily.    aspirin 81 MG tablet Take 81 mg by mouth daily.    Cholecalciferol (VITAMIN D PO) Take by mouth.   furosemide (LASIX) 20 MG tablet Take 1 tablet (20 mg total) by mouth daily.   Insulin Regular Human (HUMULIN R U-500 KWIKPEN [place])  Inject into the skin. Takes 100 am, 70 lunch, 90 dinner   rosuvastatin (CRESTOR) 5 MG tablet Take 5 mg by mouth daily.   UNIFINE PENTIPS 31G X 5 MM MISC USE TWICE A DAY AS DIRECTED   No facility-administered encounter medications on file as of 11/30/2018.     Allergies  Allergen Reactions   Canagliflozin     Other reaction(s): yeast infection   Empagliflozin     Other reaction(s): upset stomach  Metformin Hcl     Other reaction(s): diarrhea   Parlodel [Bromocriptine Mesylate] Hives    Review of Systems  Constitutional: Positive for fatigue and unexpected weight change. Negative for activity change, appetite change, chills, diaphoresis and fever.  HENT: Negative.   Eyes: Negative.  Negative for photophobia and visual disturbance.  Respiratory: Positive for shortness of breath. Negative for cough and chest tightness.   Cardiovascular: Positive for leg swelling. Negative for chest pain and palpitations.  Gastrointestinal: Positive for abdominal distention. Negative for abdominal pain, blood in stool, constipation, diarrhea, nausea and vomiting.  Endocrine: Negative.   Genitourinary: Negative for decreased urine volume, difficulty urinating, dysuria, frequency and urgency.  Musculoskeletal: Negative for arthralgias and myalgias.  Skin: Negative.  Negative for color change, pallor and rash.  Allergic/Immunologic: Negative.   Neurological: Negative for dizziness, tremors, seizures, syncope, speech difficulty, weakness, light-headedness, numbness and headaches.  Hematological: Negative.   Psychiatric/Behavioral: Negative for confusion, hallucinations, sleep disturbance and suicidal ideas.  All other systems reviewed and are negative.        Observations/Objective: No vital signs or physical exam, this was a telephone or virtual health encounter.  Pt alert and oriented, answers all questions appropriately, and able to speak in full sentences.    Assessment and  Plan: Alexis [name] was seen today for leg swelling.  Diagnoses and all orders for this visit:  Swelling of lower extremity Exertional dyspnea Hypertension associated with type 2 diabetes mellitus ([facility]) Called and spoke with heather at Dr. Clayborne Dana office. States she will schedule the appointment and notify of details. Pt aware. Will start on lasix 20 mg daily. Repeat CMP in 2 weeks. Report any new or worsening symptoms. Pt aware of symptoms that require emergent evaluation.  -     furosemide (LASIX) 20 MG tablet; Take 1 tablet (20 mg total) by mouth daily.     Follow Up Instructions: Return if symptoms worsen or fail to improve.    I discussed the assessment and treatment plan with the patient. The patient was provided an opportunity to ask questions and all were answered. The patient agreed with the plan and demonstrated an understanding of the instructions.   The patient was advised to call back or seek an in-person evaluation if the symptoms worsen or if the condition fails to improve as anticipated.  The above assessment and management plan was discussed with the patient. The patient verbalized understanding of and has agreed to the management plan. Patient is aware to call the clinic if they develop any new symptoms or if symptoms persist or worsen. Patient is aware when to return to the clinic for a follow-up visit. Patient educated on when it is appropriate to go to the emergency department.    I provided 15 minutes of non-face-to-face time during this encounter. The call started at 1150. The call ended at 1205. The other time was used for coordination of care.    Monia Pouch, FNP-C [facility] [address] [place], [place] [address] [phone] 11/30/2018

## 2018-12-04 ENCOUNTER — Ambulatory Visit
Admission: RE | Admit: 2018-12-04 | Discharge: 2018-12-04 | Disposition: A | Discharge status: Home / Self Care | Payer: 59 | Source: Ambulatory Visit | Attending: Radiation Oncology | Admitting: Radiation Oncology

## 2018-12-04 ENCOUNTER — Other Ambulatory Visit: Payer: Self-pay

## 2018-12-04 ENCOUNTER — Encounter: Payer: Self-pay | Admitting: Radiation Oncology

## 2018-12-04 ENCOUNTER — Telehealth (HOSPITAL_COMMUNITY): Payer: Self-pay | Admitting: *Deleted

## 2018-12-04 VITALS — BP 144/51 | HR 73 | Temp 98.7°F | Resp 20 | Wt 219.4 lb

## 2018-12-04 DIAGNOSIS — C50412 Malignant neoplasm of upper-outer quadrant of left female breast: Secondary | ICD-10-CM | POA: Diagnosis not present

## 2018-12-04 DIAGNOSIS — Z08 Encounter for follow-up examination after completed treatment for malignant neoplasm: Secondary | ICD-10-CM | POA: Diagnosis not present

## 2018-12-04 DIAGNOSIS — Z79811 Long term (current) use of aromatase inhibitors: Secondary | ICD-10-CM | POA: Diagnosis not present

## 2018-12-04 DIAGNOSIS — Z7982 Long term (current) use of aspirin: Secondary | ICD-10-CM | POA: Insufficient documentation

## 2018-12-04 DIAGNOSIS — Z170 Estrogen receptor positive status [ER+]: Secondary | ICD-10-CM | POA: Diagnosis not present

## 2018-12-04 DIAGNOSIS — Z794 Long term (current) use of insulin: Secondary | ICD-10-CM | POA: Diagnosis not present

## 2018-12-04 DIAGNOSIS — R0600 Dyspnea, unspecified: Secondary | ICD-10-CM

## 2018-12-04 DIAGNOSIS — Z79899 Other long term (current) drug therapy: Secondary | ICD-10-CM | POA: Insufficient documentation

## 2018-12-04 NOTE — Progress Notes (Signed)
Patient in for follow up doing well. She is having some issues with her heart possible. She has an ECHO scheduled for tomorrow.

## 2018-12-04 NOTE — Patient Instructions (Signed)
Coronavirus (COVID-19) Are you at risk?  Are you at risk for the Coronavirus (COVID-19)?  To be considered HIGH RISK for Coronavirus (COVID-19), you have to meet the following criteria:  . Traveled to [place], [place], [place], [place] or [place]; or in the [place] to [place], [place], [place], or [place]; and have fever, cough, and shortness of breath within the last 2 weeks of travel OR . Been in close contact with a person diagnosed with COVID-19 within the last 2 weeks and have fever, cough, and shortness of breath . IF YOU DO NOT MEET THESE CRITERIA, YOU ARE CONSIDERED LOW RISK FOR COVID-19.  What to do if you are HIGH RISK for COVID-19?  . If you are having a medical emergency, call 911. . Seek medical care right away. Before you go to a doctor's office, urgent care or emergency department, call ahead and tell them about your recent travel, contact with someone diagnosed with COVID-19, and your symptoms. You should receive instructions from your physician's office regarding next steps of care.  . When you arrive at healthcare provider, tell the healthcare staff immediately you have returned from visiting [place], [place], [place], [place] or [place]; or traveled in the [place] to [place], [place], [place], or [place]; in the last two weeks or you have been in close contact with a person diagnosed with COVID-19 in the last 2 weeks.   . Tell the health care staff about your symptoms: fever, cough and shortness of breath. . After you have been seen by a medical provider, you will be either: o Tested for (COVID-19) and discharged home on quarantine except to seek medical care if symptoms worsen, and asked to  - Stay home and avoid contact with others until you get your results (4-5 days)  - Avoid travel on public transportation if possible (such as bus, train, or airplane) or o Sent to the Emergency Department by EMS for evaluation, COVID-19 testing, and possible  admission depending on your condition and test results.  What to do if you are LOW RISK for COVID-19?  Reduce your risk of any infection by using the same precautions used for avoiding the common cold or flu:  . Wash your hands often with soap and warm water for at least 20 seconds.  If soap and water are not readily available, use an alcohol-based hand sanitizer with at least 60% alcohol.  . If coughing or sneezing, cover your mouth and nose by coughing or sneezing into the elbow areas of your shirt or coat, into a tissue or into your sleeve (not your hands). . Avoid shaking hands with others and consider head nods or verbal greetings only. . Avoid touching your eyes, nose, or mouth with unwashed hands.  . Avoid close contact with people who are sick. . Avoid places or events with large numbers of people in one location, like concerts or sporting events. . Carefully consider travel plans you have or are making. . If you are planning any travel outside or inside the [place], visit the CDC's Travelers' Health webpage for the latest health notices. . If you have some symptoms but not all symptoms, continue to monitor at home and seek medical attention if your symptoms worsen. . If you are having a medical emergency, call 911.   ADDITIONAL HEALTHCARE OPTIONS FOR PATIENTS  [facility] / e-Visit: [email or web address]         [facility]: [phone]  [facility]: [phone]                   [facility]: [phone]   

## 2018-12-04 NOTE — Progress Notes (Signed)
Radiation Oncology         [phone] ________________________________  Name: Alexis [name] MRN: [id number]  Date: 12/04/2018  DOB: 02-19-1955  Follow-Up Visit Note  CC: Rakes, Connye Burkitt, FNP  [name], MD    ICD-10-CM   1. Carcinoma of upper-outer quadrant of left breast in female, estrogen receptor positive ([place])  C50.412    Z17.0     Diagnosis: Invasive ductal carcinoma of UOQ left breast (pT2, pN0(sn), cM0, G2, ER+, PR+, HER2-)  Interval Since Last Radiation: Ten months and two weeks. 11/28/17 - 01/17/18: 1. Left breast / 50.4 Gy in 28 fractions 2. Lumpectomy cavity Boost / 12 Gy in 6 fractions  Narrative:  The patient returns today for routine follow-up.   Bilateral diagnostic mammogram on 09/29/2018 showed no evidence of new or recurrent breast carcinoma. It also showed partly imaged benign postsurgical changes in the posterior upper outer left breast.  She saw her surgeon Dr. Harlow Asa earlier this month with a good report.  On review of systems, she reports fluid retention throughout her body.  She also complains of dyspnea with exertion.  She denies any chest pain.  ALLERGIES:  is allergic to canagliflozin; empagliflozin; metformin hcl; and parlodel [bromocriptine mesylate].  Meds: Current Outpatient Medications  Medication Sig Dispense Refill  . ACCU-CHEK FASTCLIX LANCETS MISC USE 3 TIMES A DAY AS DIRECTED  3  . ACCU-CHEK GUIDE test strip USE 3 TIMES A DAY AS DIRECTED  3  . anastrozole (ARIMIDEX) 1 MG tablet Take 0.1 mg by mouth daily.     Marland Kitchen aspirin 81 MG tablet Take 81 mg by mouth daily.     . cetirizine (ZYRTEC) 10 MG tablet Take 10 mg by mouth daily.    . furosemide (LASIX) 20 MG tablet Take 1 tablet (20 mg total) by mouth daily. 30 tablet 3  . Insulin Regular Human (HUMULIN R U-500 KWIKPEN [place]) Inject into the skin. Takes 100 am, 70 lunch, 90 dinner    . UNIFINE PENTIPS 31G X 5 MM MISC USE TWICE A DAY AS DIRECTED  2  . Cholecalciferol (VITAMIN D PO)  Take by mouth.     No current facility-administered medications for this encounter.     Physical Findings: The patient is in no acute distress. Patient is alert and oriented.  weight is 219 lb 6.4 oz (99.5 kg). Her temperature is 98.7 F (37.1 C). Her blood pressure is 144/51 (abnormal) and her pulse is 73. Her respiration is 20 and oxygen saturation is 98%. .   Lungs are clear to auscultation bilaterally. Heart has regular rate and rhythm. No palpable cervical, supraclavicular, or axillary adenopathy. Abdomen soft, non-tender, normal bowel sounds. Right breast: no palpable mass, nipple discharge, or bleeding.  Left breast: Skin is well-healed.  Hyperpigmentation changes are noted throughout the left breast.  No palpable mass nipple discharge or bleeding.  No visible signs of recurrence.  Patient is noted to have edema throughout all of her extremities   Lab Findings: Lab Results  Component Value Date   WBC 17.2 (H) 12/06/2016   HGB 16.5 (H) 12/06/2016   HCT 51.6 (H) 12/06/2016   MCV 87.6 12/06/2016   PLT 330 12/06/2016    Radiographic Findings: No results found.  Impression:  No evidence of recurrence on clinical exam.   Plan: As needed follow-up in radiation oncology.  The patient will continue close follow-up with general surgery and medical oncology.  She will continue on Arimidex.  The patient will have an  echo tomorrow to evaluate possible cardiac causes of her fluid retention and weight gain.  ____________________________________   Blair Promise, PhD, MD   This document serves as a record of services personally performed by Gery Pray, MD. It was created on his behalf by Clerance Lav, a trained medical scribe. The creation of this record is based on the scribe's personal observations and the provider's statements to them. This document has been checked and approved by the attending provider.

## 2018-12-04 NOTE — Telephone Encounter (Signed)
Received referral from pt's PCP, pt has h/o breast cancer and has been having issues with fluid retention and sob.  Per Dr Haroldine Laws, needs echo and appt w/him.  Echo order placed and sch for Wed 11/25 at 2 pm, will call her back w/appt w/Dr Bensimhon

## 2018-12-05 ENCOUNTER — Ambulatory Visit (HOSPITAL_COMMUNITY)
Admission: RE | Admit: 2018-12-05 | Discharge: 2018-12-05 | Disposition: A | Discharge status: Home / Self Care | Payer: 59 | Source: Ambulatory Visit | Attending: Internal Medicine | Admitting: Internal Medicine

## 2018-12-05 DIAGNOSIS — I517 Cardiomegaly: Secondary | ICD-10-CM | POA: Insufficient documentation

## 2018-12-05 DIAGNOSIS — C50919 Malignant neoplasm of unspecified site of unspecified female breast: Secondary | ICD-10-CM | POA: Insufficient documentation

## 2018-12-05 DIAGNOSIS — R0600 Dyspnea, unspecified: Secondary | ICD-10-CM | POA: Diagnosis not present

## 2018-12-05 DIAGNOSIS — I10 Essential (primary) hypertension: Secondary | ICD-10-CM | POA: Insufficient documentation

## 2018-12-05 DIAGNOSIS — E785 Hyperlipidemia, unspecified: Secondary | ICD-10-CM | POA: Diagnosis not present

## 2018-12-05 DIAGNOSIS — E119 Type 2 diabetes mellitus without complications: Secondary | ICD-10-CM | POA: Diagnosis not present

## 2018-12-05 DIAGNOSIS — I340 Nonrheumatic mitral (valve) insufficiency: Secondary | ICD-10-CM | POA: Insufficient documentation

## 2018-12-05 NOTE — Progress Notes (Signed)
  Echocardiogram 2D Echocardiogram has been performed.  Alexis [name] 12/05/2018, 8:52 AM

## 2018-12-06 ENCOUNTER — Ambulatory Visit (HOSPITAL_COMMUNITY): Payer: 59

## 2018-12-13 DIAGNOSIS — E1165 Type 2 diabetes mellitus with hyperglycemia: Secondary | ICD-10-CM | POA: Diagnosis not present

## 2018-12-13 DIAGNOSIS — E785 Hyperlipidemia, unspecified: Secondary | ICD-10-CM | POA: Diagnosis not present

## 2018-12-13 DIAGNOSIS — E538 Deficiency of other specified B group vitamins: Secondary | ICD-10-CM | POA: Diagnosis not present

## 2018-12-13 DIAGNOSIS — Z8639 Personal history of other endocrine, nutritional and metabolic disease: Secondary | ICD-10-CM | POA: Diagnosis not present

## 2018-12-28 ENCOUNTER — Encounter: Payer: Self-pay | Admitting: Hematology and Oncology

## 2018-12-28 ENCOUNTER — Telehealth: Payer: Self-pay | Admitting: Family Medicine

## 2018-12-29 NOTE — Telephone Encounter (Signed)
Pt called and reviewed Echo results. Pt aware to reach out to cardiology for further instructions.

## 2019-01-26 ENCOUNTER — Ambulatory Visit (INDEPENDENT_AMBULATORY_CARE_PROVIDER_SITE_OTHER): Payer: 59 | Admitting: Family Medicine

## 2019-01-26 ENCOUNTER — Encounter: Payer: Self-pay | Admitting: Family Medicine

## 2019-01-26 DIAGNOSIS — Z794 Long term (current) use of insulin: Secondary | ICD-10-CM | POA: Diagnosis not present

## 2019-01-26 DIAGNOSIS — E1159 Type 2 diabetes mellitus with other circulatory complications: Secondary | ICD-10-CM | POA: Diagnosis not present

## 2019-01-26 DIAGNOSIS — I152 Hypertension secondary to endocrine disorders: Secondary | ICD-10-CM

## 2019-01-26 DIAGNOSIS — E119 Type 2 diabetes mellitus without complications: Secondary | ICD-10-CM

## 2019-01-26 DIAGNOSIS — M7989 Other specified soft tissue disorders: Secondary | ICD-10-CM | POA: Insufficient documentation

## 2019-01-26 DIAGNOSIS — E1169 Type 2 diabetes mellitus with other specified complication: Secondary | ICD-10-CM

## 2019-01-26 DIAGNOSIS — E785 Hyperlipidemia, unspecified: Secondary | ICD-10-CM | POA: Diagnosis not present

## 2019-01-26 DIAGNOSIS — I10 Essential (primary) hypertension: Secondary | ICD-10-CM

## 2019-01-26 DIAGNOSIS — I890 Lymphedema, not elsewhere classified: Secondary | ICD-10-CM | POA: Diagnosis not present

## 2019-01-26 MED ORDER — FUROSEMIDE 20 MG PO TABS: 20.0000 mg | ORAL_TABLET | Freq: Two times a day (BID) | ORAL | 2 refills | Status: DC

## 2019-01-26 MED FILL — FUROSEMIDE 20 MG TABS: 20 | 90 days supply | Qty: 180 | Fill #0

## 2019-01-26 NOTE — Progress Notes (Signed)
Virtual Visit via telephone Note Due to COVID-19 pandemic this visit was conducted virtually. This visit type was conducted due to national recommendations for restrictions regarding the COVID-19 Pandemic (e.g. social distancing, sheltering in place) in an effort to limit this patient's exposure and mitigate transmission in our community. All issues noted in this document were discussed and addressed.  A physical exam was not performed with this format.   I connected with Alexis [name] on 01/26/2019 at 1255 by telephone and verified that I am speaking with the correct person using two identifiers. Alexis [name] is currently located at home and family is currently with them during visit. The provider, Monia Pouch, FNP is located in their office at time of visit.  I discussed the limitations, risks, security and privacy concerns of performing an evaluation and management service by telephone and the availability of in person appointments. I also discussed with the patient that there may be a patient responsible charge related to this service. The patient expressed understanding and agreed to proceed.  Subjective:  Patient ID: Alexis [name], female    DOB: [birth year], 64 y.o.   MRN: [id number]  Chief Complaint:  Medical Management of Chronic Issues   HPI: Alexis [name] is a 64 y.o. female presenting on 01/26/2019 for Medical Management of Chronic Issues   Patient following up today for management of chronic medical conditions.  Patient reports she has been doing fairly well.  States she still has swelling in her lower extremities.  States the Lasix 20 mg once daily has been slightly beneficial.  She denies any chest pain, shortness of breath, fatigue, palpitations, dizziness, or syncope.  She does have slight exertional dyspnea after walking long distances.  She did have an echocardiogram with Dr. Haroldine Laws in November 2020.  She reports she has not received a  call pertaining to this.  Echo results reviewed with patient.  Patient would like to speak to cardiologist pertaining to these results.  Patient sees Dr. Buddy Duty for her diabetes.  States Dr. Buddy Duty recently started her on statin therapy.  She reports her blood sugars have been very well controlled and Dr. Buddy Duty is only seeing her every 6 months now.  She states her last blood sugar was 136.  She has a history of breast cancer and is followed by oncology.  She has lymphedema post treatment.  She did see the lymphedema clinic.  She states that her blood pressure has been slightly elevated but not significantly elevated.  Patient states she is doing fairly well overall.  She is still working Monday through Friday.  Will continue to have blood pressure checks at work and report any persistent high or low readings.    Relevant past medical, surgical, family, and social history reviewed and updated as indicated.  Allergies and medications reviewed and updated.   Past Medical History:  Diagnosis Date  . ASCUS of cervix with negative high risk HPV 09/2016  . Breast cancer ([place]) 2019   Left Breast Lumpectomy   . Cancer ([place])   . DDD (degenerative disc disease), lumbar   . Diabetes mellitus without complication (HCC)    Type 2, IDDM  . Diverticulitis   . Diverticulitis   . Facial trauma    forehead,nose and upper lip secondary to fall   . Fall   . GERD without esophagitis   . History of bronchitis   . History of colon polyps   . History of kidney stones   . History  of urinary tract infection   . Hyperparathyroidism ([place])   . Parathyroid tumor   . Personal history of radiation therapy 2019   Right Breast Cancer  . Plantar fasciitis   . PONV (postoperative nausea and vomiting)    emotional when awakens / cries  . Seasonal allergies   . 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 Vitamin D deficiency   . Yeast infection    history of     Past Surgical History:    Procedure Laterality Date  . ABDOMINAL HYSTERECTOMY    . APPENDECTOMY    . BREAST LUMPECTOMY Left 10/11/2017  . CARPAL TUNNEL RELEASE     right and left   . CESAREAN SECTION    . CHOLECYSTECTOMY    . COLON SURGERY    . colonscopy     . DILATION AND CURETTAGE OF UTERUS    . HYSTEROSCOPY  2003  . LAVH  2004   leiomyoma/irregular bleeding  . PARATHYROIDECTOMY N/A 02/21/2015   Procedure: RIGHT PARATHYROIDECTOMY;  Surgeon: [name], MD;  Location: [facility];  Service: General;  Laterality: N/A;  . PARTIAL MASTECTOMY WITH AXILLARY SENTINEL LYMPH NODE BIOPSY Left 10/11/2017   Procedure: LEFT BREAST PARTIAL MASTECTOMY WITH AXILLARY SENTINEL LYMPH NODE BIOPSY;  Surgeon: [name], MD;  Location: [facility];  Service: General;  Laterality: Left;  . TUBAL LIGATION      Social History   Socioeconomic History  . Marital status: Divorced    Spouse name: Not on file  . Number of children: 2  . Years of education: Not on file  . Highest education level: Not on file  Occupational History  . Not on file  Tobacco Use  . Smoking status: Never Smoker  . Smokeless tobacco: Never Used  Substance and Sexual Activity  . Alcohol use: No    Alcohol/week: 0.0 standard drinks  . Drug use: No  . Sexual activity: Yes    Birth control/protection: Surgical, None    Comment: 1st intercourse 8 yo-5 partners  Other Topics Concern  . Not on file  Social History Narrative   Daughter and her 5 children live with pt    Social Determinants of Health   Financial Resource Strain:   . Difficulty of Paying Living Expenses: Not on file  Food Insecurity:   . Worried About Charity fundraiser in the Last Year: Not on file  . Ran Out of Food in the Last Year: Not on file  Transportation Needs: No Transportation Needs  . Lack of Transportation (Medical): No  . Lack of Transportation (Non-Medical): No  Physical Activity:   . Days of Exercise per Week: Not on file  . Minutes of Exercise per  Session: Not on file  Stress:   . Feeling of Stress : Not on file  Social Connections:   . Frequency of Communication with Friends and Family: Not on file  . Frequency of Social Gatherings with Friends and Family: Not on file  . Attends Religious Services: Not on file  . Active Member of Clubs or Organizations: Not on file  . Attends Archivist Meetings: Not on file  . Marital Status: Not on file  Intimate Partner Violence: Not At Risk  . Fear of Current or Ex-Partner: No  . Emotionally Abused: No  . Physically Abused: No  . Sexually Abused: No    Outpatient Encounter Medications as of 01/26/2019  Medication Sig  . ACCU-CHEK FASTCLIX LANCETS MISC USE  3 TIMES A DAY AS DIRECTED  . ACCU-CHEK GUIDE test strip USE 3 TIMES A DAY AS DIRECTED  . anastrozole (ARIMIDEX) 1 MG tablet Take 0.1 mg by mouth daily.   Marland Kitchen aspirin 81 MG tablet Take 81 mg by mouth daily.   . cetirizine (ZYRTEC) 10 MG tablet Take 10 mg by mouth daily.  . Cholecalciferol (VITAMIN D PO) Take by mouth.  . furosemide (LASIX) 20 MG tablet Take 1 tablet (20 mg total) by mouth 2 (two) times daily.  . Insulin Regular Human (HUMULIN R U-500 KWIKPEN [place]) Inject into the skin. Takes 100 am, 70 lunch, 90 dinner  . UNIFINE PENTIPS 31G X 5 MM MISC USE TWICE A DAY AS DIRECTED  . [DISCONTINUED] furosemide (LASIX) 20 MG tablet Take 1 tablet (20 mg total) by mouth daily.   No facility-administered encounter medications on file as of 01/26/2019.    Allergies  Allergen Reactions  . Canagliflozin     Other reaction(s): yeast infection  . Empagliflozin     Other reaction(s): upset stomach  . Metformin Hcl     Other reaction(s): diarrhea  . Parlodel [Bromocriptine Mesylate] Hives    Review of Systems  Constitutional: Negative for activity change, appetite change, chills, diaphoresis, fatigue, fever and unexpected weight change.  HENT: Negative.   Eyes: Negative.  Negative for photophobia and visual disturbance.    Respiratory: Positive for shortness of breath. Negative for cough and chest tightness.   Cardiovascular: Positive for leg swelling. Negative for chest pain and palpitations.  Gastrointestinal: Negative for abdominal pain, blood in stool, constipation, diarrhea, nausea and vomiting.  Endocrine: Negative.  Negative for polydipsia, polyphagia and polyuria.  Genitourinary: Negative for decreased urine volume, difficulty urinating, dysuria, frequency and urgency.  Musculoskeletal: Negative for arthralgias and myalgias.  Skin: Negative.   Allergic/Immunologic: Negative.   Neurological: Negative for dizziness, tremors, seizures, syncope, facial asymmetry, speech difficulty, weakness, light-headedness, numbness and headaches.  Hematological: Negative.   Psychiatric/Behavioral: Negative for confusion, hallucinations, sleep disturbance and suicidal ideas.  All other systems reviewed and are negative.        Observations/Objective: No vital signs or physical exam, this was a telephone or virtual health encounter.  Pt alert and oriented, answers all questions appropriately, and able to speak in full sentences.    Assessment and Plan: Alexis [name] was seen today for medical management of chronic issues.  Diagnoses and all orders for this visit:  Hypertension associated with type 2 diabetes mellitus ([place]) Reports blood pressures have been slightly elevated.  Will change Lasix to 20 mg twice daily for dual benefit.  This will help with lower extremity swelling and elevated blood pressure readings.  Patient aware to monitor blood pressure and report any persistent high or low readings.  Patient will follow up in office in 2 to 3 months for labs and blood pressure evaluation. -     furosemide (LASIX) 20 MG tablet; Take 1 tablet (20 mg total) by mouth 2 (two) times daily.  Hyperlipidemia associated with type 2 diabetes mellitus ([place]) Currently started on statin therapy by Dr. Buddy Duty.  Labs recently  completed by Dr. Buddy Duty.  Patient will have those labs faxed to office.  Diet and exercise encouraged.  Follow-up in 2 to 3 months for lab work.  Type 2 diabetes mellitus without complication, with long-term current use of insulin ([place]) Followed by Dr. Buddy Duty.  States she is doing very well on current regiment with blood sugars ranging from 130-160.  States Dr. Buddy Duty has moved  her appointments out to every 6 months.  No recent changes in regimen.  Lymphedema Swelling of lower extremity Ongoing lower leg swelling and lymphedema post breast cancer treatment.  Lasix 20 mg daily was slightly beneficial will increase to 20 mg twice daily.  Patient will follow up in 2 to 3 months for lab work. -     furosemide (LASIX) 20 MG tablet; Take 1 tablet (20 mg total) by mouth 2 (two) times daily.     Follow Up Instructions: Return in about 3 months (around 04/26/2019), or if symptoms worsen or fail to improve.    I discussed the assessment and treatment plan with the patient. The patient was provided an opportunity to ask questions and all were answered. The patient agreed with the plan and demonstrated an understanding of the instructions.   The patient was advised to call back or seek an in-person evaluation if the symptoms worsen or if the condition fails to improve as anticipated.  The above assessment and management plan was discussed with the patient. The patient verbalized understanding of and has agreed to the management plan. Patient is aware to call the clinic if they develop any new symptoms or if symptoms persist or worsen. Patient is aware when to return to the clinic for a follow-up visit. Patient educated on when it is appropriate to go to the emergency department.    I provided 25 minutes of non-face-to-face time during this encounter. The call started at 1255. The call ended at 1320. The other time was used for coordination of care.    Monia Pouch, FNP-C [facility] [address] [place], [place] [address] [phone] 01/26/2019

## 2019-03-08 ENCOUNTER — Encounter: Payer: Self-pay | Admitting: Adult Health

## 2019-03-09 ENCOUNTER — Other Ambulatory Visit: Payer: Self-pay | Admitting: Adult Health

## 2019-03-09 ENCOUNTER — Ambulatory Visit (HOSPITAL_COMMUNITY): Payer: 59

## 2019-03-09 DIAGNOSIS — Z170 Estrogen receptor positive status [ER+]: Secondary | ICD-10-CM

## 2019-03-09 DIAGNOSIS — C50412 Malignant neoplasm of upper-outer quadrant of left female breast: Secondary | ICD-10-CM

## 2019-03-09 NOTE — Progress Notes (Signed)
Patient out of pocket cost for full breast MRI at [facility] is [phone] and at [facility] is 2256.  She cannot afford this.  I messaged with Dr. Autumn Patty and Dr. Enriqueta Shutter about her mammogram and whether or not based on those results if she could be screened with a FAST MRI.  Dr. Enriqueta Shutter noted the FAST MRI would be adequate.  Orders placed today.  I reviewed this with Alexis [name] who is in agreement.  We discussed that with the cone focus plan, an MRI is $150.  I called and canceled her full MRI at [facility] today.  I reviewed her next appt is with Dr. Lindi Adie and is in April.  She will call us with any needs that arise between now and then.   Wilber Bihari, NP

## 2019-03-27 ENCOUNTER — Other Ambulatory Visit: Payer: Self-pay | Admitting: Hematology and Oncology

## 2019-04-06 ENCOUNTER — Ambulatory Visit: Payer: 59

## 2019-04-25 ENCOUNTER — Other Ambulatory Visit: Payer: Self-pay

## 2019-04-25 ENCOUNTER — Ambulatory Visit
Admission: RE | Admit: 2019-04-25 | Discharge: 2019-04-25 | Disposition: A | Discharge status: Home / Self Care | Payer: Self-pay | Source: Ambulatory Visit | Attending: Adult Health | Admitting: Adult Health

## 2019-04-25 DIAGNOSIS — Z170 Estrogen receptor positive status [ER+]: Secondary | ICD-10-CM

## 2019-04-25 DIAGNOSIS — C50412 Malignant neoplasm of upper-outer quadrant of left female breast: Secondary | ICD-10-CM

## 2019-04-25 MED ORDER — GADOBUTROL 1 MMOL/ML IV SOLN
10.0000 mL | Freq: Once | INTRAVENOUS | Status: AC | PRN
  Administered 2019-04-25: 10 mL via INTRAVENOUS

## 2019-04-25 NOTE — Progress Notes (Signed)
Patient Care Team: Chevis Pretty, FNP as PCP - General (Family Medicine) [name], MD as Consulting Physician (General Surgery) Nicholas Lose, MD as Consulting Physician (Hematology and Oncology) Gery Pray, MD as Consulting Physician (Radiation Oncology)  DIAGNOSIS:    ICD-10-CM   1. Carcinoma of upper-outer quadrant of left breast in female, estrogen receptor positive ([place])  C50.412    Z17.0     SUMMARY OF ONCOLOGIC HISTORY: Oncology History  Carcinoma of upper-outer quadrant of left breast in female, estrogen receptor positive ([place])  10/09/2017 Initial Diagnosis   Carcinoma of upper-outer quadrant of left breast in female, estrogen receptor positive ([place])   10/20/2017 Surgery   Left lumpectomy: IDC grade 2, 2.1 cm, margins Negative 0.1 cm to the anterior margin, UDH, 0/3 lymph nodes negative, ER 95%, PR 80%, HER-2 negative, Ki-67 10%, T2N0 stage IA   10/25/2017 Cancer Staging   Staging form: Breast, AJCC 8th Edition - Pathologic: Stage IA (pT2, pN0(sn), cM0, G2, ER+, PR+, HER2-) - Signed by Nicholas Lose, MD on 10/25/2017   11/02/2017 Oncotype testing   Oncotype DX recurrence score 23, distant recurrence at 9 years 9%, no chemo benefit   11/29/2017 - 01/13/2018 Radiation Therapy   Adjuvant radiation therapy   01/2018 -  Anti-estrogen oral therapy   Anastrozole daily     CHIEF COMPLIANT: Follow-up of left breast cancer on anastrozole  INTERVAL HISTORY: Alexis [name] is a 64 y.o. with above-mentioned history of left breast cancer treated with lumpectomy, radiation, and who is currently on antiestrogen therapy with anastrozole. Mammogram on 09/29/18 showed no evidence of malignancy, but imaging was limited due to firmness of left breast and she underwent an MRI on 04/25/19. She presents to the clinic today for follow-up.   ALLERGIES:  is allergic to canagliflozin; empagliflozin; metformin hcl; and parlodel [bromocriptine mesylate].  MEDICATIONS:    Current Outpatient Medications  Medication Sig Dispense Refill  . ACCU-CHEK FASTCLIX LANCETS MISC USE 3 TIMES A DAY AS DIRECTED  3  . ACCU-CHEK GUIDE test strip USE 3 TIMES A DAY AS DIRECTED  3  . anastrozole (ARIMIDEX) 1 MG tablet TAKE 1 TABLET (1 MG TOTAL) BY MOUTH DAILY. 90 tablet 1  . aspirin 81 MG tablet Take 81 mg by mouth daily.     . cetirizine (ZYRTEC) 10 MG tablet Take 10 mg by mouth daily.    . Cholecalciferol (VITAMIN D PO) Take by mouth.    . furosemide (LASIX) 20 MG tablet Take 1 tablet (20 mg total) by mouth 2 (two) times daily. 180 tablet 2  . Insulin Regular Human (HUMULIN R U-500 KWIKPEN [place]) Inject into the skin. Takes 100 am, 70 lunch, 90 dinner    . UNIFINE PENTIPS 31G X 5 MM MISC USE TWICE A DAY AS DIRECTED  2   No current facility-administered medications for this visit.    PHYSICAL EXAMINATION: ECOG PERFORMANCE STATUS: 1 - Symptomatic but completely ambulatory  There were no vitals filed for this visit. There were no vitals filed for this visit.  BREAST: No palpable masses or nodules in either right or left breasts. No palpable axillary supraclavicular or infraclavicular adenopathy no breast tenderness or nipple discharge. (exam performed in the presence of a chaperone)  LABORATORY DATA:  I have reviewed the data as listed CMP Latest Ref Rng & Units 10/20/2018 10/07/2017 12/06/2016  Glucose 65 - 99 mg/dL 136(H) 253(H) 324(H)  BUN 8 - 27 mg/dL 13 13 22(H)  Creatinine 0.57 - 1.00 mg/dL 0.69 0.60 1.09(H)  Sodium 134 - 144 mmol/L 141 138 131(L)  Potassium 3.5 - 5.2 mmol/L 4.1 4.2 4.5  Chloride 96 - 106 mmol/L 103 106 102  CO2 20 - 29 mmol/L 27 24 17(L)  Calcium 8.7 - 10.3 mg/dL 9.1 9.0 9.9  Total Protein 6.0 - 8.5 g/dL 6.5 - 9.0(H)  Total Bilirubin 0.0 - 1.2 mg/dL 0.3 - 1.5(H)  Alkaline Phos 39 - 117 IU/L 89 - 111  AST 0 - 40 IU/L 15 - 20  ALT 0 - 32 IU/L 16 - 25    Lab Results  Component Value Date   WBC 17.2 (H) 12/06/2016   HGB 16.5 (H) 12/06/2016    HCT 51.6 (H) 12/06/2016   MCV 87.6 12/06/2016   PLT 330 12/06/2016   NEUTROABS 14.8 (H) 12/06/2016    ASSESSMENT & PLAN:  Carcinoma of upper-outer quadrant of left breast in female, estrogen receptor positive ([place]) 10/11/2017:Left lumpectomy: IDC grade 2, 2.1 cm, margins Negative 0.1 cm to the anterior margin, UDH, 0/3 lymph nodes negative, ER 95%, PR 80%, HER-2 negative, Ki-67 10%, T2N0 stage IA Oncotype DX recurrence score 23: Risk of distant recurrence at 9 years with hormone therapy alone 9% no chemo benefit Adjuvant radiation therapy 11/29/2017 to 01/13/2018  Treatment plan: Adjuvant antiestrogen therapy with anastrozole 1 mg daily  started 01/30/2018 switched to letrozole 05/18/2019  Anastrozole toxicities: Severe fluid retention for which she takes Lasix twice a day.  This is started about 8 to 9 months ago. Echocardiogram was done in November 2020 which showed an EF of 55 to 60% but there was some diastolic dysfunction noted. I will check with Dr. Haroldine Laws to review the echocardiogram and give his advice. The breast MRI also noted cardiomegaly.  I recommended switching her from anastrozole to letrozole.  She will start letrozole 05/18/2019.  Breast cancer surveillance: 1.  Breast MRI 04/25/2019: No evidence of malignancy. 2.  Breast exam 04/26/2019: Benign 3.  Mammogram will need to be scheduled in September 2021.  Return to clinic in 3 months for virtual visit to discuss tolerance to letrozole therapy.    No orders of the defined types were placed in this encounter.  The patient has a good understanding of the overall plan. she agrees with it. she will call with any problems that may develop before the next visit here.  Total time spent: 30 mins including face to face time and time spent for planning, charting and coordination of care  Nicholas Lose, MD 04/26/2019  I, Cloyde Reams Dorshimer, am acting as scribe for Dr. Nicholas Lose.  I have reviewed the above documentation for  accuracy and completeness, and I agree with the above.

## 2019-04-26 ENCOUNTER — Other Ambulatory Visit: Payer: Self-pay | Admitting: Hematology and Oncology

## 2019-04-26 ENCOUNTER — Other Ambulatory Visit: Payer: Self-pay

## 2019-04-26 ENCOUNTER — Inpatient Hospital Stay: Payer: 59 | Attending: Hematology and Oncology | Admitting: Hematology and Oncology

## 2019-04-26 DIAGNOSIS — Z170 Estrogen receptor positive status [ER+]: Secondary | ICD-10-CM | POA: Insufficient documentation

## 2019-04-26 DIAGNOSIS — C50412 Malignant neoplasm of upper-outer quadrant of left female breast: Secondary | ICD-10-CM | POA: Diagnosis not present

## 2019-04-26 DIAGNOSIS — Z79811 Long term (current) use of aromatase inhibitors: Secondary | ICD-10-CM | POA: Diagnosis not present

## 2019-04-26 MED ORDER — LETROZOLE 2.5 MG PO TABS: 2.5000 mg | ORAL_TABLET | Freq: Every day | ORAL | 3 refills | Status: DC

## 2019-04-26 NOTE — Assessment & Plan Note (Signed)
10/11/2017:Left lumpectomy: IDC grade 2, 2.1 cm, margins Negative 0.1 cm to the anterior margin, UDH, 0/3 lymph nodes negative, ER 95%, PR 80%, HER-2 negative, Ki-67 10%, T2N0 stage IA Oncotype DX recurrence score 23: Risk of distant recurrence at 9 years with hormone therapy alone 9% no chemo benefit Adjuvant radiation therapy 11/29/2017 to 01/13/2018  Treatment plan: Adjuvant antiestrogen therapy with anastrozole 1 mg daily  started 01/30/2018 Anastrozole toxicities:  Breast cancer surveillance: 1.  Breast MRI 04/25/2019: 2.  Breast exam 04/26/2019: 3.  Mammogram  Return to clinic in 1 year for follow-up

## 2019-04-27 ENCOUNTER — Ambulatory Visit: Payer: 59 | Admitting: Nurse Practitioner

## 2019-04-27 ENCOUNTER — Encounter: Payer: Self-pay | Admitting: Nurse Practitioner

## 2019-04-27 ENCOUNTER — Ambulatory Visit: Payer: 59 | Admitting: Physician Assistant

## 2019-04-27 VITALS — BP 133/61 | HR 64 | Temp 97.7°F | Resp 20 | Ht 64.0 in | Wt 222.0 lb

## 2019-04-27 DIAGNOSIS — I890 Lymphedema, not elsewhere classified: Secondary | ICD-10-CM | POA: Diagnosis not present

## 2019-04-27 DIAGNOSIS — M7989 Other specified soft tissue disorders: Secondary | ICD-10-CM | POA: Diagnosis not present

## 2019-04-27 DIAGNOSIS — E210 Primary hyperparathyroidism: Secondary | ICD-10-CM | POA: Diagnosis not present

## 2019-04-27 DIAGNOSIS — E119 Type 2 diabetes mellitus without complications: Secondary | ICD-10-CM | POA: Diagnosis not present

## 2019-04-27 DIAGNOSIS — Z794 Long term (current) use of insulin: Secondary | ICD-10-CM | POA: Diagnosis not present

## 2019-04-27 DIAGNOSIS — E785 Hyperlipidemia, unspecified: Secondary | ICD-10-CM | POA: Diagnosis not present

## 2019-04-27 DIAGNOSIS — I10 Essential (primary) hypertension: Secondary | ICD-10-CM | POA: Diagnosis not present

## 2019-04-27 DIAGNOSIS — I152 Hypertension secondary to endocrine disorders: Secondary | ICD-10-CM

## 2019-04-27 DIAGNOSIS — E1169 Type 2 diabetes mellitus with other specified complication: Secondary | ICD-10-CM | POA: Diagnosis not present

## 2019-04-27 DIAGNOSIS — C50412 Malignant neoplasm of upper-outer quadrant of left female breast: Secondary | ICD-10-CM

## 2019-04-27 DIAGNOSIS — Z170 Estrogen receptor positive status [ER+]: Secondary | ICD-10-CM

## 2019-04-27 DIAGNOSIS — E1159 Type 2 diabetes mellitus with other circulatory complications: Secondary | ICD-10-CM | POA: Diagnosis not present

## 2019-04-27 LAB — BAYER DCA HB A1C WAIVED: HB A1C (BAYER DCA - WAIVED): 7.4 % — ABNORMAL HIGH (ref <7.0)

## 2019-04-27 MED ORDER — FUROSEMIDE 20 MG PO TABS: 20.0000 mg | ORAL_TABLET | Freq: Two times a day (BID) | ORAL | 1 refills | Status: DC

## 2019-04-27 NOTE — Patient Instructions (Signed)
Diabetes Mellitus and Foot Care Foot care is an important part of your health, especially when you have diabetes. Diabetes may cause you to have problems because of poor blood flow (circulation) to your feet and legs, which can cause your skin to:  Become thinner and drier.  Break more easily.  Heal more slowly.  Peel and crack. You may also have nerve damage (neuropathy) in your legs and feet, causing decreased feeling in them. This means that you may not notice minor injuries to your feet that could lead to more serious problems. Noticing and addressing any potential problems early is the best way to prevent future foot problems. How to care for your feet Foot hygiene  Wash your feet daily with warm water and mild soap. Do not use hot water. Then, pat your feet and the areas between your toes until they are completely dry. Do not soak your feet as this can dry your skin.  Trim your toenails straight across. Do not dig under them or around the cuticle. File the edges of your nails with an emery board or nail file.  Apply a moisturizing lotion or petroleum jelly to the skin on your feet and to dry, brittle toenails. Use lotion that does not contain alcohol and is unscented. Do not apply lotion between your toes. Shoes and socks  Wear clean socks or stockings every day. Make sure they are not too tight. Do not wear knee-high stockings since they may decrease blood flow to your legs.  Wear shoes that fit properly and have enough cushioning. Always look in your shoes before you put them on to be sure there are no objects inside.  To break in new shoes, wear them for just a few hours a day. This prevents injuries on your feet. Wounds, scrapes, corns, and calluses  Check your feet daily for blisters, cuts, bruises, sores, and redness. If you cannot see the bottom of your feet, use a mirror or ask someone for help.  Do not cut corns or calluses or try to remove them with medicine.  If you  find a minor scrape, cut, or break in the skin on your feet, keep it and the skin around it clean and dry. You may clean these areas with mild soap and water. Do not clean the area with peroxide, alcohol, or iodine.  If you have a wound, scrape, corn, or callus on your foot, look at it several times a day to make sure it is healing and not infected. Check for: ? Redness, swelling, or pain. ? Fluid or blood. ? Warmth. ? Pus or a bad smell. General instructions  Do not cross your legs. This may decrease blood flow to your feet.  Do not use heating pads or hot water bottles on your feet. They may burn your skin. If you have lost feeling in your feet or legs, you may not know this is happening until it is too late.  Protect your feet from hot and cold by wearing shoes, such as at the beach or on hot pavement.  Schedule a complete foot exam at least once a year (annually) or more often if you have foot problems. If you have foot problems, report any cuts, sores, or bruises to your health care provider immediately. Contact a health care provider if:  You have a medical condition that increases your risk of infection and you have any cuts, sores, or bruises on your feet.  You have an injury that is not   healing.  You have redness on your legs or feet.  You feel burning or tingling in your legs or feet.  You have pain or cramps in your legs and feet.  Your legs or feet are numb.  Your feet always feel cold.  You have pain around a toenail. Get help right away if:  You have a wound, scrape, corn, or callus on your foot and: ? You have pain, swelling, or redness that gets worse. ? You have fluid or blood coming from the wound, scrape, corn, or callus. ? Your wound, scrape, corn, or callus feels warm to the touch. ? You have pus or a bad smell coming from the wound, scrape, corn, or callus. ? You have a fever. ? You have a red line going up your leg. Summary  Check your feet every day  for cuts, sores, red spots, swelling, and blisters.  Moisturize feet and legs daily.  Wear shoes that fit properly and have enough cushioning.  If you have foot problems, report any cuts, sores, or bruises to your health care provider immediately.  Schedule a complete foot exam at least once a year (annually) or more often if you have foot problems. This information is not intended to replace advice given to you by your health care provider. Make sure you discuss any questions you have with your health care provider. Document Revised: 09/20/2018 Document Reviewed: 01/30/2016 Elsevier Patient Education  2020 [employer]  

## 2019-04-27 NOTE — Progress Notes (Signed)
Subjective:    Patient ID: Alexis [name], female    DOB: [birth year], 64 y.o.   MRN: [id number]   Chief Complaint: Medical Management of Chronic Issues    HPI:  1. Type 2 diabetes mellitus without complication, with long-term current use of insulin ([place]) Followed by Dr. Buddy Duty with endocrinology and he adjusts her medication. Has an appt scheduled in June 2021. Checks blood sugar daily and is usually around 130s. Denies numbness, tingling in feet, blurry vision. Gets regular eye exams. Denies low blood sugars. Is trying to watch carb intake and is trying to walk during her lunch break. Lab Results  Component Value Date   HGBA1C 8.4 (H) 02/23/2015    2. Hypertension associated with type 2 diabetes mellitus ([place]) Does not check BP at home. Her meter broke and she has not replaced it yet. Is watching sodium intake in diet. Denies chest pain, dizziness. Does have SOB but has had that since radiation therapy for breast cancer. Had an Echo done 12/05/18 but cardiologist has not been in touch with her with the results.  BP Readings from Last 3 Encounters:  04/27/19 133/61  04/26/19 (!) 146/66  12/04/18 (!) 144/51    3. Hyperlipidemia associated with type 2 diabetes mellitus (HCC) Is watching fat and cholesterol intake. Does do some walking. Is not on medication for high cholesterol. Was on atorvastatin at one point but discontinued for joint pain.   4. Lymphedema Has some swelling of left arm, abdomen, and bilateral legs.  5. Swelling of lower extremity Wears compression socks that help with the swelling and elevates legs when sitting.  6. Primary hyperparathyroidism ([place]) Followed by Dr. Buddy Duty with endocrinology. Took her off synthroid after having tumor removed from parathyroid.  7. Carcinoma of upper-outer quadrant of left breast in female, estrogen receptor positive ([place]) Followed by oncologist Dr. Lindi Adie. Had lumpectomy and removed 3 lymph nodes and then did 8 weeks of  radiation that she finished in January 2020. Mammogram done in September 2020. Breast MRI done 04/25/19 that was good. Dr. Lindi Adie sent another referral to cardiology because MRI did show some cardiomegaly.    Outpatient Encounter Medications as of 04/27/2019  Medication Sig  . ACCU-CHEK FASTCLIX LANCETS MISC USE 3 TIMES A DAY AS DIRECTED  . ACCU-CHEK GUIDE test strip USE 3 TIMES A DAY AS DIRECTED  . aspirin 81 MG tablet Take 81 mg by mouth daily.   . cetirizine (ZYRTEC) 10 MG tablet Take 10 mg by mouth daily.  . Cholecalciferol (VITAMIN D PO) Take by mouth.  . Insulin Regular Human (HUMULIN R U-500 KWIKPEN [place]) Inject into the skin. Takes 100 am, 70 lunch, 90 dinner  . letrozole (FEMARA) 2.5 MG tablet Take 1 tablet (2.5 mg total) by mouth daily.  Marland Kitchen UNIFINE PENTIPS 31G X 5 MM MISC USE TWICE A DAY AS DIRECTED  . furosemide (LASIX) 20 MG tablet Take 1 tablet (20 mg total) by mouth 2 (two) times daily.   No facility-administered encounter medications on file as of 04/27/2019.    Past Surgical History:  Procedure Laterality Date  . ABDOMINAL HYSTERECTOMY    . APPENDECTOMY    . BREAST LUMPECTOMY Left 10/11/2017  . CARPAL TUNNEL RELEASE     right and left   . CESAREAN SECTION    . CHOLECYSTECTOMY    . COLON SURGERY    . colonscopy     . DILATION AND CURETTAGE OF UTERUS    . HYSTEROSCOPY  2003  . LAVH  2004   leiomyoma/irregular bleeding  . PARATHYROIDECTOMY N/A 02/21/2015   Procedure: RIGHT PARATHYROIDECTOMY;  Surgeon: [name], MD;  Location: [facility];  Service: General;  Laterality: N/A;  . PARTIAL MASTECTOMY WITH AXILLARY SENTINEL LYMPH NODE BIOPSY Left 10/11/2017   Procedure: LEFT BREAST PARTIAL MASTECTOMY WITH AXILLARY SENTINEL LYMPH NODE BIOPSY;  Surgeon: [name], MD;  Location: [facility];  Service: General;  Laterality: Left;  . TUBAL LIGATION      Family History  Problem Relation Age of Onset  . Heart attack Father   . Diabetes Brother   . Cancer Brother         Lung cancer  . Hyperlipidemia Mother   . Hypertension Mother     New complaints: None  Social history: Daughter and her children live with her.  Controlled substance contract: N/A     Review of Systems  Constitutional: Negative.   HENT: Negative.   Eyes: Negative.   Respiratory: Positive for shortness of breath.   Cardiovascular: Positive for leg swelling (more on the left side).  Gastrointestinal: Positive for abdominal distention (swelling in abdomen).  Endocrine: Negative.   Genitourinary: Negative.   Musculoskeletal: Negative.   Skin: Negative.   Allergic/Immunologic: Negative.   Neurological: Negative.   Psychiatric/Behavioral: Negative.        Objective:   Physical Exam Vitals and nursing note reviewed.  Constitutional:      Appearance: Normal appearance.  HENT:     Head: Normocephalic.     Right Ear: Tympanic membrane normal.     Left Ear: Tympanic membrane normal.     Nose: Nose normal.     Mouth/Throat:     Mouth: Mucous membranes are moist.     Pharynx: Oropharynx is clear.  Cardiovascular:     Rate and Rhythm: Normal rate and regular rhythm.     Pulses: Normal pulses.     Heart sounds: Normal heart sounds.  Pulmonary:     Effort: Pulmonary effort is normal.     Breath sounds: Normal breath sounds.  Abdominal:     General: Bowel sounds are normal.     Palpations: Abdomen is soft.  Musculoskeletal:        General: Swelling (left arm) present. Normal range of motion.     Cervical back: Normal range of motion and neck supple.     Right lower leg: Edema (1+) present.     Left lower leg: Edema (2+) present.  Skin:    General: Skin is warm and dry.     Capillary Refill: Capillary refill takes less than 2 seconds.  Neurological:     Mental Status: She is alert and oriented to person, place, and time.    BP 133/61   Pulse 64   Temp 97.7 F (36.5 C) (Temporal)   Resp 20   Ht '5\' 4"'$  (1.626 m)   Wt 222 lb (100.7 kg)   BMI 38.11 kg/m        Assessment & Plan:  Alexis [name] comes in today with chief complaint of Medical Management of Chronic Issues   Diagnosis and orders addressed:  1. Type 2 diabetes mellitus without complication, with long-term current use of insulin ([place]) Follow up with endocrinologist as scheduled. Watch carbs in diet. Make appointment for eye exam. - Bayer DCA Hb A1c Waived - Microalbumin / creatinine urine ratio - Referral to Nutrition and Diabetes Services  2. Hypertension associated with type 2 diabetes mellitus (HCC) Low sodium diet. - CBC with  Differential/Platelet - CMP14+EGFR - furosemide (LASIX) 20 MG tablet; Take 1 tablet (20 mg total) by mouth 2 (two) times daily.  Dispense: 180 tablet; Refill: 1 - CBC with Differential/Platelet - CMP14+EGFR  3. Hyperlipidemia associated with type 2 diabetes mellitus (HCC) Low fat diet. - Lipid panel - Lipid panel  4. Lymphedema Follow up with cardiology as referred.  5. Swelling of lower extremity Elevate legs when sitting. Wear compression socks. Follow up with cardiology. - furosemide (LASIX) 20 MG tablet; Take 1 tablet (20 mg total) by mouth 2 (two) times daily.  Dispense: 180 tablet; Refill: 1  6. Primary hyperparathyroidism ([place]) Follow up with endocrinology as scheduled.   7. Carcinoma of upper-outer quadrant of left breast in female, estrogen receptor positive ([place]) Follow up with oncologist as scheduled.   Labs pending Health Maintenance reviewed Diet and exercise encouraged  Follow up plan: 6 months   Mary-Xuan Hassell Done, FNP

## 2019-04-28 LAB — CBC WITH DIFFERENTIAL/PLATELET
Basophils Absolute: 0 10*3/uL (ref 0.0–0.2)
Basos: 1 %
EOS (ABSOLUTE): 0.1 10*3/uL (ref 0.0–0.4)
Eos: 2 %
Hematocrit: 44.6 % (ref 34.0–46.6)
Hemoglobin: 15 g/dL (ref 11.1–15.9)
Immature Grans (Abs): 0 10*3/uL (ref 0.0–0.1)
Immature Granulocytes: 0 %
Lymphocytes Absolute: 2.1 10*3/uL (ref 0.7–3.1)
Lymphs: 27 %
MCH: 28.7 pg (ref 26.6–33.0)
MCHC: 33.6 g/dL (ref 31.5–35.7)
MCV: 85 fL (ref 79–97)
Monocytes Absolute: 0.7 10*3/uL (ref 0.1–0.9)
Monocytes: 8 %
Neutrophils Absolute: 4.8 10*3/uL (ref 1.4–7.0)
Neutrophils: 62 %
Platelets: 308 10*3/uL (ref 150–450)
RBC: 5.22 x10E6/uL (ref 3.77–5.28)
RDW: 13.3 % (ref 11.7–15.4)
WBC: 7.8 10*3/uL (ref 3.4–10.8)

## 2019-04-28 LAB — LIPID PANEL
Chol/HDL Ratio: 3.7 ratio (ref 0.0–4.4)
Cholesterol, Total: 202 mg/dL — ABNORMAL HIGH (ref 100–199)
HDL: 54 mg/dL (ref >39)
LDL Chol Calc (NIH): 126 mg/dL — ABNORMAL HIGH (ref 0–99)
Triglycerides: 126 mg/dL (ref 0–149)
VLDL Cholesterol Cal: 22 mg/dL (ref 5–40)

## 2019-04-28 LAB — CMP14+EGFR
ALT: 20 IU/L (ref 0–32)
AST: 16 IU/L (ref 0–40)
Albumin/Globulin Ratio: 1.7 (ref 1.2–2.2)
Albumin: 4.3 g/dL (ref 3.8–4.8)
Alkaline Phosphatase: 98 IU/L (ref 39–117)
BUN/Creatinine Ratio: 19 (ref 12–28)
BUN: 14 mg/dL (ref 8–27)
Bilirubin Total: 0.4 mg/dL (ref 0.0–1.2)
CO2: 23 mmol/L (ref 20–29)
Calcium: 9.4 mg/dL (ref 8.7–10.3)
Chloride: 103 mmol/L (ref 96–106)
Creatinine, Ser: 0.73 mg/dL (ref 0.57–1.00)
GFR calc Af Amer: 101 mL/min/{1.73_m2} (ref >59)
GFR calc non Af Amer: 87 mL/min/{1.73_m2} (ref >59)
Globulin, Total: 2.6 g/dL (ref 1.5–4.5)
Glucose: 130 mg/dL — ABNORMAL HIGH (ref 65–99)
Potassium: 4.4 mmol/L (ref 3.5–5.2)
Sodium: 139 mmol/L (ref 134–144)
Total Protein: 6.9 g/dL (ref 6.0–8.5)

## 2019-04-28 LAB — MICROALBUMIN / CREATININE URINE RATIO
Creatinine, Urine: 119.9 mg/dL
Microalb/Creat Ratio: 5 mg/g creat (ref 0–29)
Microalbumin, Urine: 5.7 ug/mL

## 2019-04-30 ENCOUNTER — Telehealth: Payer: Self-pay | Admitting: Hematology and Oncology

## 2019-04-30 NOTE — Telephone Encounter (Signed)
Schedule per 04/15 los, patient has been called and notified.

## 2019-04-30 NOTE — Telephone Encounter (Signed)
I informed her that after speaking with Dr. Haroldine Laws that there is no issues around cardiac function.  I suspect the swelling is related to her antiestrogen therapy and therefore she will pursue the plan to switch her from anastrozole to letrozole starting May 2021.

## 2019-06-07 DIAGNOSIS — H524 Presbyopia: Secondary | ICD-10-CM | POA: Diagnosis not present

## 2019-06-12 DIAGNOSIS — E538 Deficiency of other specified B group vitamins: Secondary | ICD-10-CM | POA: Diagnosis not present

## 2019-06-12 DIAGNOSIS — E1165 Type 2 diabetes mellitus with hyperglycemia: Secondary | ICD-10-CM | POA: Diagnosis not present

## 2019-06-12 DIAGNOSIS — Z8639 Personal history of other endocrine, nutritional and metabolic disease: Secondary | ICD-10-CM | POA: Diagnosis not present

## 2019-06-12 DIAGNOSIS — E785 Hyperlipidemia, unspecified: Secondary | ICD-10-CM | POA: Diagnosis not present

## 2019-06-14 ENCOUNTER — Other Ambulatory Visit (HOSPITAL_COMMUNITY): Payer: Self-pay | Admitting: Internal Medicine

## 2019-06-15 ENCOUNTER — Telehealth: Payer: Self-pay | Admitting: Nurse Practitioner

## 2019-06-15 DIAGNOSIS — E559 Vitamin D deficiency, unspecified: Secondary | ICD-10-CM | POA: Diagnosis not present

## 2019-06-15 DIAGNOSIS — E039 Hypothyroidism, unspecified: Secondary | ICD-10-CM | POA: Diagnosis not present

## 2019-06-15 DIAGNOSIS — C50919 Malignant neoplasm of unspecified site of unspecified female breast: Secondary | ICD-10-CM | POA: Diagnosis not present

## 2019-06-15 DIAGNOSIS — E109 Type 1 diabetes mellitus without complications: Secondary | ICD-10-CM | POA: Diagnosis not present

## 2019-06-15 DIAGNOSIS — M5136 Other intervertebral disc degeneration, lumbar region: Secondary | ICD-10-CM | POA: Diagnosis not present

## 2019-06-15 NOTE — Telephone Encounter (Signed)
Pt is requesting a copy of her last labs.

## 2019-06-15 NOTE — Telephone Encounter (Signed)
Printed of labs and put in drawer

## 2019-06-18 ENCOUNTER — Ambulatory Visit: Payer: 59 | Admitting: Registered"

## 2019-06-22 ENCOUNTER — Encounter: Payer: 59 | Attending: Nurse Practitioner | Admitting: Registered"

## 2019-06-22 ENCOUNTER — Other Ambulatory Visit: Payer: Self-pay

## 2019-06-22 DIAGNOSIS — E119 Type 2 diabetes mellitus without complications: Secondary | ICD-10-CM | POA: Insufficient documentation

## 2019-06-22 DIAGNOSIS — Z794 Long term (current) use of insulin: Secondary | ICD-10-CM | POA: Insufficient documentation

## 2019-06-22 NOTE — Progress Notes (Signed)
Diabetes Self-Management Education  Visit Type: First/Initial  Appt. Start Time: 1045 Appt. End Time: 2951  06/22/2019  [name], identified by name and date of birth, is a 64 y.o. female with a diagnosis of Diabetes: Type 2.   ASSESSMENT  There were no vitals taken for this visit. There is no height or weight on file to calculate BMI.   Pt states she has gained 20 lbs last year and states her health care providers have said it is from hormone tx after having radiation Tx for breast cancer Nov 2019 to Jan 2020. Pt reports much of the weight could be from fluid and she is taking fluid pill. Pt states she has been tired a lot since having radiation Tx.    Pt reports she started using Dexcom CGM this week and Dr. Buddy Duty will download data at her next visit. Pt states she likes being able to see the immediate effect some foods have on her blood sugar such as a few sips of lemonade and a little piece of cake.  Pt denies blood sugar going below 70, but states she feels light headed when blood sugar was 89 mg/dL. Patient states she ate a little bit of hard candy and brought up to 120 mg/dL and resolved lightheadedness.  Pt reports due to elevated FBS, her MD  Increased dinner Humulin 10 units for a total of 100 units and appears to be working to lower FBS. Pt reports she checks blood sugar at night when feeling hot and CGM indicates BG is going up.  Patient wanted a review of carb counting and she will start entering in her carb intake into her CGM monitor.   Diabetes Self-Management Education - 06/22/19 1058      Visit Information   Visit Type First/Initial      Initial Visit   Diabetes Type Type 2    Are you currently following a meal plan? No    Are you taking your medications as prescribed? Yes   Humulin R   Date Diagnosed 2014      Health Coping   How would you rate your overall health? Fair      Psychosocial Assessment   Patient Belief/Attitude about Diabetes Afraid     How often do you need to have someone help you when you read instructions, pamphlets, or other written materials from your doctor or pharmacy? 1 - Never    What is the last grade level you completed in school? some college      Complications   Last HgB A1C per patient/outside source 7.4 %    How often do you check your blood sugar? 3-4 times / week   CGM   Fasting Blood glucose range (mg/dL) 70-129;130-179   101 - 150   Number of hypoglycemic episodes per month 0   has sx at 89   Have you had a dilated eye exam in the past 12 months? Yes    Have you had a dental exam in the past 12 months? Yes    Are you checking your feet? Yes    How many days per week are you checking your feet? 7      Dietary Intake   Breakfast scrambled eggs    Snack (morning) Peanut butter OR crackers    Lunch salad, chicken skewers, roasted vegetables    Snack (afternoon) none    Dinner 4 chicken nuggets, banana, salad    Snack (evening) none    Beverage(s) water,  unsweet tea      Exercise   Exercise Type ADL's    How many days per week to you exercise? 0    How many minutes per day do you exercise? 0    Total minutes per week of exercise 0      Patient Education   Previous Diabetes Education Yes (please comment)    Nutrition management  Role of diet in the treatment of diabetes and the relationship between the three main macronutrients and blood glucose level;Carbohydrate counting    Physical activity and exercise  Role of exercise on diabetes management, blood pressure control and cardiac health.      Individualized Goals (developed by patient)   Nutrition General guidelines for healthy choices and portions discussed    Physical Activity Exercise 3-5 times per week    Medications take my medication as prescribed      Outcomes   Expected Outcomes Demonstrated interest in learning. Expect positive outcomes    Future DMSE PRN    Program Status Completed           Individualized Plan for Diabetes  Self-Management Training:   Learning Objective:  Patient will have a greater understanding of diabetes self-management. Patient education plan is to attend individual and/or group sessions per assessed needs and concerns.  Patient Instructions  To help improve your diet: Continue getting healthy options for the lunches at work. You don't need to cut out the carb completely. Use snack time to get more vegetables and fruit (ex apples & peanut butter) Include beans often for more fiber A 10-15 min brisk walk may help with bringing down your morning blood sugar Continue walking the circle at work. Remember to take your bands to work for extra movement Consider doing more stretching may also help with back pain   Expected Outcomes:  Demonstrated interest in learning. Expect positive outcomes  Education material provided: Carbohydrate counting sheet  If problems or questions, patient to contact team via:  Phone and MyChart  Future DSME appointment: PRN

## 2019-06-22 NOTE — Patient Instructions (Addendum)
To help improve your diet: Continue getting healthy options for the lunches at work. You don't need to cut out the carb completely. Use snack time to get more vegetables and fruit (ex apples & peanut butter) Include beans often for more fiber A 10-15 min brisk walk may help with bringing down your morning blood sugar Continue walking the circle at work. Remember to take your bands to work for extra movement Consider doing more stretching may also help with back pain

## 2019-07-02 DIAGNOSIS — E1165 Type 2 diabetes mellitus with hyperglycemia: Secondary | ICD-10-CM | POA: Diagnosis not present

## 2019-07-10 DIAGNOSIS — R300 Dysuria: Secondary | ICD-10-CM | POA: Diagnosis not present

## 2019-07-26 ENCOUNTER — Telehealth: Payer: Self-pay | Admitting: Hematology and Oncology

## 2019-07-27 ENCOUNTER — Inpatient Hospital Stay: Payer: 59 | Attending: Hematology and Oncology | Admitting: Hematology and Oncology

## 2019-07-27 DIAGNOSIS — Z170 Estrogen receptor positive status [ER+]: Secondary | ICD-10-CM | POA: Diagnosis not present

## 2019-07-27 DIAGNOSIS — C50412 Malignant neoplasm of upper-outer quadrant of left female breast: Secondary | ICD-10-CM | POA: Insufficient documentation

## 2019-07-27 MED ORDER — POTASSIUM CHLORIDE CRYS ER 20 MEQ PO TBCR: 20.0000 meq | EXTENDED_RELEASE_TABLET | Freq: Two times a day (BID) | ORAL | 1 refills | Status: DC

## 2019-07-27 NOTE — Progress Notes (Signed)
HEMATOLOGY-ONCOLOGY MYCHART VIDEO VISIT PROGRESS NOTE  I connected with Alexis [name] on 07/27/2019 at 11:45 AM EDT by MyChart video conference and verified that I am speaking with the correct person using two identifiers.  I discussed the limitations, risks, security and privacy concerns of performing an evaluation and management service by MyChart and the availability of in person appointments.  I also discussed with the patient that there may be a patient responsible charge related to this service. The patient expressed understanding and agreed to proceed.  Patient's Location: Home Physician Location: Clinic  CHIEF COMPLIANT: Follow-up of left breast cancer on letrozole  INTERVAL HISTORY: Alexis [name] is a 64 y.o. female with above-mentioned history of left breast cancer treated with lumpectomy, radiation, and who is currently on antiestrogen therapy with letrozole, after switching from anastrozole. She presents over MyChart today for a toxicity check.  She continues to suffer from fluid gain and retention and in spite of taking Lasix twice a day, her symptoms are persistent.  Oncology History  Carcinoma of upper-outer quadrant of left breast in female, estrogen receptor positive ([place])  10/09/2017 Initial Diagnosis   Carcinoma of upper-outer quadrant of left breast in female, estrogen receptor positive ([place])   10/20/2017 Surgery   Left lumpectomy: IDC grade 2, 2.1 cm, margins Negative 0.1 cm to the anterior margin, UDH, 0/3 lymph nodes negative, ER 95%, PR 80%, HER-2 negative, Ki-67 10%, T2N0 stage IA   10/25/2017 Cancer Staging   Staging form: Breast, AJCC 8th Edition - Pathologic: Stage IA (pT2, pN0(sn), cM0, G2, ER+, PR+, HER2-) - Signed by Nicholas Lose, MD on 10/25/2017   11/02/2017 Oncotype testing   Oncotype DX recurrence score 23, distant recurrence at 9 years 9%, no chemo benefit   11/29/2017 - 01/13/2018 Radiation Therapy   Adjuvant radiation therapy     01/2018 -  Anti-estrogen oral therapy   Anastrozole daily, switched to letrozole 05/18/19    Observations/Objective:  There were no vitals filed for this visit. There is no height or weight on file to calculate BMI.  I have reviewed the data as listed CMP Latest Ref Rng & Units 04/27/2019 10/20/2018 10/07/2017  Glucose 65 - 99 mg/dL 130(H) 136(H) 253(H)  BUN 8 - 27 mg/dL '14 13 13  '$ Creatinine 0.57 - 1.00 mg/dL 0.73 0.69 0.60  Sodium 134 - 144 mmol/L 139 141 138  Potassium 3.5 - 5.2 mmol/L 4.4 4.1 4.2  Chloride 96 - 106 mmol/L 103 103 106  CO2 20 - 29 mmol/L '23 27 24  '$ Calcium 8.7 - 10.3 mg/dL 9.4 9.1 9.0  Total Protein 6.0 - 8.5 g/dL 6.9 6.5 -  Total Bilirubin 0.0 - 1.2 mg/dL 0.4 0.3 -  Alkaline Phos 39 - 117 IU/L 98 89 -  AST 0 - 40 IU/L 16 15 -  ALT 0 - 32 IU/L 20 16 -    Lab Results  Component Value Date   WBC 7.8 04/27/2019   HGB 15.0 04/27/2019   HCT 44.6 04/27/2019   MCV 85 04/27/2019   PLT 308 04/27/2019   NEUTROABS 4.8 04/27/2019    Assessment Plan:  Carcinoma of upper-outer quadrant of left breast in female, estrogen receptor positive ([place]) 10/11/2017:Left lumpectomy: IDC grade 2, 2.1 cm, margins Negative 0.1 cm to the anterior margin, UDH, 0/3 lymph nodes negative, ER 95%, PR 80%, HER-2 negative, Ki-67 10%, T2N0 stage IA Oncotype DX recurrence score 23: Risk of distant recurrence at 9 years with hormone therapy alone 9% no chemo benefit Adjuvant  radiation therapy 11/19/2019to01/03/2018  Treatment plan: Adjuvant antiestrogen therapy with anastrozole 1 mg daily  started 01/30/2018 switched to letrozole 05/18/2019 Severe fluid retention for which she takes Lasix twice a day. In spite of this she is not losing weight and continues to struggle with the fluid retention. She has changed her diet and lifestyle in spite of that she has not lost weight.  Plan: Increase the Lasix to 40 twice daily and add potassium 40 mEq daily. We will see her back in 2 weeks with labs and  follow-up to assess her electrolytes and fluid balance.  Echocardiogram was done in November 2020 which showed an EF of 55 to 60%   Breast cancer surveillance: 1.  Breast MRI 04/25/2019: No evidence of malignancy. 2.  Breast exam 04/26/2019: Benign 3.  Mammogram will need to be scheduled in September 2021.  Return to clinic in 2 weeks with labs and follow-up    I discussed the assessment and treatment plan with the patient. The patient was provided an opportunity to ask questions and all were answered. The patient agreed with the plan and demonstrated an understanding of the instructions. The patient was advised to call back or seek an in-person evaluation if the symptoms worsen or if the condition fails to improve as anticipated.   I provided 20 minutes of face-to-face MyChart video visit time during this encounter.    Rulon Eisenmenger, MD 07/27/2019   I, Molly Dorshimer, am acting as scribe for Nicholas Lose, MD.  I have reviewed the above documentation for accuracy and completeness, and I agree with the above.

## 2019-07-27 NOTE — Assessment & Plan Note (Signed)
10/11/2017:Left lumpectomy: IDC grade 2, 2.1 cm, margins Negative 0.1 cm to the anterior margin, UDH, 0/3 lymph nodes negative, ER 95%, PR 80%, HER-2 negative, Ki-67 10%, T2N0 stage IA Oncotype DX recurrence score 23: Risk of distant recurrence at 9 years with hormone therapy alone 9% no chemo benefit Adjuvant radiation therapy 11/19/2019to01/03/2018  Treatment plan: Adjuvant antiestrogen therapy with anastrozole 1 mg daily  started 01/30/2018 switched to letrozole 05/18/2019 Severe fluid retention for which she takes Lasix twice a day.  Letrozole toxicities:      Echocardiogram was done in November 2020 which showed an EF of 55 to 60%   Breast cancer surveillance: 1.  Breast MRI 04/25/2019: No evidence of malignancy. 2.  Breast exam 04/26/2019: Benign 3.  Mammogram will need to be scheduled in September 2021.  Return to clinic in 1 year for follow up

## 2019-08-06 ENCOUNTER — Telehealth: Payer: Self-pay | Admitting: Hematology and Oncology

## 2019-08-06 NOTE — Telephone Encounter (Signed)
Scheduled appt per sch msg. Called and spoke with patient. Confirmed appt  

## 2019-08-13 NOTE — Progress Notes (Signed)
Patient Care Team: Chevis Pretty, FNP as PCP - General (Family Medicine) [name], MD as Consulting Physician (General Surgery) Nicholas Lose, MD as Consulting Physician (Hematology and Oncology) Gery Pray, MD as Consulting Physician (Radiation Oncology)  DIAGNOSIS:    ICD-10-CM   1. Carcinoma of upper-outer quadrant of left breast in female, estrogen receptor positive ([place])  C50.412    Z17.0     SUMMARY OF ONCOLOGIC HISTORY: Oncology History  Carcinoma of upper-outer quadrant of left breast in female, estrogen receptor positive ([place])  10/09/2017 Initial Diagnosis   Carcinoma of upper-outer quadrant of left breast in female, estrogen receptor positive ([place])   10/20/2017 Surgery   Left lumpectomy: IDC grade 2, 2.1 cm, margins Negative 0.1 cm to the anterior margin, UDH, 0/3 lymph nodes negative, ER 95%, PR 80%, HER-2 negative, Ki-67 10%, T2N0 stage IA   10/25/2017 Cancer Staging   Staging form: Breast, AJCC 8th Edition - Pathologic: Stage IA (pT2, pN0(sn), cM0, G2, ER+, PR+, HER2-) - Signed by Nicholas Lose, MD on 10/25/2017   11/02/2017 Oncotype testing   Oncotype DX recurrence score 23, distant recurrence at 9 years 9%, no chemo benefit   11/29/2017 - 01/13/2018 Radiation Therapy   Adjuvant radiation therapy   01/2018 -  Anti-estrogen oral therapy   Anastrozole daily, switched to letrozole 05/18/19     CHIEF COMPLIANT: Follow-upof left breast cancer on letrozole  INTERVAL HISTORY: Alexis [name] is a 64 y.o. with above-mentioned history of left breast cancer treated with lumpectomy,radiation, and who is currently on antiestrogen therapy with letrozole.She presents to the clinic todayfor follow-up.    ALLERGIES:  is allergic to canagliflozin, empagliflozin, metformin hcl, and parlodel [bromocriptine mesylate].  MEDICATIONS:  Current Outpatient Medications  Medication Sig Dispense Refill  . ACCU-CHEK FASTCLIX LANCETS MISC USE 3 TIMES A DAY AS  DIRECTED  3  . ACCU-CHEK GUIDE test strip USE 3 TIMES A DAY AS DIRECTED  3  . aspirin 81 MG tablet Take 81 mg by mouth daily.     . cetirizine (ZYRTEC) 10 MG tablet Take 10 mg by mouth daily.    . Cholecalciferol (VITAMIN D PO) Take by mouth.    . furosemide (LASIX) 20 MG tablet Take 1 tablet (20 mg total) by mouth 2 (two) times daily. 180 tablet 1  . Insulin Regular Human (HUMULIN R U-500 KWIKPEN [place]) Inject into the skin. Takes 100 am, 70 lunch, 100 dinner    . letrozole (FEMARA) 2.5 MG tablet Take 1 tablet (2.5 mg total) by mouth daily. 90 tablet 3  . potassium chloride SA (KLOR-CON) 20 MEQ tablet Take 1 tablet (20 mEq total) by mouth 2 (two) times daily. 60 tablet 1  . rosuvastatin (CRESTOR) 5 MG tablet     . UNIFINE PENTIPS 31G X 5 MM MISC USE TWICE A DAY AS DIRECTED  2   No current facility-administered medications for this visit.    PHYSICAL EXAMINATION: ECOG PERFORMANCE STATUS: 1 - Symptomatic but completely ambulatory  Vitals:   08/14/19 0821  BP: (!) 123/48  Pulse: 67  Resp: 16  Temp: 98.9 F (37.2 C)  SpO2: 97%   Filed Weights   08/14/19 0821  Weight: 224 lb 9.6 oz (101.9 kg)    LABORATORY DATA:  I have reviewed the data as listed CMP Latest Ref Rng & Units 04/27/2019 10/20/2018 10/07/2017  Glucose 65 - 99 mg/dL 130(H) 136(H) 253(H)  BUN 8 - 27 mg/dL '14 13 13  '$ Creatinine 0.57 - 1.00 mg/dL 0.73 0.69  0.60  Sodium 134 - 144 mmol/L 139 141 138  Potassium 3.5 - 5.2 mmol/L 4.4 4.1 4.2  Chloride 96 - 106 mmol/L 103 103 106  CO2 20 - 29 mmol/L '23 27 24  '$ Calcium 8.7 - 10.3 mg/dL 9.4 9.1 9.0  Total Protein 6.0 - 8.5 g/dL 6.9 6.5 -  Total Bilirubin 0.0 - 1.2 mg/dL 0.4 0.3 -  Alkaline Phos 39 - 117 IU/L 98 89 -  AST 0 - 40 IU/L 16 15 -  ALT 0 - 32 IU/L 20 16 -    Lab Results  Component Value Date   WBC 7.8 04/27/2019   HGB 15.0 04/27/2019   HCT 44.6 04/27/2019   MCV 85 04/27/2019   PLT 308 04/27/2019   NEUTROABS 4.8 04/27/2019    ASSESSMENT & PLAN:  Carcinoma  of upper-outer quadrant of left breast in female, estrogen receptor positive ([place]) 10/11/2017:Left lumpectomy: IDC grade 2, 2.1 cm, margins Negative 0.1 cm to the anterior margin, UDH, 0/3 lymph nodes negative, ER 95%, PR 80%, HER-2 negative, Ki-67 10%, T2N0 stage IA Oncotype DX recurrence score 23: Risk of distant recurrence at 9 years with hormone therapy alone 9% no chemo benefit Adjuvant radiation therapy 11/19/2019to01/03/2018  Treatment plan: Adjuvant antiestrogen therapy with anastrozole 1 mg daily started1/20/2020switched to letrozole 05/18/2019 Severe fluid retention for which she takes Lasix twice a day. In spite of increasing Lasix to 40 mg twice a day, she has not lost much of the fluid. We will switch her from Lasix to Bumex.    Breast cancer surveillance: 1.Breast MRI 04/25/2019:No evidence of malignancy. 2.Breast exam 04/26/2019:Benign 3.Mammogramwill need to be scheduled in September 2021.  We discussed about intermittent fasting as a way to lose weight. Return to clinic in 1 months with labs and follow-up    No orders of the defined types were placed in this encounter.  The patient has a good understanding of the overall plan. she agrees with it. she will call with any problems that may develop before the next visit here.  Total time spent: 30 mins including face to face time and time spent for planning, charting and coordination of care  Nicholas Lose, MD 08/14/2019  I, Cloyde Reams Dorshimer, am acting as scribe for Dr. Nicholas Lose.  I have reviewed the above documentation for accuracy and completeness, and I agree with the above.

## 2019-08-14 ENCOUNTER — Other Ambulatory Visit: Payer: Self-pay

## 2019-08-14 ENCOUNTER — Inpatient Hospital Stay: Payer: 59

## 2019-08-14 ENCOUNTER — Inpatient Hospital Stay: Payer: 59 | Attending: Hematology and Oncology | Admitting: Hematology and Oncology

## 2019-08-14 ENCOUNTER — Other Ambulatory Visit: Payer: Self-pay | Admitting: Hematology and Oncology

## 2019-08-14 ENCOUNTER — Other Ambulatory Visit: Payer: Self-pay | Admitting: Surgery

## 2019-08-14 DIAGNOSIS — C50412 Malignant neoplasm of upper-outer quadrant of left female breast: Secondary | ICD-10-CM

## 2019-08-14 DIAGNOSIS — Z923 Personal history of irradiation: Secondary | ICD-10-CM | POA: Insufficient documentation

## 2019-08-14 DIAGNOSIS — R609 Edema, unspecified: Secondary | ICD-10-CM | POA: Insufficient documentation

## 2019-08-14 DIAGNOSIS — Z170 Estrogen receptor positive status [ER+]: Secondary | ICD-10-CM | POA: Diagnosis not present

## 2019-08-14 DIAGNOSIS — Z79811 Long term (current) use of aromatase inhibitors: Secondary | ICD-10-CM | POA: Insufficient documentation

## 2019-08-14 DIAGNOSIS — Z853 Personal history of malignant neoplasm of breast: Secondary | ICD-10-CM

## 2019-08-14 LAB — MAGNESIUM: Magnesium: 2.1 mg/dL (ref 1.7–2.4)

## 2019-08-14 LAB — CMP (CANCER CENTER ONLY)
ALT: 14 U/L (ref 0–44)
AST: 13 U/L — ABNORMAL LOW (ref 15–41)
Albumin: 3.7 g/dL (ref 3.5–5.0)
Alkaline Phosphatase: 84 U/L (ref 38–126)
Anion gap: 10 (ref 5–15)
BUN: 13 mg/dL (ref 8–23)
CO2: 23 mmol/L (ref 22–32)
Calcium: 9.6 mg/dL (ref 8.9–10.3)
Chloride: 106 mmol/L (ref 98–111)
Creatinine: 0.78 mg/dL (ref 0.44–1.00)
GFR, Est AFR Am: >60 mL/min (ref >60)
GFR, Estimated: >60 mL/min (ref >60)
Glucose, Bld: 135 mg/dL — ABNORMAL HIGH (ref 70–99)
Potassium: 4.5 mmol/L (ref 3.5–5.1)
Sodium: 139 mmol/L (ref 135–145)
Total Bilirubin: 0.5 mg/dL (ref 0.3–1.2)
Total Protein: 7.2 g/dL (ref 6.5–8.1)

## 2019-08-14 MED ORDER — BUMETANIDE 2 MG PO TABS: 2.0000 mg | ORAL_TABLET | Freq: Every day | ORAL | 1 refills | Status: DC

## 2019-08-14 NOTE — Assessment & Plan Note (Signed)
10/11/2017:Left lumpectomy: IDC grade 2, 2.1 cm, margins Negative 0.1 cm to the anterior margin, UDH, 0/3 lymph nodes negative, ER 95%, PR 80%, HER-2 negative, Ki-67 10%, T2N0 stage IA Oncotype DX recurrence score 23: Risk of distant recurrence at 9 years with hormone therapy alone 9% no chemo benefit Adjuvant radiation therapy 11/19/2019to01/03/2018  Treatment plan: Adjuvant antiestrogen therapy with anastrozole 1 mg daily started1/20/2020switched to letrozole 05/18/2019 Severe fluid retention for which she takes Lasix twice a day. In spite of this she is not losing weight and continues to struggle with the fluid retention. Therefore we increase Lasix to 40 mg daily with potassium supplementation.  Lab review on the increased dosage of Lasix:  Breast cancer surveillance: 1.Breast MRI 04/25/2019:No evidence of malignancy. 2.Breast exam 04/26/2019:Benign 3.Mammogramwill need to be scheduled in September 2021.  Return to clinic in 6 months with labs and follow-up

## 2019-09-11 NOTE — Progress Notes (Signed)
Patient Care Team: Chevis Pretty, FNP as PCP - General (Family Medicine) [name], MD as Consulting Physician (General Surgery) Nicholas Lose, MD as Consulting Physician (Hematology and Oncology) Gery Pray, MD as Consulting Physician (Radiation Oncology)  DIAGNOSIS:    ICD-10-CM   1. Carcinoma of upper-outer quadrant of left breast in female, estrogen receptor positive ([place])  C50.412    Z17.0     SUMMARY OF ONCOLOGIC HISTORY: Oncology History  Carcinoma of upper-outer quadrant of left breast in female, estrogen receptor positive ([place])  10/09/2017 Initial Diagnosis   Carcinoma of upper-outer quadrant of left breast in female, estrogen receptor positive ([place])   10/20/2017 Surgery   Left lumpectomy: IDC grade 2, 2.1 cm, margins Negative 0.1 cm to the anterior margin, UDH, 0/3 lymph nodes negative, ER 95%, PR 80%, HER-2 negative, Ki-67 10%, T2N0 stage IA   10/25/2017 Cancer Staging   Staging form: Breast, AJCC 8th Edition - Pathologic: Stage IA (pT2, pN0(sn), cM0, G2, ER+, PR+, HER2-) - Signed by Nicholas Lose, MD on 10/25/2017   11/02/2017 Oncotype testing   Oncotype DX recurrence score 23, distant recurrence at 9 years 9%, no chemo benefit   11/29/2017 - 01/13/2018 Radiation Therapy   Adjuvant radiation therapy   01/2018 -  Anti-estrogen oral therapy   Anastrozole daily, switched to letrozole 05/18/19     CHIEF COMPLIANT: Follow-upof left breast cancer onletrozole  INTERVAL HISTORY: Alexis [name] is a 64 y.o. with above-mentioned history of left breast cancer treated with lumpectomy,radiation, and who is currently on antiestrogen therapy withletrozole. She presentsto the clinictodayforfollow-up.  She reports to me that the severe swelling in the fingers has improved remarkably.  She is also lost a couple of pounds.  She is happy about that.  She unfortunately has to go to the bathroom a lot.  She has not noticed any increased muscle cramps.  She is  currently taking 2 mg of Bumex once a day.  ALLERGIES:  is allergic to canagliflozin, empagliflozin, metformin hcl, and parlodel [bromocriptine mesylate].  MEDICATIONS:  Current Outpatient Medications  Medication Sig Dispense Refill  . ACCU-CHEK FASTCLIX LANCETS MISC USE 3 TIMES A DAY AS DIRECTED  3  . ACCU-CHEK GUIDE test strip USE 3 TIMES A DAY AS DIRECTED  3  . aspirin 81 MG tablet Take 81 mg by mouth daily.     . bumetanide (BUMEX) 2 MG tablet Take 1 tablet (2 mg total) by mouth daily. 60 tablet 1  . cetirizine (ZYRTEC) 10 MG tablet Take 10 mg by mouth daily.    . Cholecalciferol (VITAMIN D PO) Take by mouth.    . Insulin Regular Human (HUMULIN R U-500 KWIKPEN [place]) Inject into the skin. Takes 100 am, 70 lunch, 100 dinner    . letrozole (FEMARA) 2.5 MG tablet Take 1 tablet (2.5 mg total) by mouth daily. 90 tablet 3  . potassium chloride SA (KLOR-CON) 20 MEQ tablet Take 1 tablet (20 mEq total) by mouth 2 (two) times daily. 60 tablet 1  . rosuvastatin (CRESTOR) 5 MG tablet     . UNIFINE PENTIPS 31G X 5 MM MISC USE TWICE A DAY AS DIRECTED  2   No current facility-administered medications for this visit.    PHYSICAL EXAMINATION: ECOG PERFORMANCE STATUS: 1 - Symptomatic but completely ambulatory  Vitals:   09/12/19 0836  BP: (!) 135/54  Pulse: 62  Resp: 17  Temp: 98 F (36.7 C)  SpO2: 95%   Filed Weights   09/12/19 0836  Weight: 221  lb 6.4 oz (100.4 kg)    LABORATORY DATA:  I have reviewed the data as listed CMP Latest Ref Rng & Units 08/14/2019 04/27/2019 10/20/2018  Glucose 70 - 99 mg/dL 135(H) 130(H) 136(H)  BUN 8 - 23 mg/dL $Remove'13 14 13  'radmkWc$ Creatinine 0.44 - 1.00 mg/dL 0.78 0.73 0.69  Sodium 135 - 145 mmol/L 139 139 141  Potassium 3.5 - 5.1 mmol/L 4.5 4.4 4.1  Chloride 98 - 111 mmol/L 106 103 103  CO2 22 - 32 mmol/L $RemoveB'23 23 27  'luioKssi$ Calcium 8.9 - 10.3 mg/dL 9.6 9.4 9.1  Total Protein 6.5 - 8.1 g/dL 7.2 6.9 6.5  Total Bilirubin 0.3 - 1.2 mg/dL 0.5 0.4 0.3  Alkaline Phos 38 - 126 U/L  84 98 89  AST 15 - 41 U/L 13(L) 16 15  ALT 0 - 44 U/L $Remo'14 20 16    'hbXMy$ Lab Results  Component Value Date   WBC 6.3 09/12/2019   HGB 12.8 09/12/2019   HCT 40.1 09/12/2019   MCV 83.9 09/12/2019   PLT 193 09/12/2019   NEUTROABS 4.0 09/12/2019    ASSESSMENT & PLAN:  Carcinoma of upper-outer quadrant of left breast in female, estrogen receptor positive ([place]) 10/11/2017:Left lumpectomy: IDC grade 2, 2.1 cm, margins Negative 0.1 cm to the anterior margin, UDH, 0/3 lymph nodes negative, ER 95%, PR 80%, HER-2 negative, Ki-67 10%, T2N0 stage IA Oncotype DX recurrence score 23: Risk of distant recurrence at 9 years with hormone therapy alone 9% no chemo benefit Adjuvant radiation therapy 11/19/2019to01/03/2018  Treatment plan: Adjuvant antiestrogen therapy with anastrozole 1 mg daily started1/20/2020switched to letrozole 05/18/2019 Severe fluid retention currently on Bumex 2 mg once a day. She will continue Bumex for the next 2 to 3 months and then slowly taper it off and discontinue.  Breast cancer surveillance: 1.Breast MRI 04/25/2019:No evidence of malignancy. 2.Breast exam 04/26/2019:Benign 3.Mammogramwill need to be scheduled in September 2021.  We discussed about intermittent fasting as a way to lose weight. Return to clinic in 1 months with labs and follow-up    No orders of the defined types were placed in this encounter.  The patient has a good understanding of the overall plan. she agrees with it. she will call with any problems that may develop before the next visit here.  Total time spent: 20 mins including face to face time and time spent for planning, charting and coordination of care  Nicholas Lose, MD 09/12/2019  I, Cloyde Reams Dorshimer, am acting as scribe for Dr. Nicholas Lose.  I have reviewed the above documentation for accuracy and completeness, and I agree with the above.

## 2019-09-12 ENCOUNTER — Other Ambulatory Visit: Payer: Self-pay

## 2019-09-12 ENCOUNTER — Telehealth: Payer: Self-pay | Admitting: Hematology and Oncology

## 2019-09-12 ENCOUNTER — Inpatient Hospital Stay: Payer: 59

## 2019-09-12 ENCOUNTER — Inpatient Hospital Stay: Payer: 59 | Attending: Hematology and Oncology | Admitting: Hematology and Oncology

## 2019-09-12 DIAGNOSIS — C50412 Malignant neoplasm of upper-outer quadrant of left female breast: Secondary | ICD-10-CM | POA: Diagnosis not present

## 2019-09-12 DIAGNOSIS — M7989 Other specified soft tissue disorders: Secondary | ICD-10-CM | POA: Insufficient documentation

## 2019-09-12 DIAGNOSIS — Z79899 Other long term (current) drug therapy: Secondary | ICD-10-CM | POA: Diagnosis not present

## 2019-09-12 DIAGNOSIS — Z170 Estrogen receptor positive status [ER+]: Secondary | ICD-10-CM | POA: Insufficient documentation

## 2019-09-12 DIAGNOSIS — R609 Edema, unspecified: Secondary | ICD-10-CM | POA: Diagnosis not present

## 2019-09-12 LAB — CMP (CANCER CENTER ONLY)
ALT: 12 U/L (ref 0–44)
AST: 10 U/L — ABNORMAL LOW (ref 15–41)
Albumin: 3.8 g/dL (ref 3.5–5.0)
Alkaline Phosphatase: 90 U/L (ref 38–126)
Anion gap: 12 (ref 5–15)
BUN: 15 mg/dL (ref 8–23)
CO2: 26 mmol/L (ref 22–32)
Calcium: 10.2 mg/dL (ref 8.9–10.3)
Chloride: 101 mmol/L (ref 98–111)
Creatinine: 0.87 mg/dL (ref 0.44–1.00)
GFR, Est AFR Am: >60 mL/min (ref >60)
GFR, Estimated: >60 mL/min (ref >60)
Glucose, Bld: 190 mg/dL — ABNORMAL HIGH (ref 70–99)
Potassium: 4.2 mmol/L (ref 3.5–5.1)
Sodium: 139 mmol/L (ref 135–145)
Total Bilirubin: 0.4 mg/dL (ref 0.3–1.2)
Total Protein: 7.2 g/dL (ref 6.5–8.1)

## 2019-09-12 LAB — CBC WITH DIFFERENTIAL (CANCER CENTER ONLY)
Abs Immature Granulocytes: 0.02 10*3/uL (ref 0.00–0.07)
Basophils Absolute: 0 10*3/uL (ref 0.0–0.1)
Basophils Relative: 0 %
Eosinophils Absolute: 0.1 10*3/uL (ref 0.0–0.5)
Eosinophils Relative: 2 %
HCT: 40.1 % (ref 36.0–46.0)
Hemoglobin: 12.8 g/dL (ref 12.0–15.0)
Immature Granulocytes: 0 %
Lymphocytes Relative: 26 %
Lymphs Abs: 1.6 10*3/uL (ref 0.7–4.0)
MCH: 26.8 pg (ref 26.0–34.0)
MCHC: 31.9 g/dL (ref 30.0–36.0)
MCV: 83.9 fL (ref 80.0–100.0)
Monocytes Absolute: 0.4 10*3/uL (ref 0.1–1.0)
Monocytes Relative: 7 %
Neutro Abs: 4 10*3/uL (ref 1.7–7.7)
Neutrophils Relative %: 65 %
Platelet Count: 193 10*3/uL (ref 150–400)
RBC: 4.78 MIL/uL (ref 3.87–5.11)
RDW: 14.7 % (ref 11.5–15.5)
WBC Count: 6.3 10*3/uL (ref 4.0–10.5)
nRBC: 0 % (ref 0.0–0.2)

## 2019-09-12 LAB — MAGNESIUM: Magnesium: 1.9 mg/dL (ref 1.7–2.4)

## 2019-09-12 NOTE — Telephone Encounter (Signed)
Scheduled appts per 9/1 los. Gave pt a print out of AVS.  

## 2019-09-12 NOTE — Assessment & Plan Note (Signed)
10/11/2017:Left lumpectomy: IDC grade 2, 2.1 cm, margins Negative 0.1 cm to the anterior margin, UDH, 0/3 lymph nodes negative, ER 95%, PR 80%, HER-2 negative, Ki-67 10%, T2N0 stage IA Oncotype DX recurrence score 23: Risk of distant recurrence at 9 years with hormone therapy alone 9% no chemo benefit Adjuvant radiation therapy 11/19/2019to01/03/2018  Treatment plan: Adjuvant antiestrogen therapy with anastrozole 1 mg daily started1/20/2020switched to letrozole 05/18/2019 Severe fluid retention for which she takes Lasix twice a day. In spite of increasing Lasix to 40 mg twice a day, she has not lost much of the fluid. We will switch her from Lasix to Bumex.    Breast cancer surveillance: 1.Breast MRI 04/25/2019:No evidence of malignancy. 2.Breast exam 04/26/2019:Benign 3.Mammogramwill need to be scheduled in September 2021.  We discussed about intermittent fasting as a way to lose weight. Return to clinic in 1 months with labs and follow-up

## 2019-09-21 ENCOUNTER — Other Ambulatory Visit (HOSPITAL_COMMUNITY): Payer: Self-pay | Admitting: Internal Medicine

## 2019-09-21 DIAGNOSIS — R946 Abnormal results of thyroid function studies: Secondary | ICD-10-CM | POA: Diagnosis not present

## 2019-09-21 DIAGNOSIS — Z794 Long term (current) use of insulin: Secondary | ICD-10-CM | POA: Diagnosis not present

## 2019-09-21 DIAGNOSIS — E1165 Type 2 diabetes mellitus with hyperglycemia: Secondary | ICD-10-CM | POA: Diagnosis not present

## 2019-09-21 DIAGNOSIS — Z8639 Personal history of other endocrine, nutritional and metabolic disease: Secondary | ICD-10-CM | POA: Diagnosis not present

## 2019-09-21 DIAGNOSIS — E785 Hyperlipidemia, unspecified: Secondary | ICD-10-CM | POA: Diagnosis not present

## 2019-09-24 ENCOUNTER — Other Ambulatory Visit: Payer: Self-pay | Admitting: Internal Medicine

## 2019-09-24 DIAGNOSIS — R946 Abnormal results of thyroid function studies: Secondary | ICD-10-CM

## 2019-09-24 DIAGNOSIS — Z8639 Personal history of other endocrine, nutritional and metabolic disease: Secondary | ICD-10-CM

## 2019-09-26 DIAGNOSIS — S6992XA Unspecified injury of left wrist, hand and finger(s), initial encounter: Secondary | ICD-10-CM | POA: Diagnosis not present

## 2019-09-26 DIAGNOSIS — M7989 Other specified soft tissue disorders: Secondary | ICD-10-CM | POA: Diagnosis not present

## 2019-09-28 ENCOUNTER — Ambulatory Visit (INDEPENDENT_AMBULATORY_CARE_PROVIDER_SITE_OTHER): Payer: 59 | Admitting: Obstetrics and Gynecology

## 2019-09-28 ENCOUNTER — Other Ambulatory Visit: Payer: Self-pay

## 2019-09-28 ENCOUNTER — Encounter: Payer: Self-pay | Admitting: Obstetrics and Gynecology

## 2019-09-28 VITALS — BP 120/78 | Ht 63.0 in | Wt 226.0 lb

## 2019-09-28 DIAGNOSIS — Z13820 Encounter for screening for osteoporosis: Secondary | ICD-10-CM | POA: Diagnosis not present

## 2019-09-28 DIAGNOSIS — Z853 Personal history of malignant neoplasm of breast: Secondary | ICD-10-CM

## 2019-09-28 DIAGNOSIS — Z01419 Encounter for gynecological examination (general) (routine) without abnormal findings: Secondary | ICD-10-CM | POA: Diagnosis not present

## 2019-09-28 DIAGNOSIS — R87620 Atypical squamous cells of undetermined significance on cytologic smear of vagina (ASC-US): Secondary | ICD-10-CM | POA: Diagnosis not present

## 2019-09-28 DIAGNOSIS — N890 Mild vaginal dysplasia: Secondary | ICD-10-CM

## 2019-09-28 NOTE — Addendum Note (Signed)
Addended by: Nelva Nay on: 09/28/2019 03:12 PM   Modules accepted: Orders
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
## 2019-10-01 DIAGNOSIS — M549 Dorsalgia, unspecified: Secondary | ICD-10-CM | POA: Diagnosis not present

## 2019-10-02 ENCOUNTER — Other Ambulatory Visit: Payer: Self-pay | Admitting: Hematology and Oncology

## 2019-10-02 ENCOUNTER — Ambulatory Visit
Admission: RE | Admit: 2019-10-02 | Discharge: 2019-10-02 | Disposition: A | Discharge status: Home / Self Care | Payer: 59 | Source: Ambulatory Visit | Attending: Hematology and Oncology | Admitting: Hematology and Oncology

## 2019-10-02 ENCOUNTER — Other Ambulatory Visit: Payer: Self-pay

## 2019-10-02 DIAGNOSIS — R922 Inconclusive mammogram: Secondary | ICD-10-CM | POA: Diagnosis not present

## 2019-10-02 DIAGNOSIS — Z853 Personal history of malignant neoplasm of breast: Secondary | ICD-10-CM

## 2019-10-02 DIAGNOSIS — N6489 Other specified disorders of breast: Secondary | ICD-10-CM | POA: Diagnosis not present

## 2019-10-03 LAB — PAP IG W/ RFLX HPV ASCU

## 2019-10-03 LAB — HUMAN PAPILLOMAVIRUS, HIGH RISK: HPV DNA High Risk: DETECTED — AB

## 2019-10-05 ENCOUNTER — Encounter: Payer: 59 | Admitting: Obstetrics and Gynecology

## 2019-10-06 DIAGNOSIS — R0781 Pleurodynia: Secondary | ICD-10-CM | POA: Diagnosis not present

## 2019-10-06 DIAGNOSIS — J90 Pleural effusion, not elsewhere classified: Secondary | ICD-10-CM | POA: Diagnosis not present

## 2019-10-06 DIAGNOSIS — M7918 Myalgia, other site: Secondary | ICD-10-CM | POA: Diagnosis not present

## 2019-10-08 ENCOUNTER — Other Ambulatory Visit: Payer: Self-pay

## 2019-10-08 ENCOUNTER — Ambulatory Visit
Admission: RE | Admit: 2019-10-08 | Discharge: 2019-10-08 | Disposition: A | Discharge status: Home / Self Care | Payer: 59 | Source: Ambulatory Visit | Attending: Internal Medicine | Admitting: Internal Medicine

## 2019-10-08 DIAGNOSIS — Z8639 Personal history of other endocrine, nutritional and metabolic disease: Secondary | ICD-10-CM

## 2019-10-08 DIAGNOSIS — R946 Abnormal results of thyroid function studies: Secondary | ICD-10-CM

## 2019-10-08 DIAGNOSIS — E041 Nontoxic single thyroid nodule: Secondary | ICD-10-CM | POA: Diagnosis not present

## 2019-10-12 ENCOUNTER — Telehealth: Payer: Self-pay

## 2019-10-12 NOTE — Telephone Encounter (Signed)
Pt does not want to see MMM because during the last visit MMM did not address any issues other than med refills. Pt was scheduled with MMM because she was [facility] pt. She is willing to see anyone else. She would like to see Christy. Please call back

## 2019-10-12 NOTE — Telephone Encounter (Signed)
Alexis [name], patient would like to switch PCP's to [place].  You have seen her once on 04/27/2019.  Please advise.  Christy, okay with you?

## 2019-10-12 NOTE — Telephone Encounter (Signed)
This is fine with me   

## 2019-10-16 DIAGNOSIS — E042 Nontoxic multinodular goiter: Secondary | ICD-10-CM | POA: Diagnosis not present

## 2019-10-16 DIAGNOSIS — Z23 Encounter for immunization: Secondary | ICD-10-CM | POA: Diagnosis not present

## 2019-10-16 DIAGNOSIS — E119 Type 2 diabetes mellitus without complications: Secondary | ICD-10-CM | POA: Diagnosis not present

## 2019-10-16 DIAGNOSIS — E785 Hyperlipidemia, unspecified: Secondary | ICD-10-CM | POA: Diagnosis not present

## 2019-10-16 DIAGNOSIS — Z794 Long term (current) use of insulin: Secondary | ICD-10-CM | POA: Diagnosis not present

## 2019-10-16 DIAGNOSIS — Z8639 Personal history of other endocrine, nutritional and metabolic disease: Secondary | ICD-10-CM | POA: Diagnosis not present

## 2019-10-17 ENCOUNTER — Ambulatory Visit: Payer: 59 | Admitting: Obstetrics and Gynecology

## 2019-10-17 ENCOUNTER — Other Ambulatory Visit: Payer: Self-pay | Admitting: Internal Medicine

## 2019-10-17 ENCOUNTER — Other Ambulatory Visit (HOSPITAL_COMMUNITY): Payer: Self-pay | Admitting: Internal Medicine

## 2019-10-17 DIAGNOSIS — E042 Nontoxic multinodular goiter: Secondary | ICD-10-CM

## 2019-10-17 DIAGNOSIS — R059 Cough, unspecified: Secondary | ICD-10-CM | POA: Diagnosis not present

## 2019-10-18 DIAGNOSIS — Z9889 Other specified postprocedural states: Secondary | ICD-10-CM | POA: Diagnosis not present

## 2019-10-18 DIAGNOSIS — E041 Nontoxic single thyroid nodule: Secondary | ICD-10-CM | POA: Diagnosis not present

## 2019-10-18 DIAGNOSIS — C50912 Malignant neoplasm of unspecified site of left female breast: Secondary | ICD-10-CM | POA: Diagnosis not present

## 2019-10-25 ENCOUNTER — Other Ambulatory Visit (HOSPITAL_COMMUNITY)
Admission: RE | Admit: 2019-10-25 | Discharge: 2019-10-25 | Disposition: A | Discharge status: Home / Self Care | Payer: 59 | Source: Ambulatory Visit | Attending: Internal Medicine | Admitting: Internal Medicine

## 2019-10-25 ENCOUNTER — Ambulatory Visit
Admission: RE | Admit: 2019-10-25 | Discharge: 2019-10-25 | Disposition: A | Discharge status: Home / Self Care | Payer: 59 | Source: Ambulatory Visit | Attending: Internal Medicine | Admitting: Internal Medicine

## 2019-10-25 DIAGNOSIS — E042 Nontoxic multinodular goiter: Secondary | ICD-10-CM | POA: Diagnosis not present

## 2019-10-25 DIAGNOSIS — R896 Abnormal cytological findings in specimens from other organs, systems and tissues: Secondary | ICD-10-CM | POA: Diagnosis not present

## 2019-10-25 DIAGNOSIS — E041 Nontoxic single thyroid nodule: Secondary | ICD-10-CM | POA: Diagnosis not present

## 2019-10-26 LAB — CYTOLOGY - NON PAP

## 2019-10-31 DIAGNOSIS — E042 Nontoxic multinodular goiter: Secondary | ICD-10-CM | POA: Diagnosis not present

## 2019-11-01 ENCOUNTER — Ambulatory Visit: Payer: 59 | Admitting: Obstetrics and Gynecology

## 2019-11-01 ENCOUNTER — Other Ambulatory Visit: Payer: Self-pay

## 2019-11-01 ENCOUNTER — Encounter: Payer: Self-pay | Admitting: Obstetrics and Gynecology

## 2019-11-01 VITALS — BP 130/82

## 2019-11-01 DIAGNOSIS — R87620 Atypical squamous cells of undetermined significance on cytologic smear of vagina (ASC-US): Secondary | ICD-10-CM | POA: Diagnosis not present

## 2019-11-01 DIAGNOSIS — N898 Other specified noninflammatory disorders of vagina: Secondary | ICD-10-CM

## 2019-11-01 DIAGNOSIS — R87811 Vaginal high risk human papillomavirus (HPV) DNA test positive: Secondary | ICD-10-CM

## 2019-11-01 LAB — WET PREP FOR TRICH, YEAST, CLUE

## 2019-11-01 NOTE — Progress Notes (Signed)
   Alexis [name] [birth year] [id number]  SUBJECTIVE:  64 y.o. [id number] female presents for a vaginal colposcopy.  She has a history of VAIN 1.  Pap smear last year showed LGSIL, negative follow-up colposcopy.  Pap smear this year with ASCUS cytology positive high-risk HPV.  Patient also notes that she has had some clearish vaginal discharge that smells funny.  Please see the previous note for details.  Allergies: Canagliflozin, Empagliflozin, Metformin hcl, and Parlodel [bromocriptine mesylate]  No LMP recorded. Patient has had a hysterectomy.   OBJECTIVE:  BP 130/82   Procedure note Vaginal colposcopy The vaginal cuff and sidewall tissues were visualized with a Graves speculum.  No vaginal discharge is appreciated.  Dilute acetic acid cleanse was performed.  Lugol solution is applied to the vaginal cuff and sidewalls.  There are no acetowhite changes, nor other abnormal findings.  A small Q-tip is used to look into the fold at the dimple of the top of the vaginal cuff and there are no abnormal findings there, either.  Normal colposcopy.  No biopsies were taken.  Patient tolerated procedure well.  Chaperone: [name] present during the examination  Vaginal wet mount prep negative for pathogens   ASSESSMENT:  64 y.o. D5T4707 here for vaginal colposcopy for follow-up of abnormal Pap smear and history of VAIN 1  PLAN:  Negative colposcopy today, no biopsies were taken.  I recommend that she repeat the Pap smear in 1 year.  Negative vaginal wet mount, no finding of discharge on the exam.   Alexis [name] 11/01/19

## 2019-11-02 ENCOUNTER — Ambulatory Visit: Payer: Self-pay | Admitting: Nurse Practitioner

## 2019-11-05 DIAGNOSIS — K5792 Diverticulitis of intestine, part unspecified, without perforation or abscess without bleeding: Secondary | ICD-10-CM | POA: Diagnosis not present

## 2019-11-05 DIAGNOSIS — R110 Nausea: Secondary | ICD-10-CM | POA: Diagnosis not present

## 2019-11-13 ENCOUNTER — Encounter (HOSPITAL_COMMUNITY): Payer: Self-pay

## 2019-12-18 IMAGING — US ULTRASOUND RIGHT BREAST LIMITED
1 series · 9 of 9 positions shown · non-contrast
Comparison: July 10, 2014

CLINICAL DATA: 61-year-old patient referred for annual examination
and evaluation of a palpable mass in the upper outer left breast,
axillary tail of [place], discovered on recent clinical physical
exam.

EXAM:
DIGITAL DIAGNOSTIC BILATERAL MAMMOGRAM WITH CAD AND TOMO
ULTRASOUND BILATERAL BREAST

[Series 1: ultrasound right breast limited · 0.06mm/px · 9 of 9 slices shown]
[im 1/9]
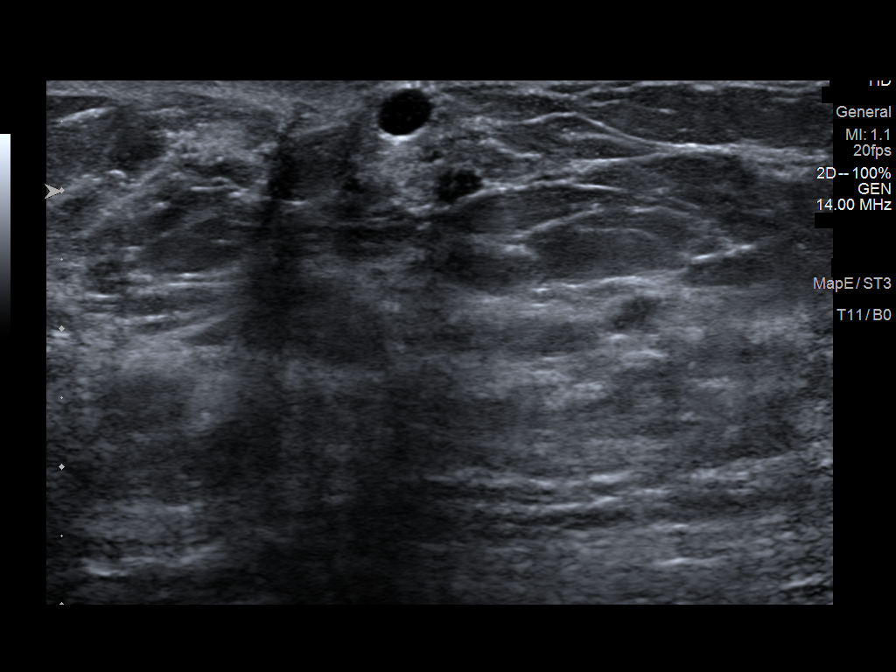
[im 2/9]
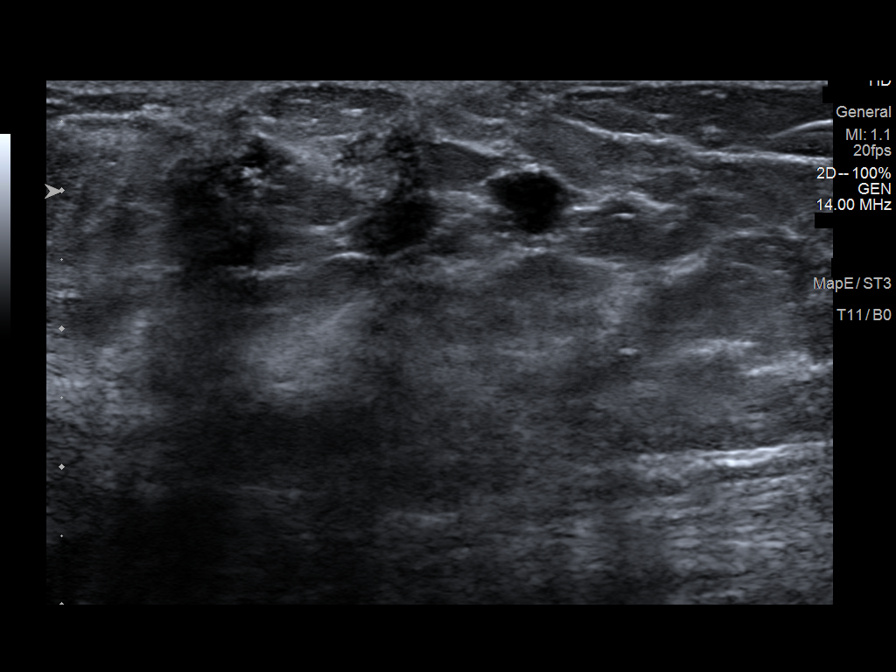
[im 3/9]
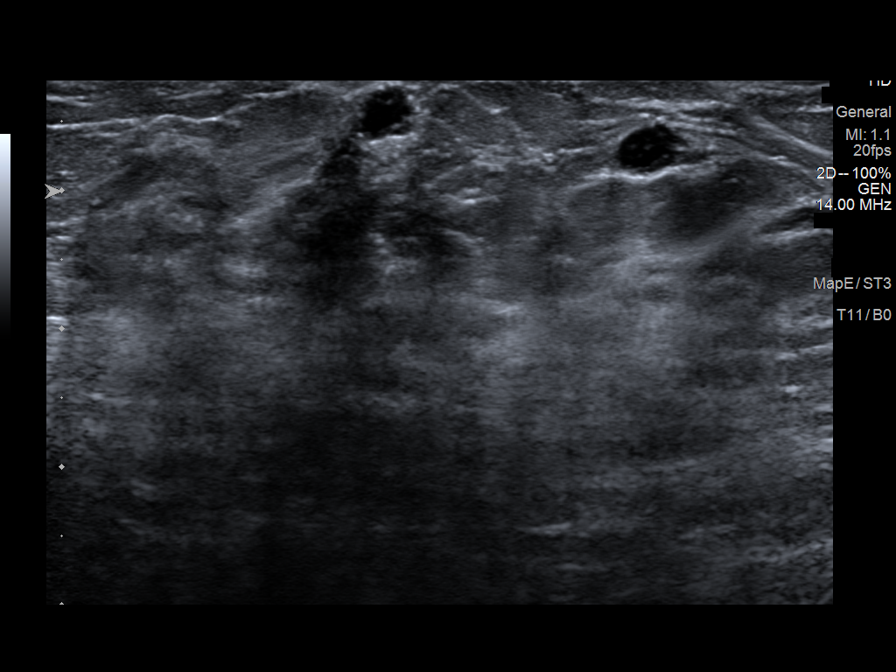
[im 4/9]
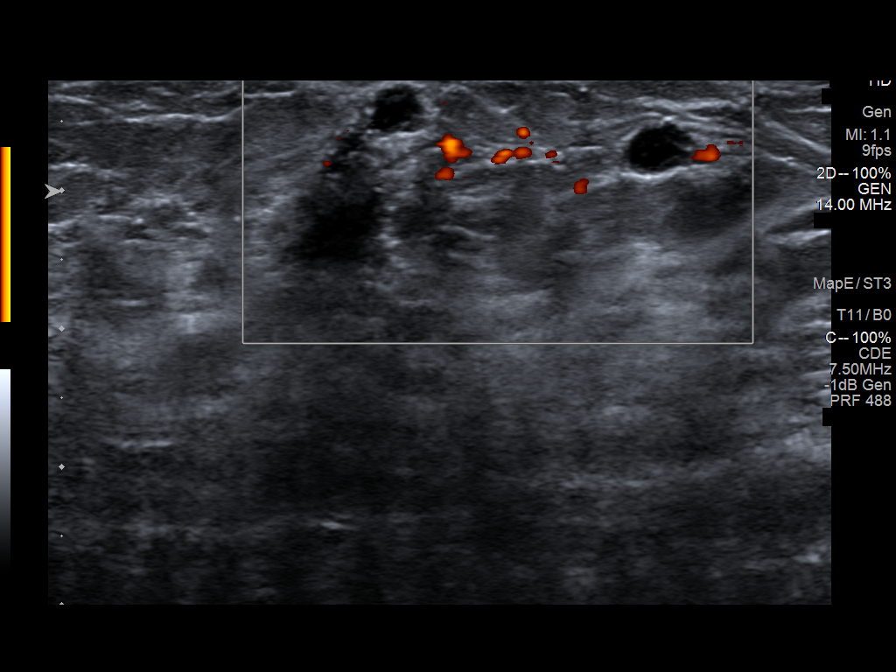
[im 5/9]
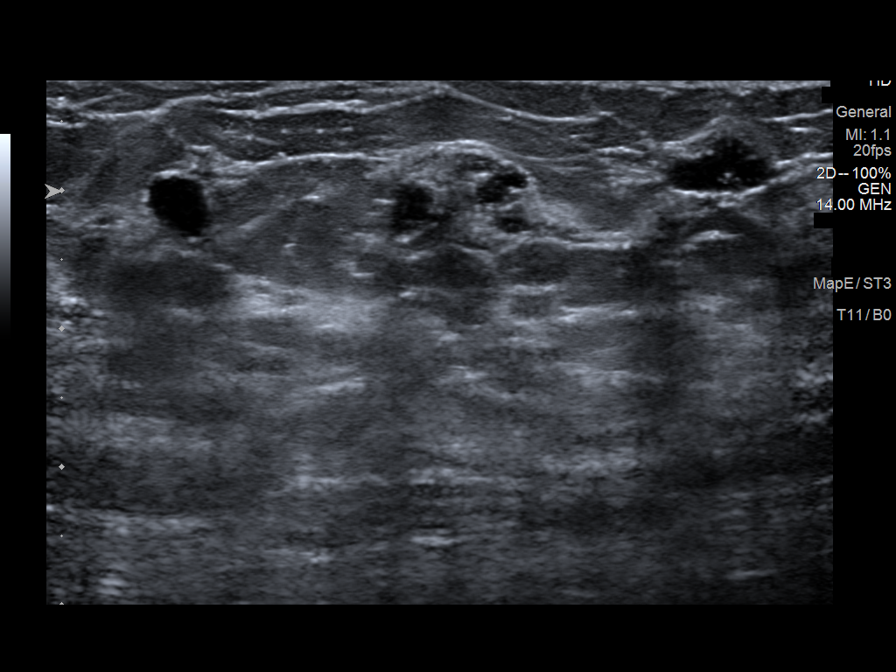
[im 6/9]
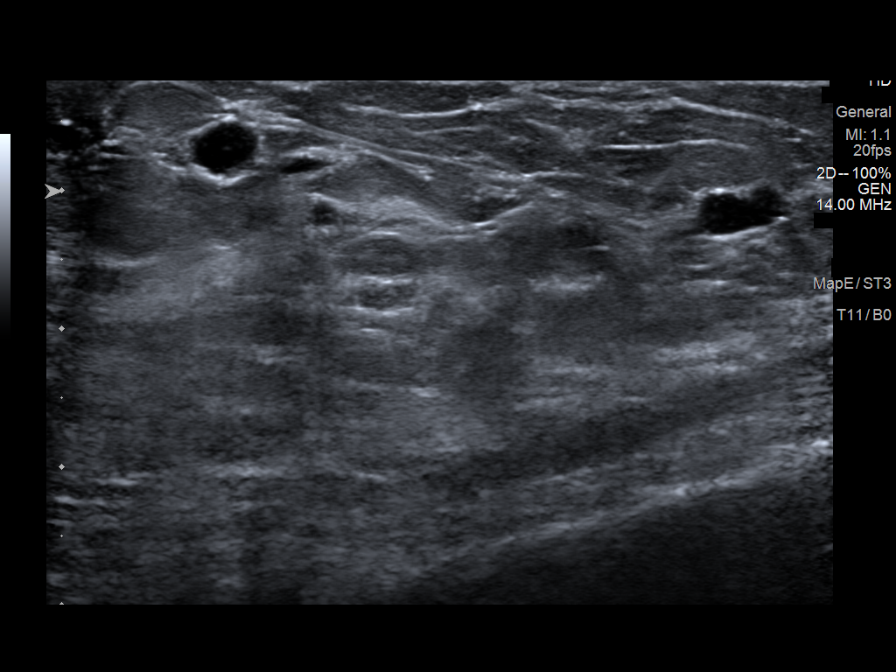
[im 7/9]
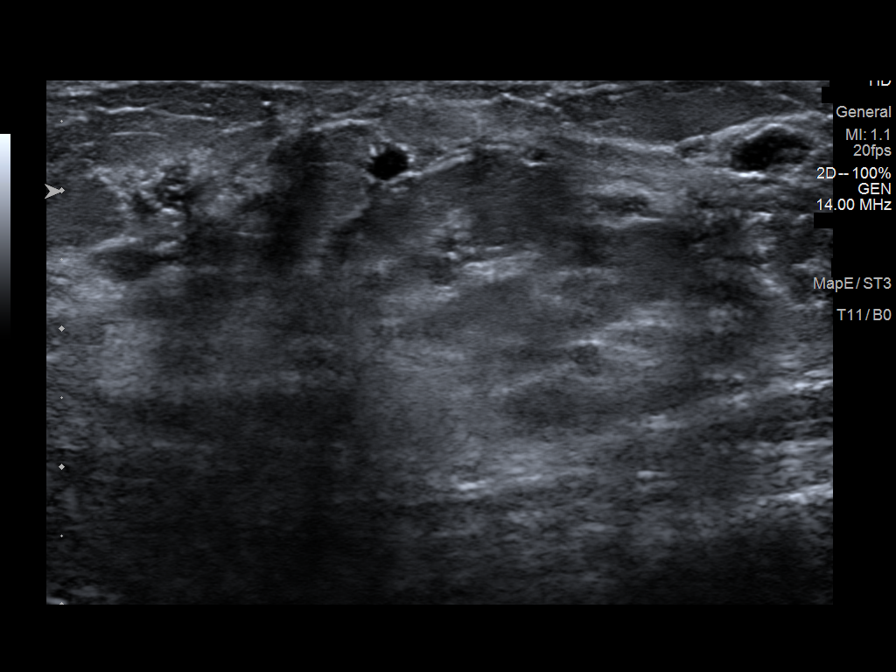
[im 8/9]
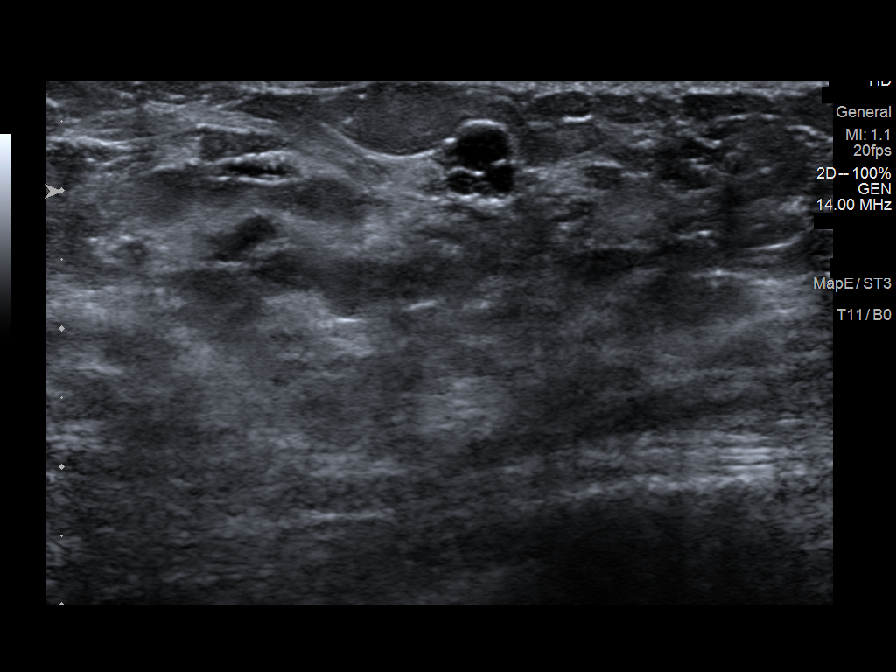
[im 9/9]
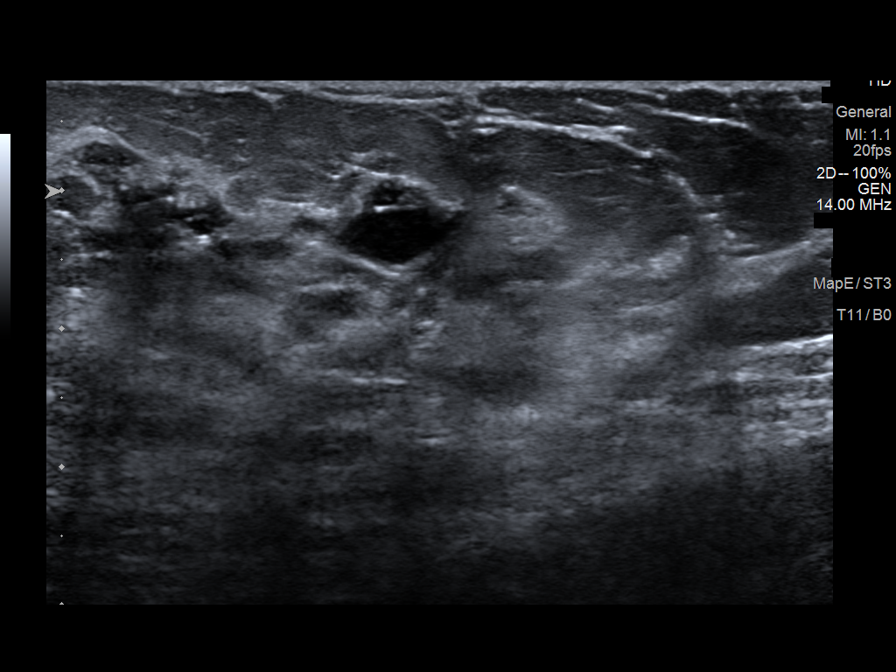

[9 of 9 positions shown; findings below may reference images not displayed]

ACR Breast Density Category c: The breast tissue is heterogeneously
dense, which may obscure small masses.
FINDINGS: Metallic skin marker was placed in the region of palpable concern in
the far upper outer left breast. This region is best visualized in
the MLO projection. Deep to the skin marker is an irregular mass in
the posterior third of the breast parenchyma. No suspicious
microcalcification or additional mass identified is in the left
breast.

In the right breast, there are a few circumscribed subcentimeter
nodules. No irregular mass, distortion, or suspicious
microcalcification.

Mammographic images were processed with CAD.

On physical exam, I do palpate a focal firm mass in the [DATE]
position of the left breast approximately 6-7 cm from the nipple.

Targeted ultrasound is performed, showing a hypoechoic irregular
mass with angulated margins at [DATE] position measuring 2.0 x 1.8 x
1.5 cm. Vascular flow is detected within the mass.

Ultrasound of the left axilla shows normal axillary lymph nodes with
preserved fatty hila and thin cortices. No lymphadenopathy is
detected.

Ultrasound of the retroareolar right breast shows several
subcentimeter simple and mildly complicated cysts, corresponding
with the circumscribed masses seen on mammography. No suspicious
mass in the retroareolar right breast.
IMPRESSION: Suspicious palpable 2.0 cm mass in the [DATE] position of the left
breast near the axillary tail.

No evidence of malignancy in the right breast.

RECOMMENDATION:
Ultrasound-guided biopsy of the left breast is recommended and is
being scheduled for the patient.

I have discussed the findings and recommendations with the patient.
Results were also provided in writing at the conclusion of the
visit. If applicable, a reminder letter will be sent to the patient
regarding the next appointment.

BI-RADS CATEGORY  5: Highly suggestive of malignancy.

## 2019-12-18 IMAGING — US ULTRASOUND LEFT BREAST LIMITED
1 series · 10 of 10 positions shown · non-contrast
Comparison: July 10, 2014

CLINICAL DATA: 61-year-old patient referred for annual examination
and evaluation of a palpable mass in the upper outer left breast,
axillary tail of [place], discovered on recent clinical physical
exam.

EXAM:
DIGITAL DIAGNOSTIC BILATERAL MAMMOGRAM WITH CAD AND TOMO
ULTRASOUND BILATERAL BREAST

[Series 1: ultrasound left breast limited · 0.07mm/px · 10 of 10 slices shown]
[im 1/10]
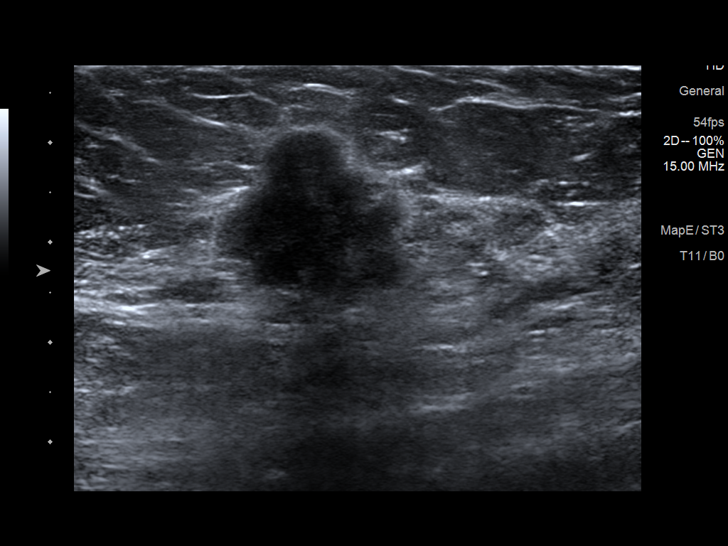
[im 2/10]
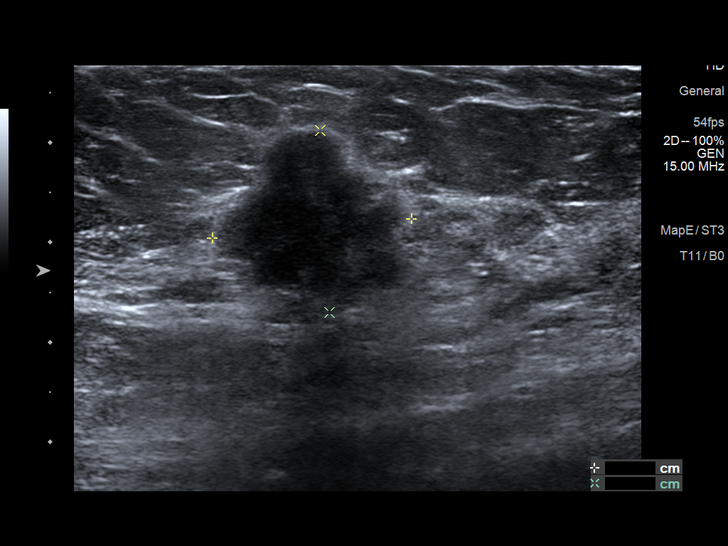
[im 3/10]
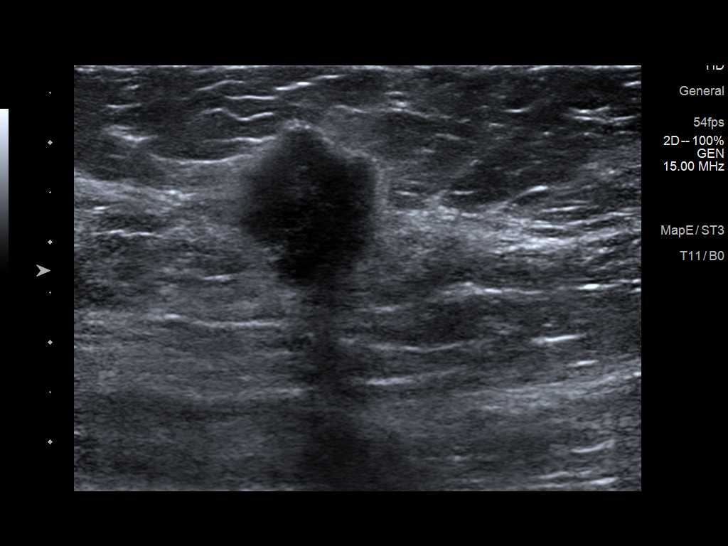
[im 4/10]
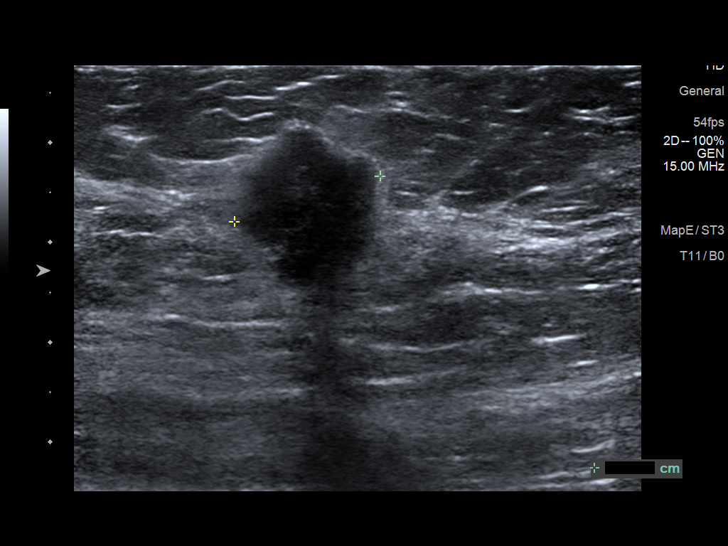
[im 5/10]
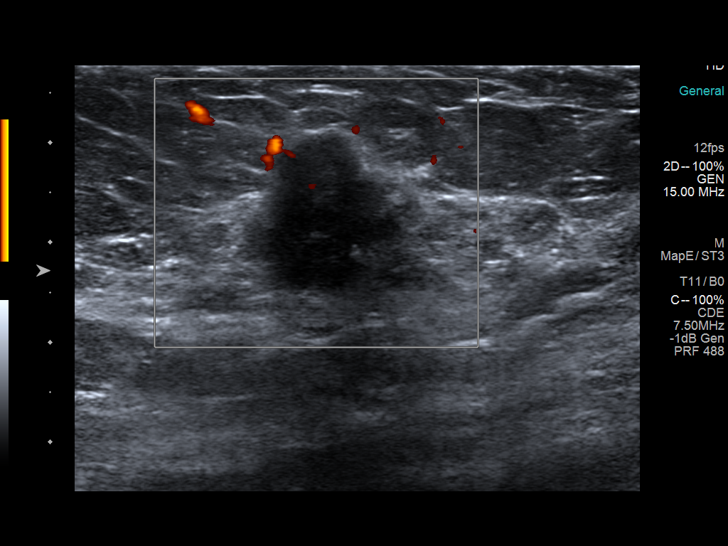
[im 6/10]
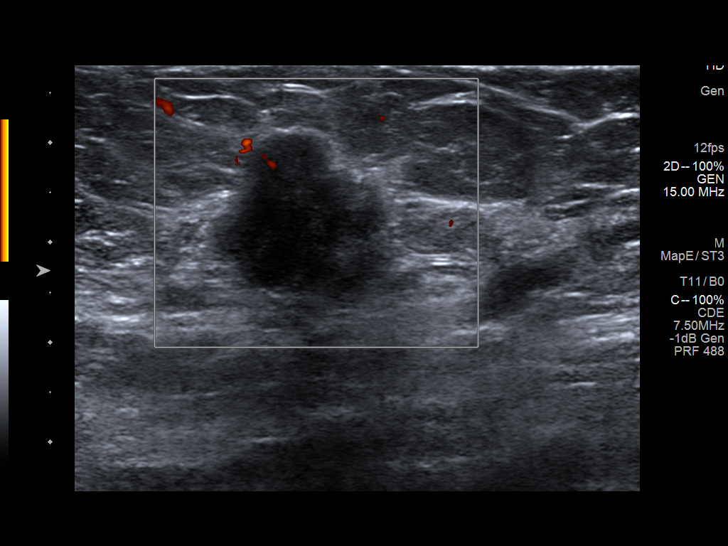
[im 7/10]
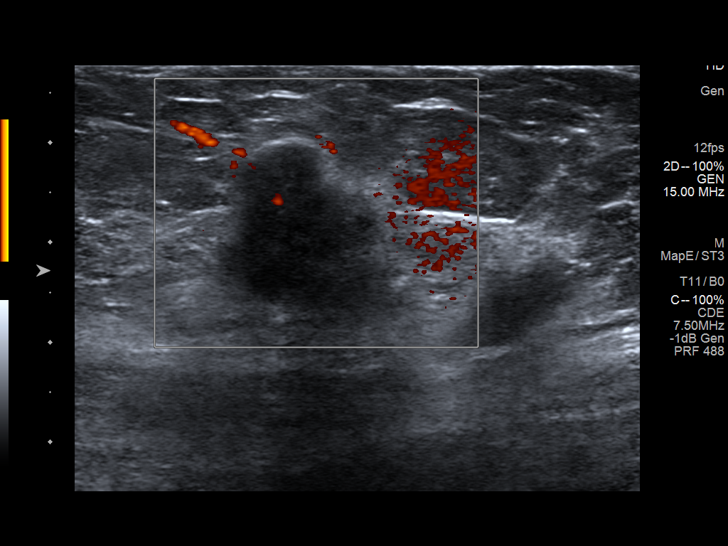
[im 8/10]
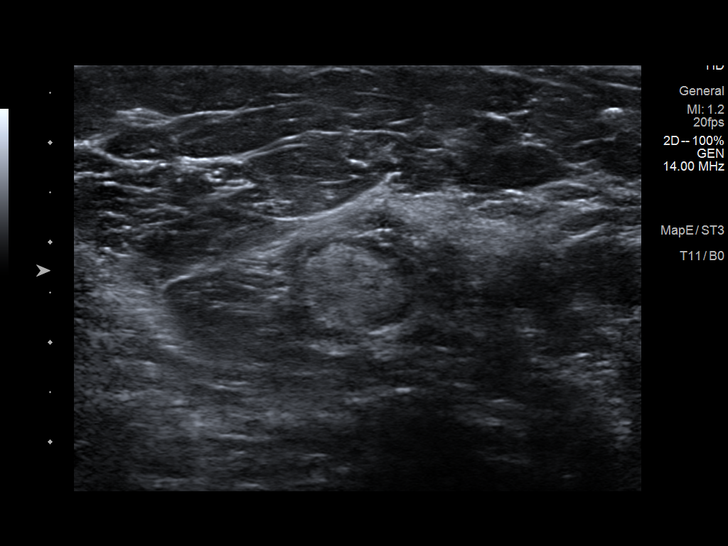
[im 9/10]
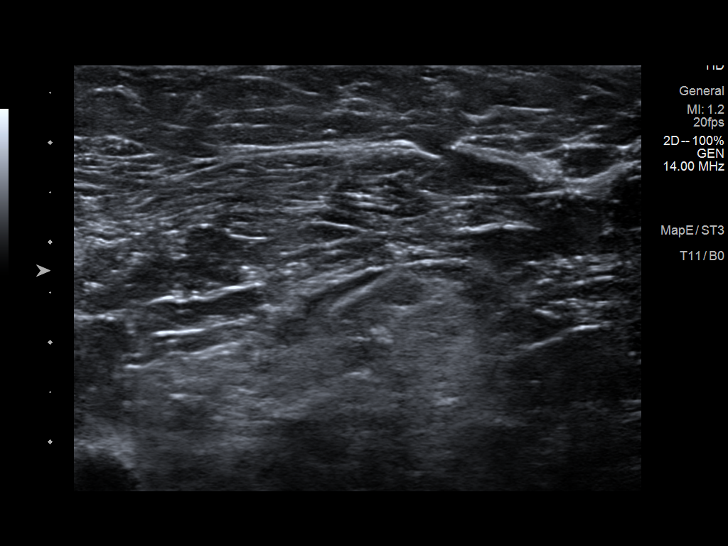
[im 10/10]
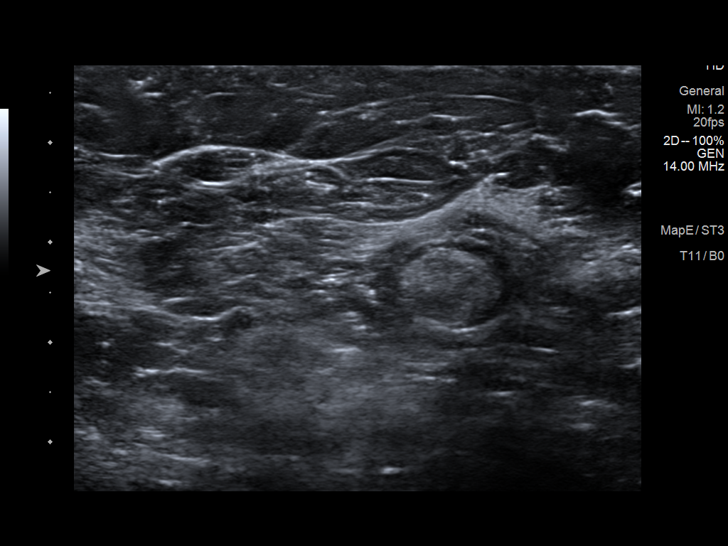

[10 of 10 positions shown; findings below may reference images not displayed]

ACR Breast Density Category c: The breast tissue is heterogeneously
dense, which may obscure small masses.
FINDINGS: Metallic skin marker was placed in the region of palpable concern in
the far upper outer left breast. This region is best visualized in
the MLO projection. Deep to the skin marker is an irregular mass in
the posterior third of the breast parenchyma. No suspicious
microcalcification or additional mass identified is in the left
breast.

In the right breast, there are a few circumscribed subcentimeter
nodules. No irregular mass, distortion, or suspicious
microcalcification.

Mammographic images were processed with CAD.

On physical exam, I do palpate a focal firm mass in the [DATE]
position of the left breast approximately 6-7 cm from the nipple.

Targeted ultrasound is performed, showing a hypoechoic irregular
mass with angulated margins at [DATE] position measuring 2.0 x 1.8 x
1.5 cm. Vascular flow is detected within the mass.

Ultrasound of the left axilla shows normal axillary lymph nodes with
preserved fatty hila and thin cortices. No lymphadenopathy is
detected.

Ultrasound of the retroareolar right breast shows several
subcentimeter simple and mildly complicated cysts, corresponding
with the circumscribed masses seen on mammography. No suspicious
mass in the retroareolar right breast.
IMPRESSION: Suspicious palpable 2.0 cm mass in the [DATE] position of the left
breast near the axillary tail.

No evidence of malignancy in the right breast.

RECOMMENDATION:
Ultrasound-guided biopsy of the left breast is recommended and is
being scheduled for the patient.

I have discussed the findings and recommendations with the patient.
Results were also provided in writing at the conclusion of the
visit. If applicable, a reminder letter will be sent to the patient
regarding the next appointment.

BI-RADS CATEGORY  5: Highly suggestive of malignancy.

## 2019-12-22 IMAGING — US US BREAST BX W LOC DEV 1ST LESION IMG BX SPEC US GUIDE*L*
1 series · 9 of 9 positions shown · non-contrast
Comparison: Previous exam(s).

ADDENDUM:
Pathology revealed GRADE I INVASIVE DUCTAL CARCINOMA of the Left
breast, upper outer quadrant, [DATE] position. This was found to be
concordant by Dr. Ismary Au.

Pathology results were discussed with the patient by telephone. The
patient reported doing well after the biopsy with tenderness at the
site. Post biopsy instructions and care were reviewed and questions
were answered. The patient was encouraged to call The [REDACTED]
Surgical consultation has been arranged with Dr. Pankaz Leion, per
patient request, at [REDACTED] on October 05, 2017.
Dr. Kim was notified of biopsy results via [REDACTED] message on
September 27, 2017.
Pathology results reported by Shalley Jim, RN on 10/03/2017.
CLINICAL DATA: Ultrasound-guided biopsy was recommended of a mass
in the [DATE] position left breast.
EXAM:
ULTRASOUND GUIDED LEFT BREAST CORE NEEDLE BIOPSY

[Series 1: us breast bx w loc dev 1st lesion img bx spec us g · 0.07mm/px · 9 of 9 slices shown]
[im 1/9]
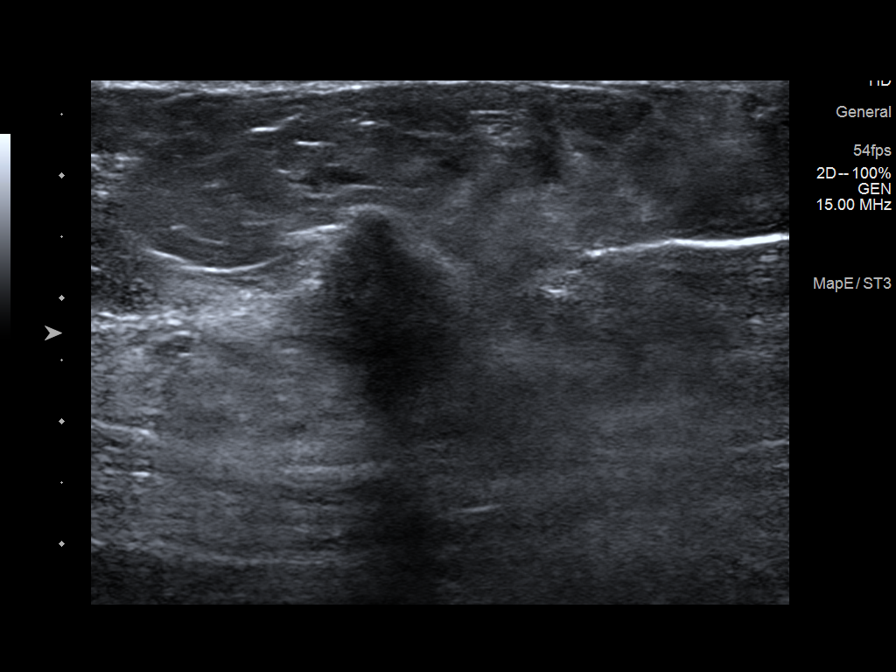
[im 2/9]
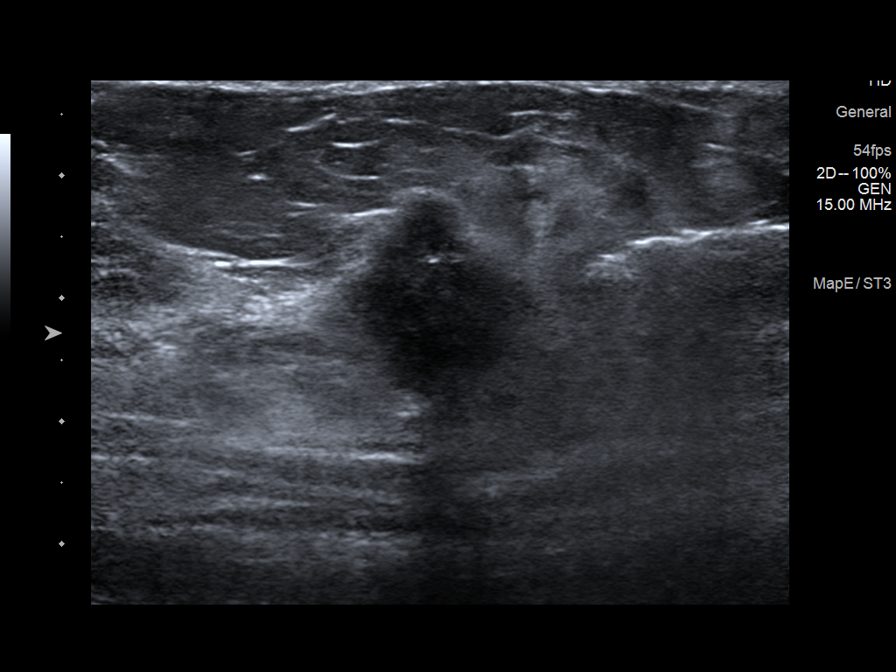
[im 3/9]
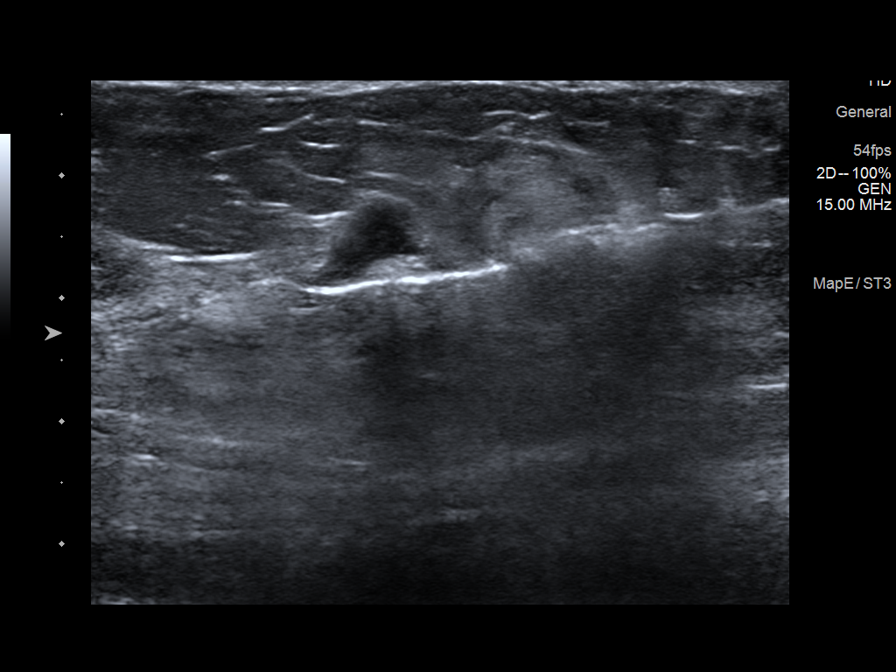
[im 4/9]
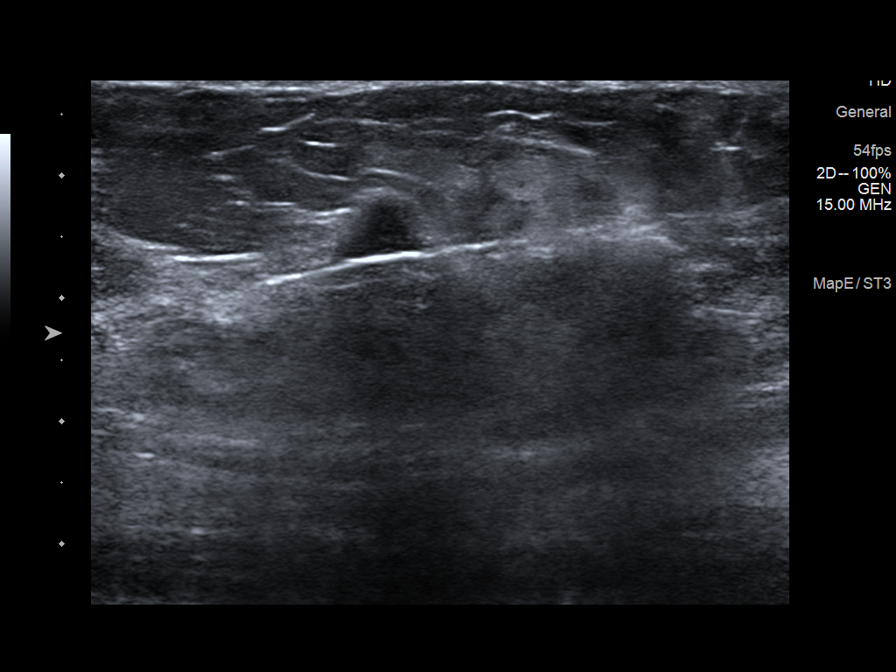
[im 5/9]
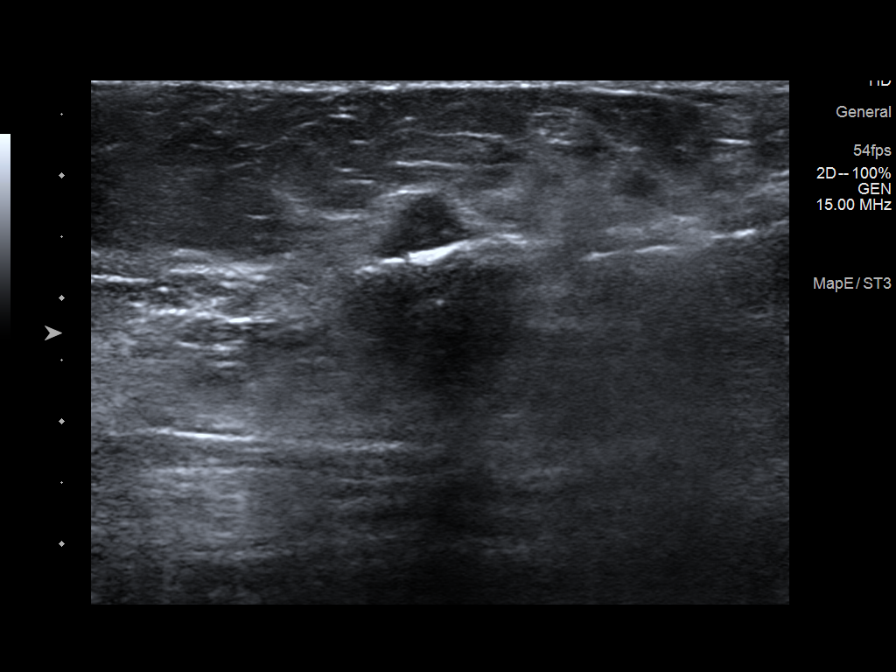
[im 6/9]
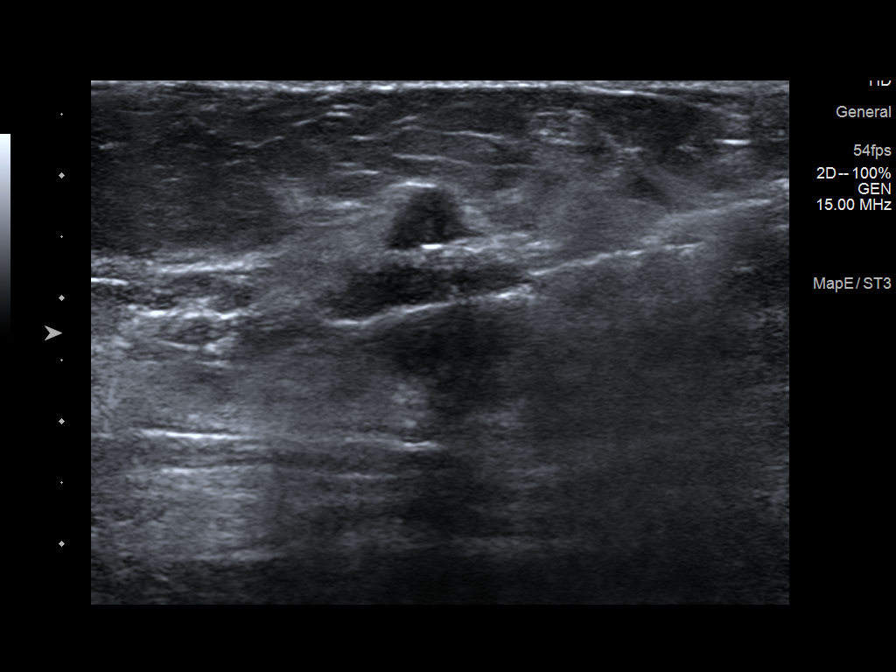
[im 7/9]
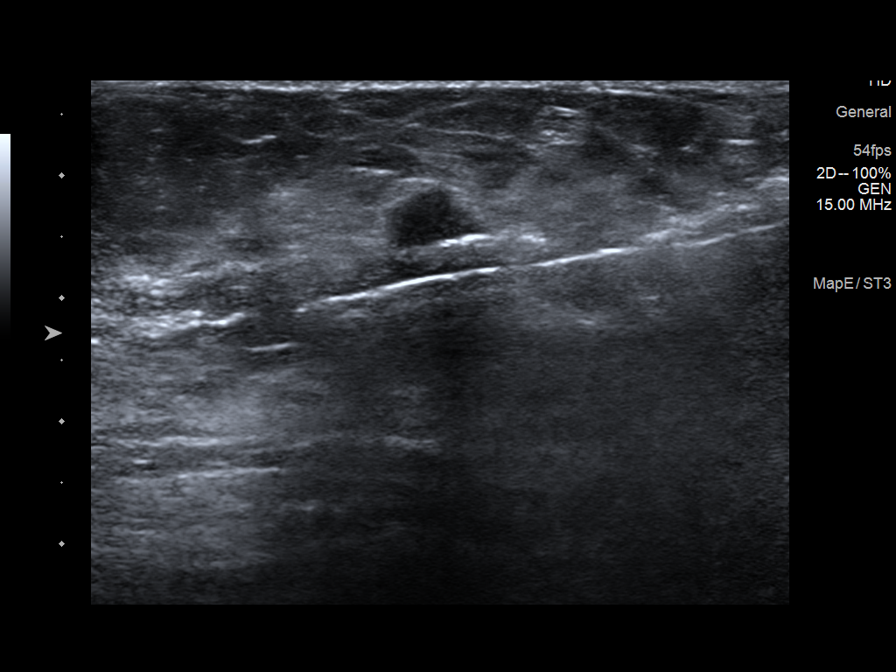
[im 8/9]
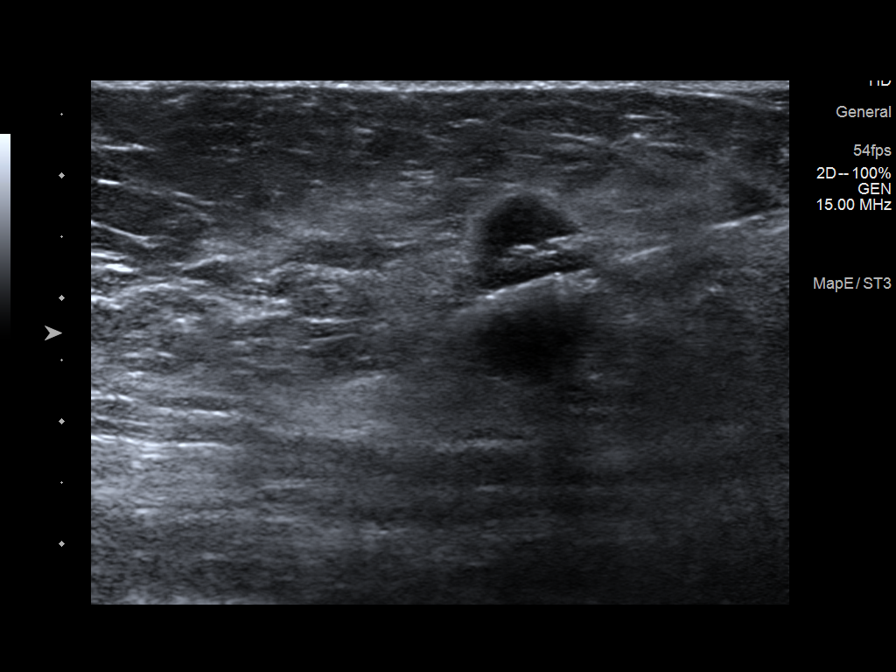
[im 9/9]
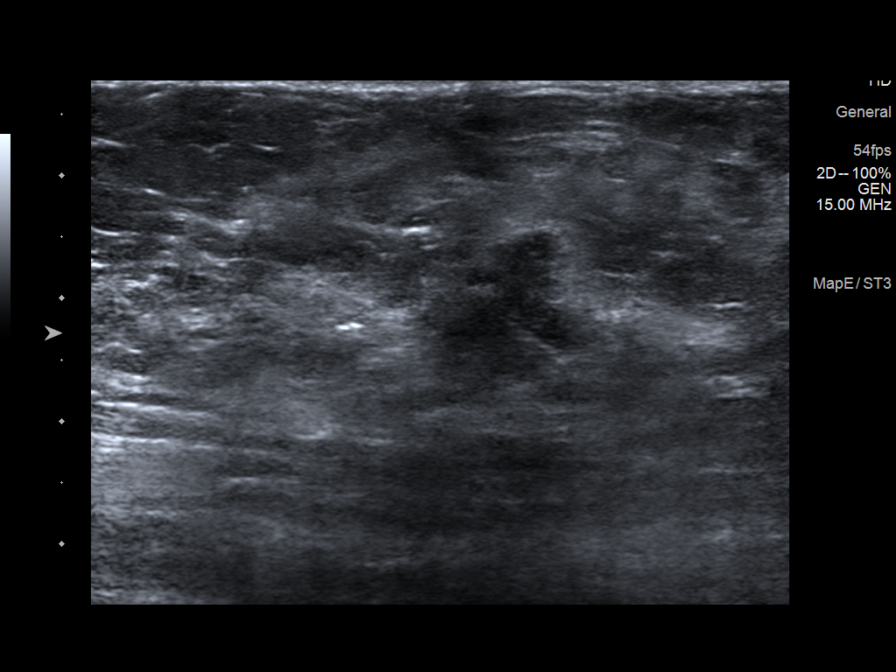

[9 of 9 positions shown; findings below may reference images not displayed]



Lesion quadrant: Upper outer quadrant

Using sterile technique and 1% Lidocaine as local anesthetic, under
direct ultrasound visualization, a 12 gauge Byfors device was
used to perform biopsy of 2 cm mass using a lateral approach. At the
conclusion of the procedure a ribbon tissue marker clip was deployed
into the biopsy cavity. Follow up 2 view mammogram was performed and
dictated separately.
IMPRESSION: Ultrasound guided biopsy of the left breast. No apparent
complications.

## 2019-12-22 IMAGING — MG MM CLIP PLACEMENT
2 series · 2 of 2 positions shown · non-contrast
Comparison: Previous exam(s).

CLINICAL DATA: Ultrasound-guided left breast biopsy was performed
today of a 2.0 cm irregular mass at [DATE] position.

EXAM:
DIAGNOSTIC LEFT MAMMOGRAM POST ULTRASOUND BIOPSY

[L ML]
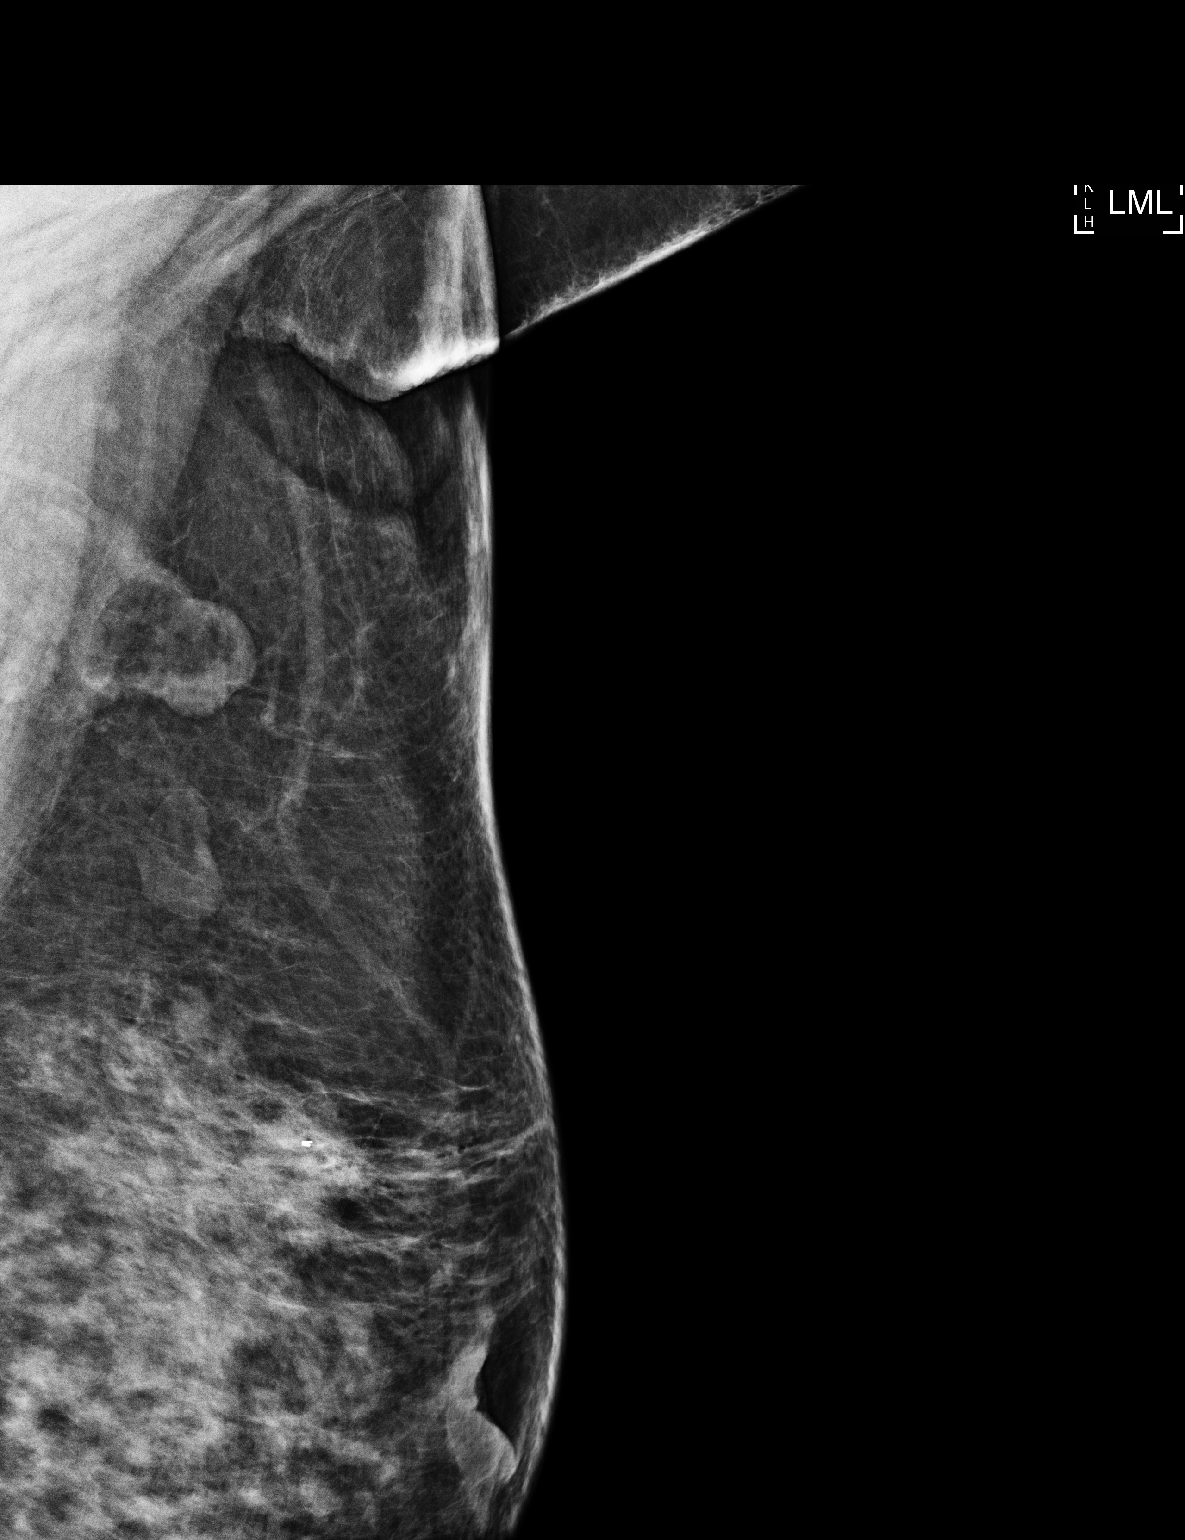

[L CC]
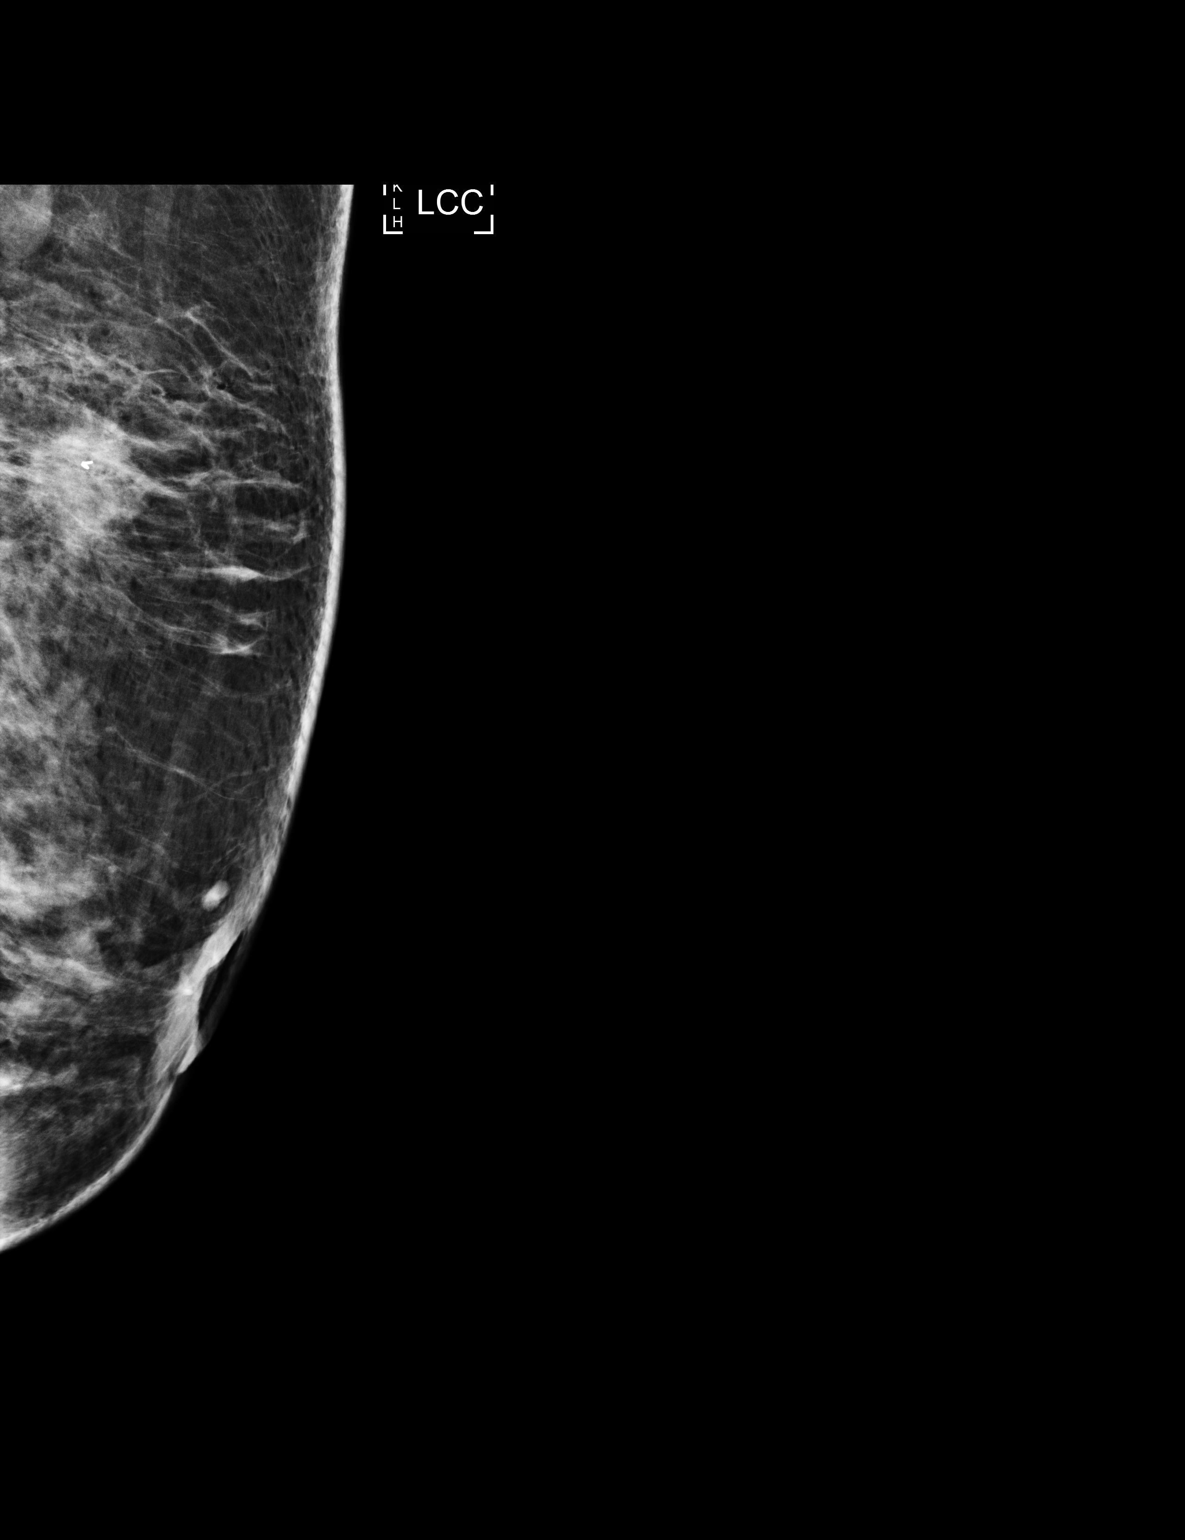

[2 of 2 positions shown; findings below may reference images not displayed]

FINDINGS: Mammographic images were obtained following ultrasound guided biopsy
of a left breast mass. A ribbon shaped biopsy clip is satisfactorily
positioned within the mass.
IMPRESSION: Satisfactory position of ribbon shaped biopsy clip.

Final Assessment: Post Procedure Mammograms for Marker Placement

## 2020-01-21 ENCOUNTER — Other Ambulatory Visit (HOSPITAL_COMMUNITY): Payer: Self-pay | Admitting: Internal Medicine

## 2020-02-06 ENCOUNTER — Emergency Department (HOSPITAL_COMMUNITY)
Admission: EM | Admit: 2020-02-06 | Discharge: 2020-02-07 | Disposition: A | Discharge status: Home / Self Care | Payer: 59 | Attending: Emergency Medicine | Admitting: Emergency Medicine

## 2020-02-06 ENCOUNTER — Other Ambulatory Visit: Payer: Self-pay

## 2020-02-06 ENCOUNTER — Encounter (HOSPITAL_COMMUNITY): Payer: Self-pay | Admitting: Emergency Medicine

## 2020-02-06 DIAGNOSIS — Z853 Personal history of malignant neoplasm of breast: Secondary | ICD-10-CM | POA: Diagnosis not present

## 2020-02-06 DIAGNOSIS — Z7982 Long term (current) use of aspirin: Secondary | ICD-10-CM | POA: Diagnosis not present

## 2020-02-06 DIAGNOSIS — Z794 Long term (current) use of insulin: Secondary | ICD-10-CM | POA: Diagnosis not present

## 2020-02-06 DIAGNOSIS — I10 Essential (primary) hypertension: Secondary | ICD-10-CM | POA: Insufficient documentation

## 2020-02-06 DIAGNOSIS — Z79899 Other long term (current) drug therapy: Secondary | ICD-10-CM | POA: Insufficient documentation

## 2020-02-06 DIAGNOSIS — R300 Dysuria: Secondary | ICD-10-CM | POA: Diagnosis not present

## 2020-02-06 DIAGNOSIS — E119 Type 2 diabetes mellitus without complications: Secondary | ICD-10-CM | POA: Insufficient documentation

## 2020-02-06 DIAGNOSIS — Z87442 Personal history of urinary calculi: Secondary | ICD-10-CM | POA: Diagnosis not present

## 2020-02-06 DIAGNOSIS — R109 Unspecified abdominal pain: Secondary | ICD-10-CM | POA: Diagnosis not present

## 2020-02-06 DIAGNOSIS — N200 Calculus of kidney: Secondary | ICD-10-CM | POA: Diagnosis not present

## 2020-02-06 DIAGNOSIS — N390 Urinary tract infection, site not specified: Secondary | ICD-10-CM | POA: Diagnosis not present

## 2020-02-06 DIAGNOSIS — Z9049 Acquired absence of other specified parts of digestive tract: Secondary | ICD-10-CM | POA: Diagnosis not present

## 2020-02-06 DIAGNOSIS — R1031 Right lower quadrant pain: Secondary | ICD-10-CM | POA: Diagnosis not present

## 2020-02-06 DIAGNOSIS — N3289 Other specified disorders of bladder: Secondary | ICD-10-CM | POA: Diagnosis not present

## 2020-02-06 DIAGNOSIS — R10A0 Flank pain, unspecified side: Secondary | ICD-10-CM

## 2020-02-06 DIAGNOSIS — E785 Hyperlipidemia, unspecified: Secondary | ICD-10-CM | POA: Insufficient documentation

## 2020-02-06 DIAGNOSIS — N201 Calculus of ureter: Secondary | ICD-10-CM | POA: Diagnosis not present

## 2020-02-06 LAB — COMPREHENSIVE METABOLIC PANEL
ALT: 20 U/L (ref 0–44)
AST: 17 U/L (ref 15–41)
Albumin: 4.1 g/dL (ref 3.5–5.0)
Alkaline Phosphatase: 96 U/L (ref 38–126)
Anion gap: 9 (ref 5–15)
BUN: 16 mg/dL (ref 8–23)
CO2: 24 mmol/L (ref 22–32)
Calcium: 9.4 mg/dL (ref 8.9–10.3)
Chloride: 102 mmol/L (ref 98–111)
Creatinine, Ser: 0.72 mg/dL (ref 0.44–1.00)
GFR, Estimated: >60 mL/min (ref >60)
Glucose, Bld: 72 mg/dL (ref 70–99)
Potassium: 3.7 mmol/L (ref 3.5–5.1)
Sodium: 135 mmol/L (ref 135–145)
Total Bilirubin: 0.4 mg/dL (ref 0.3–1.2)
Total Protein: 7.3 g/dL (ref 6.5–8.1)

## 2020-02-06 LAB — LIPASE, BLOOD: Lipase: 22 U/L (ref 11–51)

## 2020-02-06 LAB — CBC
HCT: 42.6 % (ref 36.0–46.0)
Hemoglobin: 13.3 g/dL (ref 12.0–15.0)
MCH: 26.8 pg (ref 26.0–34.0)
MCHC: 31.2 g/dL (ref 30.0–36.0)
MCV: 85.7 fL (ref 80.0–100.0)
Platelets: 212 10*3/uL (ref 150–400)
RBC: 4.97 MIL/uL (ref 3.87–5.11)
RDW: 14.7 % (ref 11.5–15.5)
WBC: 8.1 10*3/uL (ref 4.0–10.5)
nRBC: 0 % (ref 0.0–0.2)

## 2020-02-06 NOTE — ED Triage Notes (Signed)
Pt states she had an xray at [facility] and her PCP called her to tell her she had a small bowel obstruction.  PCP advised her to return to the ED.

## 2020-02-07 ENCOUNTER — Emergency Department (HOSPITAL_COMMUNITY): Payer: 59

## 2020-02-07 DIAGNOSIS — E785 Hyperlipidemia, unspecified: Secondary | ICD-10-CM | POA: Diagnosis not present

## 2020-02-07 DIAGNOSIS — Z853 Personal history of malignant neoplasm of breast: Secondary | ICD-10-CM | POA: Diagnosis not present

## 2020-02-07 DIAGNOSIS — Z9049 Acquired absence of other specified parts of digestive tract: Secondary | ICD-10-CM | POA: Diagnosis not present

## 2020-02-07 DIAGNOSIS — E119 Type 2 diabetes mellitus without complications: Secondary | ICD-10-CM | POA: Diagnosis not present

## 2020-02-07 DIAGNOSIS — N200 Calculus of kidney: Secondary | ICD-10-CM | POA: Diagnosis not present

## 2020-02-07 DIAGNOSIS — N3289 Other specified disorders of bladder: Secondary | ICD-10-CM | POA: Diagnosis not present

## 2020-02-07 DIAGNOSIS — N201 Calculus of ureter: Secondary | ICD-10-CM | POA: Diagnosis not present

## 2020-02-07 DIAGNOSIS — I10 Essential (primary) hypertension: Secondary | ICD-10-CM | POA: Diagnosis not present

## 2020-02-07 DIAGNOSIS — Z7982 Long term (current) use of aspirin: Secondary | ICD-10-CM | POA: Diagnosis not present

## 2020-02-07 DIAGNOSIS — Z794 Long term (current) use of insulin: Secondary | ICD-10-CM | POA: Diagnosis not present

## 2020-02-07 DIAGNOSIS — Z79899 Other long term (current) drug therapy: Secondary | ICD-10-CM | POA: Diagnosis not present

## 2020-02-07 LAB — URINALYSIS, ROUTINE W REFLEX MICROSCOPIC
Bacteria, UA: NONE SEEN
Bilirubin Urine: NEGATIVE
Glucose, UA: NEGATIVE mg/dL
Ketones, ur: NEGATIVE mg/dL
Nitrite: NEGATIVE
Protein, ur: NEGATIVE mg/dL
Specific Gravity, Urine: 1.02 (ref 1.005–1.030)
pH: 5 (ref 5.0–8.0)

## 2020-02-07 MED ORDER — CEPHALEXIN 500 MG PO CAPS: 500.0000 mg | ORAL_CAPSULE | Freq: Two times a day (BID) | ORAL | 0 refills | Status: DC

## 2020-02-07 MED ORDER — IOHEXOL 300 MG/ML  SOLN
100.0000 mL | Freq: Once | INTRAMUSCULAR | Status: AC | PRN
  Administered 2020-02-07: 100 mL via INTRAVENOUS

## 2020-02-07 MED ORDER — METHOCARBAMOL 500 MG PO TABS: 500.0000 mg | ORAL_TABLET | Freq: Three times a day (TID) | ORAL | 0 refills | Status: DC | PRN

## 2020-02-07 MED ORDER — MELOXICAM 7.5 MG PO TABS: 7.5000 mg | ORAL_TABLET | Freq: Every day | ORAL | 0 refills | Status: DC

## 2020-02-07 NOTE — ED Notes (Signed)
Patient transported to CT 

## 2020-02-07 NOTE — ED Provider Notes (Signed)
[name] Provider Note   CSN: [id number] Arrival date & time: 02/06/20  2003     History Chief Complaint  Patient presents with  . Abdominal Pain    Alexis [name] is a 65 y.o. female.  Patient presents to the emergency department for evaluation of possible small bowel obstruction.  Patient has been experiencing right flank pain for a week and a half.  She has mild to moderate pain in the right lower back when she is at rest but this pain significantly worsens if she moves.  Pain does not radiate to legs.  She has not noticed any urinary symptoms.  Patient had an x-ray performed earlier today and was told she might have a small bowel obstruction.  She has never had an obstruction but has had multiple surgeries.  Patient denies nausea, vomiting.  She has been having regular bowel movements.        Past Medical History:  Diagnosis Date  . ASCUS of cervix with negative high risk HPV 09/2016  . Breast cancer ([place]) 2019   Left Breast Lumpectomy   . Cancer ([place])   . DDD (degenerative disc disease), lumbar   . Diabetes mellitus without complication (HCC)    Type 2, IDDM  . Diverticulitis   . Diverticulitis   . Facial trauma    forehead,nose and upper lip secondary to fall   . Fall   . GERD without esophagitis   . History of bronchitis   . History of colon polyps   . History of kidney stones   . History of urinary tract infection   . Hyperparathyroidism ([place])   . Parathyroid tumor   . Personal history of radiation therapy 2019   Right Breast Cancer  . Plantar fasciitis   . PONV (postoperative nausea and vomiting)    emotional when awakens / cries  . Seasonal allergies   . 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 Vitamin D deficiency   . Yeast infection    history of     Patient Active Problem List   Diagnosis Date Noted  . Swelling of lower extremity 01/26/2019  . Lymphedema 10/20/2018  . Personal history of  radiation therapy 10/20/2018  . Carcinoma of upper-outer quadrant of left breast in female, estrogen receptor positive ([place]) 10/09/2017  . Plantar fasciitis 09/08/2017  . Primary hyperparathyroidism ([place]) 02/21/2015  . Hyperlipidemia associated with type 2 diabetes mellitus ([place]) 02/05/2014  . Hypertension associated with type 2 diabetes mellitus ([place]) 02/05/2014  . Type 2 diabetes mellitus without complication, with long-term current use of insulin ([place]) 10/18/2013    Past Surgical History:  Procedure Laterality Date  . ABDOMINAL HYSTERECTOMY    . APPENDECTOMY    . BREAST LUMPECTOMY Left 10/11/2017  . CARPAL TUNNEL RELEASE     right and left   . CESAREAN SECTION    . CHOLECYSTECTOMY    . COLON SURGERY    . colonscopy     . DILATION AND CURETTAGE OF UTERUS    . HYSTEROSCOPY  2003  . LAVH  2004   leiomyoma/irregular bleeding  . PARATHYROIDECTOMY N/A 02/21/2015   Procedure: RIGHT PARATHYROIDECTOMY;  Surgeon: [name], MD;  Location: [facility];  Service: General;  Laterality: N/A;  . PARTIAL MASTECTOMY WITH AXILLARY SENTINEL LYMPH NODE BIOPSY Left 10/11/2017   Procedure: LEFT BREAST PARTIAL MASTECTOMY WITH AXILLARY SENTINEL LYMPH NODE BIOPSY;  Surgeon: [name], MD;  Location: [facility];  Service: General;  Laterality: Left;  . TUBAL LIGATION       OB History    Gravida  3   Para  2   Term  2   Preterm      AB  1   Living  2     SAB  1   IAB      Ectopic      Multiple      Live Births              Family History  Problem Relation Age of Onset  . Heart attack Father   . Diabetes Brother   . Cancer Brother        Lung cancer  . Hyperlipidemia Mother   . Hypertension Mother     Social History   Tobacco Use  . Smoking status: Never Smoker  . Smokeless tobacco: Never Used  Vaping Use  . Vaping Use: Never used  Substance Use Topics  . Alcohol use: No    Alcohol/week: 0.0 standard drinks  . Drug use: No    Home  Medications Prior to Admission medications   Medication Sig Start Date End Date Taking? Authorizing Provider  cephALEXin (KEFLEX) 500 MG capsule Take 1 capsule (500 mg total) by mouth 2 (two) times daily. 02/07/20  Yes Acen Craun, Gwenyth Allegra, MD  meloxicam (MOBIC) 7.5 MG tablet Take 1 tablet (7.5 mg total) by mouth daily. 02/07/20  Yes Kiptyn Rafuse, Gwenyth Allegra, MD  methocarbamol (ROBAXIN) 500 MG tablet Take 1 tablet (500 mg total) by mouth every 8 (eight) hours as needed for muscle spasms. 02/07/20  Yes Ulisses Vondrak, Gwenyth Allegra, MD  ACCU-CHEK FASTCLIX LANCETS MISC USE 3 TIMES A DAY AS DIRECTED 09/23/17   [provider]  ACCU-CHEK GUIDE test strip USE 3 TIMES A DAY AS DIRECTED 09/23/17   [provider]  aspirin 81 MG tablet Take 81 mg by mouth daily.     [provider]  bumetanide (BUMEX) 2 MG tablet Take 1 tablet (2 mg total) by mouth daily. 08/14/19   Nicholas Lose, MD  cetirizine (ZYRTEC) 10 MG tablet Take 10 mg by mouth daily.    [provider]  Cholecalciferol (VITAMIN D PO) Take by mouth.    [provider]  Dulaglutide (TRULICITY [place]) Inject into the skin.    [provider]  Insulin Regular Human (HUMULIN R U-500 KWIKPEN [place]) Inject into the skin. Takes 100 am, 70 lunch, 100 dinner    [provider]  letrozole (FEMARA) 2.5 MG tablet Take 1 tablet (2.5 mg total) by mouth daily. 04/26/19   Nicholas Lose, MD  potassium chloride SA (KLOR-CON) 20 MEQ tablet Take 1 tablet (20 mEq total) by mouth 2 (two) times daily. 07/27/19   Nicholas Lose, MD  UNIFINE PENTIPS 31G X 5 MM MISC USE TWICE A DAY AS DIRECTED 09/23/17   [provider]    Allergies    Canagliflozin, Empagliflozin, Metformin hcl, and Parlodel [bromocriptine mesylate]  Review of Systems   Review of Systems  Genitourinary: Positive for flank pain.  All other systems reviewed and are negative.   Physical Exam Updated Vital Signs BP (!) 151/56 (BP Location: Right Arm)    Pulse 65   Temp 98 F (36.7 C) (Oral)   Resp 18   Ht 5\' 4"  (1.626 m)   Wt 102.5 kg   SpO2 96%   BMI 38.79 kg/m   Physical Exam Vitals and nursing note reviewed.  Constitutional:      General:  She is not in acute distress.    Appearance: Normal appearance. She is well-developed and well-nourished.  HENT:     Head: Normocephalic and atraumatic.     Right Ear: Hearing normal.     Left Ear: Hearing normal.     Nose: Nose normal.     Mouth/Throat:     Mouth: Oropharynx is clear and moist and mucous membranes are normal.  Eyes:     Extraocular Movements: EOM normal.     Conjunctiva/sclera: Conjunctivae normal.     Pupils: Pupils are equal, round, and reactive to light.  Cardiovascular:     Rate and Rhythm: Regular rhythm.     Heart sounds: S1 normal and S2 normal. No murmur heard. No friction rub. No gallop.   Pulmonary:     Effort: Pulmonary effort is normal. No respiratory distress.     Breath sounds: Normal breath sounds.  Chest:     Chest wall: No tenderness.  Abdominal:     General: Bowel sounds are normal.     Palpations: Abdomen is soft. There is no hepatosplenomegaly.     Tenderness: There is no abdominal tenderness. There is no guarding or rebound. Negative signs include Murphy's sign and McBurney's sign.     Hernia: No hernia is present.  Musculoskeletal:        General: Normal range of motion.     Cervical back: Normal range of motion and neck supple.  Skin:    General: Skin is warm, dry and intact.     Findings: No rash.     Nails: There is no cyanosis.  Neurological:     Mental Status: She is alert and oriented to person, place, and time.     GCS: GCS eye subscore is 4. GCS verbal subscore is 5. GCS motor subscore is 6.     Cranial Nerves: No cranial nerve deficit.     Sensory: No sensory deficit.     Coordination: Coordination normal.     Deep Tendon Reflexes: Strength normal.  Psychiatric:        Mood and Affect: Mood and affect normal.         Speech: Speech normal.        Behavior: Behavior normal.        Thought Content: Thought content normal.     ED Results / Procedures / Treatments   Labs (all labs ordered are listed, but only abnormal results are displayed) Labs Reviewed  URINALYSIS, ROUTINE W REFLEX MICROSCOPIC - Abnormal; Notable for the following components:      Result Value   Hgb urine dipstick MODERATE (*)    Leukocytes,Ua MODERATE (*)    All other components within normal limits  LIPASE, BLOOD  COMPREHENSIVE METABOLIC PANEL  CBC    EKG None  Radiology CT ABDOMEN PELVIS W CONTRAST  Result Date: 02/07/2020 CLINICAL DATA:  Right flank pain. EXAM: CT ABDOMEN AND PELVIS WITH CONTRAST TECHNIQUE: Multidetector CT imaging of the abdomen and pelvis was performed using the standard protocol following bolus administration of intravenous contrast. CONTRAST:  198mL OMNIPAQUE IOHEXOL 300 MG/ML  SOLN COMPARISON:  CT dated December 17, 2016 FINDINGS: Lower chest: The lung bases are clear. The heart size is normal. Hepatobiliary: The liver is normal. Status post cholecystectomy.There is no biliary ductal dilation. Pancreas: Normal contours without ductal dilatation. No peripancreatic fluid collection. Spleen: Unremarkable. Adrenals/Urinary Tract: --Adrenal glands: Unremarkable. --Right kidney/ureter: There is a 6 mm stone in the right renal pelvis. No evidence for right-sided hydronephrosis. --  Left kidney/ureter: No hydronephrosis or radiopaque kidney stones. --Urinary bladder: There is mild bladder wall thickening with adjacent fat stranding. Stomach/Bowel: --Stomach/Duodenum: No hiatal hernia or other gastric abnormality. Normal duodenal course and caliber. --Small bowel: Unremarkable. --Colon: The patient appears to be status post partial right hemicolectomy. --Appendix: Surgically absent. Vascular/Lymphatic: Normal course and caliber of the major abdominal vessels. --No retroperitoneal lymphadenopathy. --No mesenteric  lymphadenopathy. --No pelvic or inguinal lymphadenopathy. Reproductive: Status post hysterectomy. No adnexal mass. Other: No ascites or free air. The abdominal wall is normal. Musculoskeletal. No acute displaced fractures. IMPRESSION: 1. There is a 6 mm stone in the right renal pelvis. No evidence for right-sided hydronephrosis. 2. There is mild bladder wall thickening with adjacent fat stranding. Correlate with urinalysis for possible cystitis. Electronically Signed   By: Constance Holster M.D.   On: 02/07/2020 02:14    Procedures Procedures   Medications Ordered in ED Medications  iohexol (OMNIPAQUE) 300 MG/ML solution 100 mL (100 mLs Intravenous Contrast Given 02/07/20 0147)    ED Course  I have reviewed the triage vital signs and the nursing notes.  Pertinent labs & imaging results that were available during my care of the patient were reviewed by me and considered in my medical decision making (see chart for details).    MDM Rules/Calculators/A&P                          Patient presents to the emergency department for evaluation of right flank pain.  Patient reportedly had an outpatient x-ray today that was suspicious for small bowel obstruction.  Symptoms do not seem consistent with SBO.  She is not experiencing any vomiting, is having bowel movements.  She has bowel sounds present on exam.  Pain is entirely in the right lower back and flank area.  This is significantly exacerbated by movement which raises concern for musculoskeletal pain.  CT scan does not show any evidence of obstruction.  She does have a nonobstructive right renal pelvis stone which might be intermittently obstructing.  Possible cystitis on CT scan.  She does have white and red cells without bacteria.  Empiric antibiotic coverage, follow-up with urology.  Final Clinical Impression(s) / ED Diagnoses Final diagnoses:  Renal stone  Flank pain    Rx / DC Orders ED Discharge Orders         Ordered    meloxicam  (MOBIC) 7.5 MG tablet  Daily        02/07/20 0228    methocarbamol (ROBAXIN) 500 MG tablet  Every 8 hours PRN        02/07/20 0228    cephALEXin (KEFLEX) 500 MG capsule  2 times daily        02/07/20 0228           Orpah Greek, MD 02/07/20 [phone]

## 2020-02-07 NOTE — ED Notes (Signed)
Pt up to bathroom, urine sample obtained and sent to lab, returned to bed. VS updated. C/o 4/10 pain to R flank when still, 9/10 with movement/ambulation. Says she had an xray today to check for a kidney stone and they found a small bowel obstruction. Denies N/V/D, denies urinary symptoms at this time. NAD noted. Will continue to monitor.

## 2020-02-09 LAB — URINE CULTURE: Culture: NO GROWTH

## 2020-02-11 DIAGNOSIS — E119 Type 2 diabetes mellitus without complications: Secondary | ICD-10-CM | POA: Diagnosis not present

## 2020-02-11 DIAGNOSIS — S30861A Insect bite (nonvenomous) of abdominal wall, initial encounter: Secondary | ICD-10-CM | POA: Diagnosis not present

## 2020-02-11 DIAGNOSIS — R1110 Vomiting, unspecified: Secondary | ICD-10-CM | POA: Diagnosis not present

## 2020-02-11 DIAGNOSIS — Z794 Long term (current) use of insulin: Secondary | ICD-10-CM | POA: Diagnosis not present

## 2020-02-11 DIAGNOSIS — E785 Hyperlipidemia, unspecified: Secondary | ICD-10-CM | POA: Diagnosis not present

## 2020-02-11 DIAGNOSIS — E042 Nontoxic multinodular goiter: Secondary | ICD-10-CM | POA: Diagnosis not present

## 2020-02-11 DIAGNOSIS — S30860A Insect bite (nonvenomous) of lower back and pelvis, initial encounter: Secondary | ICD-10-CM | POA: Diagnosis not present

## 2020-02-11 DIAGNOSIS — Z8639 Personal history of other endocrine, nutritional and metabolic disease: Secondary | ICD-10-CM | POA: Diagnosis not present

## 2020-03-10 DIAGNOSIS — N200 Calculus of kidney: Secondary | ICD-10-CM | POA: Diagnosis not present

## 2020-03-17 ENCOUNTER — Other Ambulatory Visit (HOSPITAL_COMMUNITY): Payer: Self-pay | Admitting: Pharmacist

## 2020-03-19 ENCOUNTER — Other Ambulatory Visit: Payer: Self-pay

## 2020-03-19 ENCOUNTER — Ambulatory Visit: Payer: 59 | Admitting: Physician Assistant

## 2020-03-19 ENCOUNTER — Encounter: Payer: Self-pay | Admitting: Physician Assistant

## 2020-03-19 DIAGNOSIS — L821 Other seborrheic keratosis: Secondary | ICD-10-CM

## 2020-03-19 DIAGNOSIS — D485 Neoplasm of uncertain behavior of skin: Secondary | ICD-10-CM

## 2020-03-19 DIAGNOSIS — L7211 Pilar cyst: Secondary | ICD-10-CM

## 2020-03-19 DIAGNOSIS — Z1283 Encounter for screening for malignant neoplasm of skin: Secondary | ICD-10-CM

## 2020-03-19 DIAGNOSIS — B079 Viral wart, unspecified: Secondary | ICD-10-CM

## 2020-03-19 NOTE — Patient Instructions (Signed)

## 2020-04-03 ENCOUNTER — Encounter: Payer: Self-pay | Admitting: Physician Assistant

## 2020-04-03 NOTE — Progress Notes (Signed)
   Follow-Up Visit   Subjective  Alexis [name] is a 65 y.o. female who presents for the following: Annual Exam (Full body skin check./Dark spots have started coming up on left side and back x year, seems to be getting darker. Bite by a tick x 2 years, spot originated, continues to get bigger, gets flaky and peels.).   The following portions of the chart were reviewed this encounter and updated as appropriate:      Objective  Well appearing patient in no apparent distress; mood and affect are within normal limits.  A full examination was performed including scalp, head, eyes, ears, nose, lips, neck, chest, axillae, abdomen, back, buttocks, bilateral upper extremities, bilateral lower extremities, hands, feet, fingers, toes, fingernails, and toenails. All findings within normal limits unless otherwise noted below.  Objective  head to toe: No atypical nevi No signs of non-mole skin cancer.   Objective  Right 3rd Finger Proximal Interphalangeal Joint: White dense nodule     Objective  Right Forehead: Verrucous papules -- Discussed viral etiology and contagion.   Objective  Right Lower Back: Multiply Stuck-on, waxy, tan-brown papules and plaques. --Discussed benign etiology and prognosis.    Assessment & Plan  Encounter for screening for malignant neoplasm of skin head to toe  Yearly skin check  Neoplasm of uncertain behavior of skin Right 3rd Finger Proximal Interphalangeal Joint  Skin / nail biopsy Type of biopsy: tangential   Informed consent: discussed and consent obtained   Timeout: patient name, date of birth, surgical site, and procedure verified   Procedure prep:  Patient was prepped and draped in usual sterile fashion (Non sterile) Prep type:  Chlorhexidine Anesthesia: the lesion was anesthetized in a standard fashion   Anesthetic:  1% lidocaine w/ epinephrine 1-100,000 local infiltration Instrument used: flexible razor blade   Outcome: patient  tolerated procedure well   Post-procedure details: wound care instructions given    Specimen 1 - Surgical pathology Differential Diagnosis: bcc vs scc  Check Margins: No  Viral warts, unspecified type Right Forehead  Destruction of lesion - Right Forehead Complexity: simple   Destruction method: cryotherapy   Destruction method comment:  Scissors were used to snip tag at the base Informed consent: discussed and consent obtained   Timeout:  patient name, date of birth, surgical site, and procedure verified Anesthesia: the lesion was anesthetized in a standard fashion   Cryotherapy cycles:  3 Hemostasis achieved with:  pressure Outcome: patient tolerated procedure well with no complications   Post-procedure details: wound care instructions given    Seborrheic keratosis Right Lower Back  observe    I, Margareta Laureano, PA-C, have reviewed all documentation's for this visit.  The documentation on 04/03/20 for the exam, diagnosis, procedures and orders are all accurate and complete.

## 2020-04-18 ENCOUNTER — Ambulatory Visit: Payer: 59 | Admitting: Allergy

## 2020-05-08 ENCOUNTER — Other Ambulatory Visit (HOSPITAL_COMMUNITY): Payer: Self-pay

## 2020-05-08 DIAGNOSIS — N200 Calculus of kidney: Secondary | ICD-10-CM | POA: Diagnosis not present

## 2020-05-08 MED ORDER — CEPHALEXIN 500 MG PO CAPS
500.0000 mg | ORAL_CAPSULE | Freq: Two times a day (BID) | ORAL | 0 refills | Status: DC
  Filled 2020-05-08: qty 28, 7d supply, fill #0

## 2020-05-08 MED ORDER — HYDROCODONE-ACETAMINOPHEN 5-325 MG PO TABS
ORAL_TABLET | ORAL | 0 refills | Status: DC
  Filled 2020-05-08: qty 20, 5d supply, fill #0

## 2020-05-08 MED ORDER — CEPHALEXIN 500 MG PO CAPS
ORAL_CAPSULE | ORAL | 0 refills | Status: DC
  Filled 2020-05-08: qty 28, 7d supply, fill #0

## 2020-05-09 ENCOUNTER — Other Ambulatory Visit (HOSPITAL_COMMUNITY): Payer: Self-pay

## 2020-05-09 DIAGNOSIS — E119 Type 2 diabetes mellitus without complications: Secondary | ICD-10-CM | POA: Diagnosis not present

## 2020-05-09 DIAGNOSIS — Z794 Long term (current) use of insulin: Secondary | ICD-10-CM | POA: Diagnosis not present

## 2020-05-09 DIAGNOSIS — N200 Calculus of kidney: Secondary | ICD-10-CM | POA: Diagnosis not present

## 2020-05-09 DIAGNOSIS — E785 Hyperlipidemia, unspecified: Secondary | ICD-10-CM | POA: Diagnosis not present

## 2020-05-09 DIAGNOSIS — Z8639 Personal history of other endocrine, nutritional and metabolic disease: Secondary | ICD-10-CM | POA: Diagnosis not present

## 2020-05-09 DIAGNOSIS — E042 Nontoxic multinodular goiter: Secondary | ICD-10-CM | POA: Diagnosis not present

## 2020-05-09 MED ORDER — TRULICITY 1.5 MG/0.5ML ~~LOC~~ SOAJ
SUBCUTANEOUS | 4 refills | Status: DC
  Filled 2020-05-09 – 2020-06-04 (×2): qty 6, 84d supply, fill #0
  Filled 2020-08-28: qty 6, 84d supply, fill #1
  Filled 2020-12-02: qty 6, 84d supply, fill #2

## 2020-05-12 ENCOUNTER — Other Ambulatory Visit (HOSPITAL_COMMUNITY): Payer: Self-pay

## 2020-05-12 DIAGNOSIS — N200 Calculus of kidney: Secondary | ICD-10-CM | POA: Diagnosis not present

## 2020-05-13 ENCOUNTER — Other Ambulatory Visit: Payer: Self-pay | Admitting: Urology

## 2020-05-21 ENCOUNTER — Other Ambulatory Visit: Payer: Self-pay

## 2020-05-21 ENCOUNTER — Encounter (HOSPITAL_BASED_OUTPATIENT_CLINIC_OR_DEPARTMENT_OTHER): Payer: Self-pay | Admitting: Urology

## 2020-05-21 NOTE — Progress Notes (Signed)
Spoke w/ via phone for pre-op interview--- PT Lab needs dos---- Istat and EKG              Lab results------ no COVID test ------ 05-23-2020 @ 8850 Arrive at ------- 0530 on 05-27-2020 NPO after MN NO Solid Food.  Clear liquids from MN until--- 0430 Med rec completed Medications to take morning of surgery ----- Zyrtec, Letrozole, Prilosec Diabetic medication ----- do not do insulin morning of surgeyr Patient instructed to bring photo id and insurance card day of surgery Patient aware to have Driver (ride ) / caregiver    for 24 hours after surgery -- daughter, Lynn Ito Patient Special Instructions ----- n/a Pre-Op special Istructions ----- n/a Patient verbalized understanding of instructions that were given at this phone interview. Patient denies shortness of breath, chest pain, fever, cough at this phone interview.

## 2020-05-23 ENCOUNTER — Other Ambulatory Visit (HOSPITAL_COMMUNITY)
Admission: RE | Admit: 2020-05-23 | Discharge: 2020-05-23 | Disposition: A | Discharge status: Home / Self Care | Payer: 59 | Source: Ambulatory Visit | Attending: Urology | Admitting: Urology

## 2020-05-23 DIAGNOSIS — Z20822 Contact with and (suspected) exposure to covid-19: Secondary | ICD-10-CM | POA: Diagnosis not present

## 2020-05-23 DIAGNOSIS — Z01812 Encounter for preprocedural laboratory examination: Secondary | ICD-10-CM | POA: Insufficient documentation

## 2020-05-24 LAB — SARS CORONAVIRUS 2 (TAT 6-24 HRS): SARS Coronavirus 2: NEGATIVE

## 2020-05-26 NOTE — H&P (Signed)
CC/HPI: cc: Urolithiasis   03/10/20: 65 year old woman found have a 6 mm right renal pelvis calculus on CT of abdomen pelvis on 02/07/2020. She was first told she had a SBO and was sent to ED. Imaging revealed the kidney stone which patient states has been present since 2011. She's never passed a stone or undergone surgical intervention for stones. She denies any gross hematuria. She had right flank pain that has since resolved.   05/08/20: 65 year old woman with a 6 mm right renal pelvis calculus last seen in February 2022 returns for follow-up with continued right flank pain and microscopic hematuria. She states this pain is worsening and now traveling to her right lower quadrant. Urinalysis today shows 20-40 RBCs and few bacteria. She denies fevers, chills, nausea or vomiting.     ALLERGIES: Parlodel TABS    MEDICATIONS: Humulin R U-500 Kwikpen     GU PSH: No GU PSH      PSH Notes: Complete Colonoscopy For Polyp Removal, Cholecystectomy Laparoscopic, Hysteroscopy, Neuroplasty Decompression Median Nerve At Carpal Tunnel, Partial Colectomy   NON-GU PSH: Carpal Tunnel Surgery.. - 2011 Cholecystectomy (laparoscopic) - 2011 Hysteroscopy; Biopsy - 2011 Lesion Remove Colonoscopy - 2011 Partial colectomy - 2011     GU PMH: Renal calculus, Patient has a 6 mm right renal pelvis stone that is currently not bothering her. We discussed management including observation, ESWL and ureteroscopy with laser lithotripsy and stent. Pt stone is not visible on KUB and so ESWL is not an option. She would like to think about URS/LL and will let me know if she would like to proceed. Otherwise will follow up in 6 months to assess for change. - 03/10/2020 Acute Cystitis/UTI, Acute cystitis without hematuria - 2014 Hematuria, Unspec, Hematuria - 2014 History of urolithiasis, Nephrolithiasis - 2014 RLQ pain, Abdominal pain, RLQ (right lower quadrant) - 2014 Urinary Retention, Unspec, Incomplete bladder emptying -  2014      PMH Notes:  2009-09-09 16:10:29 - Note: Feelings Of Urinary Urgency Sudden   NON-GU PMH: Personal history of other endocrine, nutritional and metabolic disease, History of hypothyroidism - 2014, History of diabetes mellitus, - 2014, History of hypercholesterolemia, - 2014 Personal history of other specified conditions, History of heartburn - 2014 Breast Cancer, History Diabetes Type 2 Encounter for general adult medical examination without abnormal findings, Encounter for preventive health examination GERD Hypercholesterolemia    FAMILY HISTORY: Acute Myocardial Infarction - Father Death In The Family Father - Runs In Family Death In The Family Mother - Mother Family Health Status Number - Runs In Family nephrolithiasis - Brother   SOCIAL HISTORY: Marital Status: Single Preferred Language: English; Ethnicity: Not Hispanic Or Latino; Race: White Current Smoking Status: Patient has never smoked.   Tobacco Use Assessment Completed: Used Tobacco in last 30 days? Has never drank.  Does not drink caffeine.     Notes: Alcohol Use, Tobacco Use, Marital History - Divorced, Occupation:, Caffeine Use   REVIEW OF SYSTEMS:    GU Review Female:   Patient denies frequent urination, hard to postpone urination, burning /pain with urination, get up at night to urinate, leakage of urine, stream starts and stops, trouble starting your stream, have to strain to urinate, and being pregnant.  Gastrointestinal (Upper):   Patient denies nausea, vomiting, and indigestion/ heartburn.  Gastrointestinal (Lower):   Patient denies diarrhea and constipation.  Constitutional:   Patient denies fever, night sweats, weight loss, and fatigue.  Skin:   Patient denies skin rash/ lesion and itching.  Eyes:  Patient denies blurred vision and double vision.  Ears/ Nose/ Throat:   Patient denies sore throat and sinus problems.  Hematologic/Lymphatic:   Patient denies swollen glands and easy bruising.   Cardiovascular:   Patient denies leg swelling and chest pains.  Respiratory:   Patient denies cough and shortness of breath.  Endocrine:   Patient denies excessive thirst.  Musculoskeletal:   Patient denies back pain and joint pain.  Neurological:   Patient denies headaches and dizziness.  Psychologic:   Patient denies depression and anxiety.   VITAL SIGNS: None   MULTI-SYSTEM PHYSICAL EXAMINATION:    Constitutional: Well-nourished. No physical deformities. Normally developed. Good grooming.  Neck: Neck symmetrical, not swollen. Normal tracheal position.  Respiratory: No labored breathing, no use of accessory muscles.   Skin: No paleness, no jaundice, no cyanosis. No lesion, no ulcer, no rash.  Neurologic / Psychiatric: Oriented to time, oriented to place, oriented to person. No depression, no anxiety, no agitation.  Gastrointestinal: No rigidity  Eyes: Normal conjunctivae. Normal eyelids.  Ears, Nose, Mouth, and Throat: Left ear no scars, no lesions, no masses. Right ear no scars, no lesions, no masses. Nose no scars, no lesions, no masses. Normal hearing. Normal lips.  Musculoskeletal: Normal gait and station of head and neck.     Complexity of Data:  Records Review:   Previous Patient Records, POC Tool  Urine Test Review:   Urinalysis  X-Ray Review: KUB: Reviewed Films. Discussed With Patient.    Notes:                     KUB: Difficult to visualize stone   PROCEDURES:         KUB - 74018  A single view of the abdomen is obtained.      . Patient confirmed No Neulasta OnPro Device.           Urinalysis w/Scope Dipstick Dipstick Cont'd Micro  Color: Yellow Bilirubin: Neg mg/dL WBC/hpf: 0 - 5/hpf  Appearance: Slightly Cloudy Ketones: Neg mg/dL RBC/hpf: 20 - 40/hpf  Specific Gravity: 1.025 Blood: 3+ ery/uL Bacteria: Few (10-25/hpf)  pH: 5.5 Protein: Trace mg/dL Cystals: NS (Not Seen)  Glucose: Neg mg/dL Urobilinogen: 0.2 mg/dL Casts: NS (Not Seen)    Nitrites: Neg  Trichomonas: Not Present    Leukocyte Esterase: Neg leu/uL Mucous: Present      Epithelial Cells: 0 - 5/hpf      Yeast: NS (Not Seen)      Sperm: Not Present    ASSESSMENT:      ICD-10 Details  1 GU:   Renal calculus - E42.3 Acute, Uncomplicated - Patient with persistent pain and evidence of microscopic hematuria consistent with not passing a kidney stone. As it is difficult to visualize and KUB I discussed with patient proceeding with right ureteroscopy with laser lithotripsy and stent placement. The risks and benefits of the procedure were discussed with the patient in detail including but not limited to pain, bleeding, infection, need for future treatment, inability removed stone, need for staged procedure, damage to surrounding structures, stent discomfort. Patient has like to proceed all be scheduled for surgery.

## 2020-05-26 NOTE — Anesthesia Preprocedure Evaluation (Addendum)
Anesthesia Evaluation  Patient identified by MRN, date of birth, ID band Patient awake    Reviewed: Allergy & Precautions, NPO status , Patient's Chart, lab work & pertinent test results  History of Anesthesia Complications (+) PONV and history of anesthetic complications  Airway Mallampati: III  TM Distance: >3 FB Neck ROM: Full    Dental  (+) Dental Advisory Given, Teeth Intact   Pulmonary pneumonia,    Pulmonary exam normal breath sounds clear to auscultation       Cardiovascular hypertension, Pt. on medications Normal cardiovascular exam Rhythm:Regular Rate:Normal     Neuro/Psych    GI/Hepatic   Endo/Other  diabetes, Type 2, Oral Hypoglycemic AgentsMorbid obesity  Renal/GU Renal disease     Musculoskeletal  (+) Arthritis ,   Abdominal (+) + obese,   Peds  Hematology   Anesthesia Other Findings   Reproductive/Obstetrics                            Anesthesia Physical  Anesthesia Plan  ASA: III  Anesthesia Plan: General   Post-op Pain Management:    Induction: Intravenous  PONV Risk Score and Plan: 4 or greater and Dexamethasone, Ondansetron, Midazolam, Treatment may vary due to age or medical condition and Diphenhydramine  Airway Management Planned: LMA  Additional Equipment: None  Intra-op Plan:   Post-operative Plan: Extubation in OR  Informed Consent: I have reviewed the patients History and Physical, chart, labs and discussed the procedure including the risks, benefits and alternatives for the proposed anesthesia with the patient or authorized representative who has indicated his/her understanding and acceptance.     Dental advisory given  Plan Discussed with: CRNA  Anesthesia Plan Comments:        Anesthesia Quick Evaluation

## 2020-05-27 ENCOUNTER — Encounter (HOSPITAL_BASED_OUTPATIENT_CLINIC_OR_DEPARTMENT_OTHER): Payer: Self-pay | Admitting: Urology

## 2020-05-27 ENCOUNTER — Ambulatory Visit (HOSPITAL_BASED_OUTPATIENT_CLINIC_OR_DEPARTMENT_OTHER): Payer: 59 | Admitting: Anesthesiology

## 2020-05-27 ENCOUNTER — Ambulatory Visit (HOSPITAL_BASED_OUTPATIENT_CLINIC_OR_DEPARTMENT_OTHER)
Admission: RE | Admit: 2020-05-27 | Discharge: 2020-05-27 | Disposition: A | Discharge status: Home / Self Care | Payer: 59 | Attending: Urology | Admitting: Urology

## 2020-05-27 ENCOUNTER — Other Ambulatory Visit (HOSPITAL_COMMUNITY): Payer: Self-pay

## 2020-05-27 ENCOUNTER — Encounter (HOSPITAL_BASED_OUTPATIENT_CLINIC_OR_DEPARTMENT_OTHER)
Admission: RE | Disposition: A | Discharge status: Home / Self Care | Payer: Self-pay | Source: Home / Self Care | Attending: Urology

## 2020-05-27 DIAGNOSIS — E119 Type 2 diabetes mellitus without complications: Secondary | ICD-10-CM | POA: Diagnosis not present

## 2020-05-27 DIAGNOSIS — N200 Calculus of kidney: Secondary | ICD-10-CM | POA: Diagnosis not present

## 2020-05-27 DIAGNOSIS — N202 Calculus of kidney with calculus of ureter: Secondary | ICD-10-CM | POA: Diagnosis not present

## 2020-05-27 DIAGNOSIS — Z794 Long term (current) use of insulin: Secondary | ICD-10-CM | POA: Diagnosis not present

## 2020-05-27 DIAGNOSIS — Z888 Allergy status to other drugs, medicaments and biological substances status: Secondary | ICD-10-CM | POA: Diagnosis not present

## 2020-05-27 DIAGNOSIS — I10 Essential (primary) hypertension: Secondary | ICD-10-CM | POA: Diagnosis not present

## 2020-05-27 DIAGNOSIS — Z7984 Long term (current) use of oral hypoglycemic drugs: Secondary | ICD-10-CM | POA: Diagnosis not present

## 2020-05-27 HISTORY — DX: Nontoxic multinodular goiter: E04.2

## 2020-05-27 HISTORY — DX: Personal history of adenomatous and serrated colon polyps: Z86.0101

## 2020-05-27 HISTORY — DX: Type 2 diabetes mellitus without complications: E11.9

## 2020-05-27 HISTORY — PX: HOLMIUM LASER APPLICATION: SHX5852

## 2020-05-27 HISTORY — DX: Personal history of other endocrine, nutritional and metabolic disease: Z86.39

## 2020-05-27 HISTORY — PX: CYSTOSCOPY WITH RETROGRADE PYELOGRAM, URETEROSCOPY AND STENT PLACEMENT: SHX5789

## 2020-05-27 HISTORY — DX: Calculus of kidney: N20.0

## 2020-05-27 HISTORY — DX: Personal history of vaginal dysplasia: Z87.411

## 2020-05-27 HISTORY — DX: Personal history of other diseases of the digestive system: Z87.19

## 2020-05-27 HISTORY — DX: Type 2 diabetes mellitus without complications: Z79.4

## 2020-05-27 HISTORY — DX: Personal history of colonic polyps: Z86.010

## 2020-05-27 HISTORY — DX: Malignant neoplasm of upper-outer quadrant of left female breast: Z17.0

## 2020-05-27 HISTORY — DX: Estrogen receptor positive status (ER+): C50.412

## 2020-05-27 LAB — POCT I-STAT, CHEM 8
BUN: 10 mg/dL (ref 8–23)
Calcium, Ion: 1.25 mmol/L (ref 1.15–1.40)
Chloride: 104 mmol/L (ref 98–111)
Creatinine, Ser: 0.6 mg/dL (ref 0.44–1.00)
Glucose, Bld: 145 mg/dL — ABNORMAL HIGH (ref 70–99)
HCT: 40 % (ref 36.0–46.0)
Hemoglobin: 13.6 g/dL (ref 12.0–15.0)
Potassium: 4 mmol/L (ref 3.5–5.1)
Sodium: 140 mmol/L (ref 135–145)
TCO2: 26 mmol/L (ref 22–32)

## 2020-05-27 LAB — GLUCOSE, CAPILLARY: Glucose-Capillary: 139 mg/dL — ABNORMAL HIGH (ref 70–99)

## 2020-05-27 SURGERY — CYSTOURETEROSCOPY, WITH RETROGRADE PYELOGRAM AND STENT INSERTION
Anesthesia: General | Site: Pelvis | Laterality: Right

## 2020-05-27 MED ORDER — FENTANYL CITRATE (PF) 100 MCG/2ML IJ SOLN
25.0000 ug | INTRAMUSCULAR | Status: DC | PRN
  Administered 2020-05-27: 50 ug via INTRAVENOUS

## 2020-05-27 MED ORDER — HYDROCODONE-ACETAMINOPHEN 5-325 MG PO TABS
ORAL_TABLET | ORAL | Status: AC
  Filled 2020-05-27: qty 1

## 2020-05-27 MED ORDER — HYDROCODONE-ACETAMINOPHEN 5-325 MG PO TABS
1.0000 | ORAL_TABLET | Freq: Once | ORAL | Status: AC
  Administered 2020-05-27: 1 via ORAL

## 2020-05-27 MED ORDER — MIDAZOLAM HCL 2 MG/2ML IJ SOLN
INTRAMUSCULAR | Status: AC
  Filled 2020-05-27: qty 2

## 2020-05-27 MED ORDER — DROPERIDOL 2.5 MG/ML IJ SOLN: 0.6250 mg | Freq: Once | INTRAMUSCULAR | Status: DC | PRN

## 2020-05-27 MED ORDER — TAMSULOSIN HCL 0.4 MG PO CAPS
0.4000 mg | ORAL_CAPSULE | Freq: Every day | ORAL | 0 refills | Status: DC
  Filled 2020-05-27: qty 5, 5d supply, fill #0

## 2020-05-27 MED ORDER — LIDOCAINE HCL (CARDIAC) PF 100 MG/5ML IV SOSY
PREFILLED_SYRINGE | INTRAVENOUS | Status: DC | PRN
  Administered 2020-05-27: 80 mg via INTRAVENOUS

## 2020-05-27 MED ORDER — OXYCODONE HCL 5 MG/5ML PO SOLN: 5.0000 mg | Freq: Once | ORAL | Status: DC | PRN

## 2020-05-27 MED ORDER — GLYCOPYRROLATE 0.2 MG/ML IJ SOLN
INTRAMUSCULAR | Status: DC | PRN
  Administered 2020-05-27: .1 mg via INTRAVENOUS

## 2020-05-27 MED ORDER — CEFAZOLIN SODIUM-DEXTROSE 2-4 GM/100ML-% IV SOLN
INTRAVENOUS | Status: AC
  Filled 2020-05-27: qty 100

## 2020-05-27 MED ORDER — PROPOFOL 10 MG/ML IV BOLUS
INTRAVENOUS | Status: AC
  Filled 2020-05-27: qty 40

## 2020-05-27 MED ORDER — MIDAZOLAM HCL 2 MG/2ML IJ SOLN
INTRAMUSCULAR | Status: DC | PRN
  Administered 2020-05-27: 1 mg via INTRAVENOUS

## 2020-05-27 MED ORDER — FENTANYL CITRATE (PF) 100 MCG/2ML IJ SOLN
INTRAMUSCULAR | Status: AC
  Filled 2020-05-27: qty 2

## 2020-05-27 MED ORDER — ONDANSETRON HCL 4 MG/2ML IJ SOLN
INTRAMUSCULAR | Status: DC | PRN
  Administered 2020-05-27: 4 mg via INTRAVENOUS

## 2020-05-27 MED ORDER — CIPROFLOXACIN HCL 500 MG PO TABS
500.0000 mg | ORAL_TABLET | Freq: Once | ORAL | 0 refills | Status: AC
  Filled 2020-05-27: qty 1, 1d supply, fill #0

## 2020-05-27 MED ORDER — IOHEXOL 300 MG/ML  SOLN
INTRAMUSCULAR | Status: DC | PRN
  Administered 2020-05-27: 10 mL via URETHRAL

## 2020-05-27 MED ORDER — PROPOFOL 10 MG/ML IV BOLUS
INTRAVENOUS | Status: DC | PRN
  Administered 2020-05-27: 150 mg via INTRAVENOUS

## 2020-05-27 MED ORDER — LACTATED RINGERS IV SOLN: INTRAVENOUS | Status: DC

## 2020-05-27 MED ORDER — MEPERIDINE HCL 25 MG/ML IJ SOLN: 6.2500 mg | INTRAMUSCULAR | Status: DC | PRN

## 2020-05-27 MED ORDER — CEFAZOLIN SODIUM-DEXTROSE 2-4 GM/100ML-% IV SOLN
2.0000 g | INTRAVENOUS | Status: AC
  Administered 2020-05-27: 2 g via INTRAVENOUS

## 2020-05-27 MED ORDER — FENTANYL CITRATE (PF) 100 MCG/2ML IJ SOLN
INTRAMUSCULAR | Status: DC | PRN
  Administered 2020-05-27: 50 ug via INTRAVENOUS
  Administered 2020-05-27: 25 ug via INTRAVENOUS

## 2020-05-27 MED ORDER — SODIUM CHLORIDE 0.9 % IR SOLN
Status: DC | PRN
  Administered 2020-05-27: 3000 mL

## 2020-05-27 MED ORDER — HYDROCODONE-ACETAMINOPHEN 5-325 MG PO TABS
1.0000 | ORAL_TABLET | ORAL | 0 refills | Status: DC | PRN
  Filled 2020-05-27: qty 15, 3d supply, fill #0

## 2020-05-27 MED ORDER — OXYCODONE HCL 5 MG PO TABS: 5.0000 mg | ORAL_TABLET | Freq: Once | ORAL | Status: DC | PRN

## 2020-05-27 SURGICAL SUPPLY — 23 items
BAG DRAIN URO-CYSTO SKYTR STRL (DRAIN) ×2 IMPLANT
BAG DRN UROCATH (DRAIN) ×1
BASKET ZERO TIP NITINOL 2.4FR (BASKET) IMPLANT
BSKT STON RTRVL ZERO TP 2.4FR (BASKET)
CATH URET 5FR 28IN OPEN ENDED (CATHETERS) ×2 IMPLANT
CLOTH BEACON ORANGE TIMEOUT ST (SAFETY) ×2 IMPLANT
COVER DOME SNAP 22 D (MISCELLANEOUS) ×1 IMPLANT
DRSG TEGADERM 4X4.75 (GAUZE/BANDAGES/DRESSINGS) IMPLANT
EXTRACTOR STONE 1.7FRX115CM (UROLOGICAL SUPPLIES) IMPLANT
GLOVE SURG ENC MOIS LTX SZ6.5 (GLOVE) ×2 IMPLANT
GOWN STRL REUS W/TWL LRG LVL3 (GOWN DISPOSABLE) ×2 IMPLANT
GUIDEWIRE STR DUAL SENSOR (WIRE) ×3 IMPLANT
INFUSOR MANOMETER BAG 3000ML (MISCELLANEOUS) ×1 IMPLANT
IV NS IRRIG 3000ML ARTHROMATIC (IV SOLUTION) ×3 IMPLANT
KIT TURNOVER CYSTO (KITS) ×2 IMPLANT
MANIFOLD NEPTUNE II (INSTRUMENTS) ×2 IMPLANT
PACK CYSTO (CUSTOM PROCEDURE TRAY) ×2 IMPLANT
SHEATH URETERAL 12FRX28CM (UROLOGICAL SUPPLIES) ×1 IMPLANT
STENT URET 6FRX24 CONTOUR (STENTS) ×1 IMPLANT
TRACTIP FLEXIVA PULS ID 200XHI (Laser) IMPLANT
TRACTIP FLEXIVA PULSE ID 200 (Laser) ×2
TUBE CONNECTING 12X1/4 (SUCTIONS) ×2 IMPLANT
TUBING UROLOGY SET (TUBING) ×1 IMPLANT

## 2020-05-27 NOTE — Op Note (Signed)
Preoperative diagnosis: right renal calculus  Postoperative diagnosis: right renal calculus  Procedure:  1. Cystoscopy 2. Right ureteroscopy and laser lithotripsy 3. right 57F x 24cm ureteral stent placement - with tether 4. right retrograde pyelography with interpretation  Surgeon: Jacalyn Lefevre, MD  Anesthesia: General  Complications: None  Intraoperative findings:  1.  Normal urethra 2.  Bilateral orthotopic ureteral orifices 3.  Normal bladder mucosa 4.  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  6 French by 24 cm stent with tether  EBL: Minimal  Specimens: 1. none  Disposition of specimens: [facility] for stone analysis  Indication: Alexis [name] is a 65 y.o.   patient with a 6 mm non-obstructing renal calculus with  right ureteral stone and associated right symptoms. After reviewing the management options for treatment, the patient elected to proceed with the above surgical procedure(s). We have discussed the potential benefits and risks of the procedure, side effects of the proposed treatment, the likelihood of the patient achieving the goals of the procedure, and any potential problems that might occur during the procedure or recuperation. Informed consent has been obtained.   Description of procedure:  The patient was taken to the operating room and general anesthesia was induced.  The patient was placed in the dorsal lithotomy position, prepped and draped in the usual sterile fashion, and preoperative antibiotics were administered. A preoperative time-out was performed.   Cystourethroscopy was performed.  The patient's urethra was examined and was normal. The bladder was then systematically examined in its entirety. There was no evidence for any bladder tumors, stones, or other mucosal pathology.    Attention then turned to the right  ureteral orifice and a 0.38 sensor guidewire was then advanced up the right ureter into the renal pelvis under fluoroscopic guidance. The 6 Fr semirigid ureteroscope was then advanced into the ureter to proximal ureter. No stone was seen.  Next, a second wire was placed through the ureteroscope and the ureteroscope was removed.  Attempts at placing a ureteral access sheath over the second wire were met with resistance in the distal ureter.  A flexible ureteroscope was then placed over one of the wires and advanced to the kidney under direct visualization.  At that point in time a partial duplication was noted.  The stone was seen in the mid calyx.   The stone was then dusted with the 200 micron holmium laser fiber on a setting of 0.5 and frequency of 53 Hz.  All remaining stone fragments were less than 1 mm in size.  Basketing cannot take place due to lack of sheath in place.  Reinspection of the ureter revealed no remaining visible stones or fragments.   The ureteral stent stent was was then positioned appropriately under fluoroscopic guidance.  The wire was then removed with an adequate stent curl noted in the renal pelvis as well as in the bladder.  The bladder was then emptied and the procedure ended.  The patient appeared to tolerate the procedure well and without complications.  The patient was able to be awakened and transferred to the recovery unit in satisfactory condition.   Disposition: The tether of the stent was left on and tucked inside the patient's vagina.  Instructions for removing the stent have been provided to the patient.

## 2020-05-27 NOTE — Anesthesia Procedure Notes (Signed)
Procedure Name: LMA Insertion Date/Time: 05/27/2020 7:40 AM Performed by: Georgeanne Nim, CRNA Pre-anesthesia Checklist: Patient identified, Emergency Drugs available, Suction available, Patient being monitored and Timeout performed Patient Re-evaluated:Patient Re-evaluated prior to induction Oxygen Delivery Method: Circle system utilized Preoxygenation: Pre-oxygenation with 100% oxygen Induction Type: IV induction LMA: LMA inserted LMA Size: 4.0 Number of attempts: 1 Placement Confirmation: positive ETCO2,  CO2 detector and breath sounds checked- equal and bilateral Tube secured with: Tape Dental Injury: Teeth and Oropharynx as per pre-operative assessment

## 2020-05-27 NOTE — Anesthesia Postprocedure Evaluation (Signed)
Anesthesia Post Note  Patient: Alexis [name]  Procedure(s) Performed: CYSTOSCOPY WITH RETROGRADE PYELOGRAM, URETEROSCOPY,  STENT PLACEMENT (Right Pelvis) HOLMIUM LASER APPLICATION (Right Pelvis)     Patient location during evaluation: PACU Anesthesia Type: General Level of consciousness: sedated and patient cooperative Pain management: pain level controlled Vital Signs Assessment: post-procedure vital signs reviewed and stable Respiratory status: spontaneous breathing Cardiovascular status: stable Anesthetic complications: no   No complications documented.  Last Vitals:  Vitals:   05/27/20 0845 05/27/20 0930  BP: 139/61 (!) 149/59  Pulse: 70 63  Resp: 17 14  Temp: 36.6 C 36.7 C  SpO2: 95% 96%    Last Pain:  Vitals:   05/27/20 0930  TempSrc:   PainSc: [place]

## 2020-05-27 NOTE — Interval H&P Note (Signed)
History and Physical Interval Note:  05/27/2020 7:18 AM  Alexis [name]  has presented today for surgery, with the diagnosis of RIGHT RENAL CALCULUS.  The various methods of treatment have been discussed with the patient and family. After consideration of risks, benefits and other options for treatment, the patient has consented to  Procedure(s): CYSTOSCOPY WITH RETROGRADE PYELOGRAM, URETEROSCOPY AND STENT PLACEMENT (Right) HOLMIUM LASER APPLICATION (Right) as a surgical intervention.  The patient's history has been reviewed, patient examined, no change in status, stable for surgery.  I have reviewed the patient's chart and labs.  Questions were answered to the patient's satisfaction.     Morgyn Marut D Davyon Fisch

## 2020-05-27 NOTE — Transfer of Care (Signed)
Immediate Anesthesia Transfer of Care Note  Patient: Alexis [name]  Procedure(s) Performed: CYSTOSCOPY WITH RETROGRADE PYELOGRAM, URETEROSCOPY,  STENT PLACEMENT (Right Pelvis) HOLMIUM LASER APPLICATION (Right Pelvis)  Patient Location: PACU  Anesthesia Type:General  Level of Consciousness: awake, alert , oriented and patient cooperative  Airway & Oxygen Therapy: Patient Spontanous Breathing and Patient connected to face mask oxygen  Post-op Assessment: Report given to RN and Post -op Vital signs reviewed and stable  Post vital signs: Reviewed and stable  Last Vitals:  Vitals Value Taken Time  BP    Temp    Pulse    Resp 18 05/27/20 0829  SpO2    Vitals shown include unvalidated device data.  Last Pain:  Vitals:   05/27/20 0602  TempSrc: Oral  PainSc: 0-No pain      Patients Stated Pain Goal: 5 (91/79/15 0569)  Complications: No complications documented.

## 2020-05-27 NOTE — Discharge Instructions (Signed)
DISCHARGE INSTRUCTIONS FOR KIDNEY STONE/URETERAL STENT   MEDICATIONS:  1. Resume all your other meds from home  2. AZO over the counter can help with the burning/stinging when you urinate. 3. Hydrocodone-acetaminophen is for moderate/severe pain 4. Take Cipro one hour prior to removal of your stent on 5/20   ACTIVITY:  1. No strenuous activity x 1week  2. No driving while on narcotic pain medications  3. Drink plenty of water  4. Continue to walk at home - you can still get blood clots when you are at home, so keep active, but don't over do it.  5. May return to work/school tomorrow or when you feel ready   BATHING:  1. You can shower and we recommend daily showers  2. You have a string coming from your urethra: The stent string is attached to your ureteral stent. Do not pull on this.   SIGNS/SYMPTOMS TO CALL:  Please call us if you have a fever greater than 101.5, uncontrolled nausea/vomiting, uncontrolled pain, dizziness, unable to urinate, bloody urine, chest pain, shortness of breath, leg swelling, leg pain, redness around wound, drainage from wound, or any other concerns or questions.   You can reach [place] at [phone].   FOLLOW-UP:  1. You have a string attached to your stent, you may remove it on Friday, May 20. To do this, pull the strings until the stent is completely removed. You may feel an odd sensation in your back.  Take your antibiotic 1 hour prior to stent removal.   Post Anesthesia Home Care Instructions  Activity: Get plenty of rest for the remainder of the day. A responsible individual must stay with you for 24 hours following the procedure.  For the next 24 hours, DO NOT: -Drive a car -Paediatric nurse -Drink alcoholic beverages -Take any medication unless instructed by your physician -Make any legal decisions or sign important papers.  Meals: Start with liquid foods such as gelatin or soup. Progress to regular foods as tolerated. Avoid greasy, spicy,  heavy foods. If nausea and/or vomiting occur, drink only clear liquids until the nausea and/or vomiting subsides. Call your physician if vomiting continues.  Special Instructions/Symptoms: Your throat may feel dry or sore from the anesthesia or the breathing tube placed in your throat during surgery. If this causes discomfort, gargle with warm salt water. The discomfort should disappear within 24 hours.

## 2020-05-28 ENCOUNTER — Encounter (HOSPITAL_BASED_OUTPATIENT_CLINIC_OR_DEPARTMENT_OTHER): Payer: Self-pay | Admitting: Urology

## 2020-05-28 NOTE — Addendum Note (Signed)
Addendum  created 05/28/20 0951 by Mechele Claude, CRNA   Charge Capture section accepted

## 2020-05-30 ENCOUNTER — Other Ambulatory Visit (HOSPITAL_COMMUNITY): Payer: Self-pay

## 2020-05-30 MED ORDER — KETOROLAC TROMETHAMINE 10 MG PO TABS
ORAL_TABLET | ORAL | 0 refills | Status: DC
  Filled 2020-05-30: qty 15, 5d supply, fill #0

## 2020-05-30 MED ORDER — HYDROCODONE-ACETAMINOPHEN 5-325 MG PO TABS
ORAL_TABLET | ORAL | 0 refills | Status: DC
  Filled 2020-05-30: qty 10, 3d supply, fill #0

## 2020-05-30 NOTE — Addendum Note (Signed)
Addendum  created 05/30/20 1048 by Georgeanne Nim, CRNA   Charge Capture section accepted

## 2020-06-03 ENCOUNTER — Other Ambulatory Visit (HOSPITAL_COMMUNITY): Payer: Self-pay

## 2020-06-04 ENCOUNTER — Other Ambulatory Visit (HOSPITAL_COMMUNITY): Payer: Self-pay

## 2020-06-24 DIAGNOSIS — N200 Calculus of kidney: Secondary | ICD-10-CM | POA: Diagnosis not present

## 2020-06-25 ENCOUNTER — Other Ambulatory Visit (HOSPITAL_COMMUNITY): Payer: Self-pay

## 2020-06-25 ENCOUNTER — Other Ambulatory Visit: Payer: Self-pay | Admitting: Hematology and Oncology

## 2020-06-25 MED ORDER — HUMULIN R U-500 KWIKPEN 500 UNIT/ML ~~LOC~~ SOPN
PEN_INJECTOR | SUBCUTANEOUS | 3 refills | Status: DC
  Filled 2020-06-25: qty 48, 88d supply, fill #0
  Filled 2020-06-27 – 2020-10-22 (×2): qty 42, 77d supply, fill #0
  Filled 2021-03-24: qty 42, 77d supply, fill #1

## 2020-06-25 MED ORDER — LETROZOLE 2.5 MG PO TABS
ORAL_TABLET | Freq: Every day | ORAL | 0 refills | Status: DC
  Filled 2020-06-25: qty 90, 90d supply, fill #0

## 2020-06-25 MED FILL — Omeprazole Cap Delayed Release 20 MG: ORAL | 90 days supply | Qty: 90 | Fill #0 | Status: AC

## 2020-06-26 ENCOUNTER — Other Ambulatory Visit (HOSPITAL_COMMUNITY): Payer: Self-pay

## 2020-06-27 ENCOUNTER — Other Ambulatory Visit (HOSPITAL_COMMUNITY): Payer: Self-pay

## 2020-06-30 ENCOUNTER — Other Ambulatory Visit (HOSPITAL_COMMUNITY): Payer: Self-pay

## 2020-06-30 DIAGNOSIS — R809 Proteinuria, unspecified: Secondary | ICD-10-CM | POA: Diagnosis not present

## 2020-07-01 ENCOUNTER — Other Ambulatory Visit (HOSPITAL_COMMUNITY): Payer: Self-pay

## 2020-07-21 ENCOUNTER — Other Ambulatory Visit (HOSPITAL_COMMUNITY): Payer: Self-pay

## 2020-07-31 ENCOUNTER — Other Ambulatory Visit: Payer: Self-pay | Admitting: Hematology and Oncology

## 2020-07-31 DIAGNOSIS — Z9889 Other specified postprocedural states: Secondary | ICD-10-CM

## 2020-08-11 ENCOUNTER — Other Ambulatory Visit: Payer: Self-pay

## 2020-08-11 ENCOUNTER — Ambulatory Visit: Payer: 59 | Admitting: Podiatry

## 2020-08-11 ENCOUNTER — Other Ambulatory Visit (HOSPITAL_COMMUNITY): Payer: Self-pay

## 2020-08-11 ENCOUNTER — Ambulatory Visit: Payer: 59

## 2020-08-11 ENCOUNTER — Ambulatory Visit (INDEPENDENT_AMBULATORY_CARE_PROVIDER_SITE_OTHER): Payer: 59

## 2020-08-11 ENCOUNTER — Encounter: Payer: Self-pay | Admitting: Podiatry

## 2020-08-11 DIAGNOSIS — K5732 Diverticulitis of large intestine without perforation or abscess without bleeding: Secondary | ICD-10-CM | POA: Insufficient documentation

## 2020-08-11 DIAGNOSIS — L719 Rosacea, unspecified: Secondary | ICD-10-CM | POA: Insufficient documentation

## 2020-08-11 DIAGNOSIS — Z794 Long term (current) use of insulin: Secondary | ICD-10-CM | POA: Insufficient documentation

## 2020-08-11 DIAGNOSIS — E559 Vitamin D deficiency, unspecified: Secondary | ICD-10-CM | POA: Insufficient documentation

## 2020-08-11 DIAGNOSIS — G8929 Other chronic pain: Secondary | ICD-10-CM | POA: Diagnosis not present

## 2020-08-11 DIAGNOSIS — M722 Plantar fascial fibromatosis: Secondary | ICD-10-CM

## 2020-08-11 DIAGNOSIS — M5136 Other intervertebral disc degeneration, lumbar region: Secondary | ICD-10-CM | POA: Insufficient documentation

## 2020-08-11 DIAGNOSIS — K219 Gastro-esophageal reflux disease without esophagitis: Secondary | ICD-10-CM | POA: Insufficient documentation

## 2020-08-11 DIAGNOSIS — R300 Dysuria: Secondary | ICD-10-CM | POA: Insufficient documentation

## 2020-08-11 DIAGNOSIS — M79671 Pain in right foot: Secondary | ICD-10-CM

## 2020-08-11 DIAGNOSIS — C50919 Malignant neoplasm of unspecified site of unspecified female breast: Secondary | ICD-10-CM | POA: Insufficient documentation

## 2020-08-11 DIAGNOSIS — E6609 Other obesity due to excess calories: Secondary | ICD-10-CM | POA: Insufficient documentation

## 2020-08-11 DIAGNOSIS — L42 Pityriasis rosea: Secondary | ICD-10-CM | POA: Insufficient documentation

## 2020-08-11 DIAGNOSIS — G894 Chronic pain syndrome: Secondary | ICD-10-CM | POA: Insufficient documentation

## 2020-08-11 DIAGNOSIS — N200 Calculus of kidney: Secondary | ICD-10-CM | POA: Insufficient documentation

## 2020-08-11 DIAGNOSIS — E538 Deficiency of other specified B group vitamins: Secondary | ICD-10-CM | POA: Insufficient documentation

## 2020-08-11 DIAGNOSIS — J301 Allergic rhinitis due to pollen: Secondary | ICD-10-CM | POA: Insufficient documentation

## 2020-08-11 DIAGNOSIS — E782 Mixed hyperlipidemia: Secondary | ICD-10-CM | POA: Insufficient documentation

## 2020-08-11 DIAGNOSIS — M459 Ankylosing spondylitis of unspecified sites in spine: Secondary | ICD-10-CM | POA: Insufficient documentation

## 2020-08-11 DIAGNOSIS — E039 Hypothyroidism, unspecified: Secondary | ICD-10-CM | POA: Insufficient documentation

## 2020-08-11 MED ORDER — TRIAMCINOLONE ACETONIDE 10 MG/ML IJ SUSP
10.0000 mg | Freq: Once | INTRAMUSCULAR | Status: AC
  Administered 2020-08-11: 10 mg

## 2020-08-11 NOTE — Patient Instructions (Addendum)

## 2020-08-14 NOTE — Progress Notes (Signed)
Subjective: 65 year old female presents the office today for concerns of recurrent pain to the right heel.  She states it started to act up again.  She says it hurts when she is a lot of walking.  She is in general ice on the area.  She said it feels exact same as it did in 2019.  No recent injury or falls or any changes otherwise.  Has been try to wear more supportive sneakers as well.Denies any systemic complaints such as fevers, chills, nausea, vomiting. No acute changes since last appointment, and no other complaints at this time.   Objective: AAO x3, NAD DP/PT pulses palpable bilaterally, CRT less than 3 seconds Tenderness to palpation along the plantar medial tubercle of the calcaneus at the insertion of plantar fascia on the right foot. There is mild discomfort along the course of the plantar fascia within the arch of the foot. Plantar fascia appears to be intact. There is no pain with lateral compression of the calcaneus or pain with vibratory sensation. There is no pain along the course or insertion of the achilles tendon. No other areas of tenderness to bilateral lower extremities. No open lesions or pre-ulcerative lesions.  No pain with calf compression, swelling, warmth, erythema  Assessment: Right heel pain, plantar fasciitis  Plan: -All treatment options discussed with the patient including all alternatives, risks, complications.  -X-rays obtained and reviewed.  No evidence of acute fracture or stress fracture. -Steroid injection performed.  See procedure note below. -Continue stretching, icing daily.  Discussed shoe modifications and arch support. -Patient encouraged to call the office with any questions, concerns, change in symptoms.   Procedure: Injection Tendon/Ligament Discussed alternatives, risks, complications and verbal consent was obtained.  Location: Right plantar fascia at the glabrous junction; medial approach. Skin Prep: Alcohol. Injectate: 0.5cc 0.5% marcaine  plain, 0.5 cc 2% lidocaine plain and, 1 cc kenalog 10. Disposition: Patient tolerated procedure well. Injection site dressed with a band-aid.  Post-injection care was discussed and return precautions discussed.   Return in about 6 weeks (around 09/22/2020), or if symptoms worsen or fail to improve.  Trula Slade DPM

## 2020-08-28 ENCOUNTER — Other Ambulatory Visit (HOSPITAL_COMMUNITY): Payer: Self-pay

## 2020-09-05 DIAGNOSIS — E1165 Type 2 diabetes mellitus with hyperglycemia: Secondary | ICD-10-CM | POA: Diagnosis not present

## 2020-09-05 DIAGNOSIS — Z794 Long term (current) use of insulin: Secondary | ICD-10-CM | POA: Diagnosis not present

## 2020-09-05 DIAGNOSIS — E042 Nontoxic multinodular goiter: Secondary | ICD-10-CM | POA: Diagnosis not present

## 2020-09-05 DIAGNOSIS — E785 Hyperlipidemia, unspecified: Secondary | ICD-10-CM | POA: Diagnosis not present

## 2020-09-05 DIAGNOSIS — E119 Type 2 diabetes mellitus without complications: Secondary | ICD-10-CM | POA: Diagnosis not present

## 2020-09-05 DIAGNOSIS — Z8639 Personal history of other endocrine, nutritional and metabolic disease: Secondary | ICD-10-CM | POA: Diagnosis not present

## 2020-09-11 ENCOUNTER — Ambulatory Visit: Payer: 59 | Admitting: Hematology and Oncology

## 2020-09-30 ENCOUNTER — Other Ambulatory Visit: Payer: Self-pay | Admitting: Hematology and Oncology

## 2020-09-30 DIAGNOSIS — Z853 Personal history of malignant neoplasm of breast: Secondary | ICD-10-CM

## 2020-10-03 ENCOUNTER — Ambulatory Visit (INDEPENDENT_AMBULATORY_CARE_PROVIDER_SITE_OTHER): Payer: 59 | Admitting: Obstetrics & Gynecology

## 2020-10-03 ENCOUNTER — Encounter: Payer: 59 | Admitting: Obstetrics and Gynecology

## 2020-10-03 ENCOUNTER — Encounter: Payer: Self-pay | Admitting: Obstetrics & Gynecology

## 2020-10-03 ENCOUNTER — Other Ambulatory Visit (HOSPITAL_COMMUNITY)
Admission: RE | Admit: 2020-10-03 | Discharge: 2020-10-03 | Disposition: A | Discharge status: Home / Self Care | Payer: 59 | Source: Ambulatory Visit | Attending: Obstetrics and Gynecology | Admitting: Obstetrics and Gynecology

## 2020-10-03 ENCOUNTER — Other Ambulatory Visit: Payer: Self-pay

## 2020-10-03 ENCOUNTER — Ambulatory Visit
Admission: RE | Admit: 2020-10-03 | Discharge: 2020-10-03 | Disposition: A | Discharge status: Home / Self Care | Payer: 59 | Source: Ambulatory Visit | Attending: Hematology and Oncology | Admitting: Hematology and Oncology

## 2020-10-03 VITALS — BP 122/70 | HR 81 | Resp 16 | Ht 62.75 in | Wt 231.0 lb

## 2020-10-03 DIAGNOSIS — Z780 Asymptomatic menopausal state: Secondary | ICD-10-CM | POA: Diagnosis not present

## 2020-10-03 DIAGNOSIS — Z90710 Acquired absence of both cervix and uterus: Secondary | ICD-10-CM | POA: Diagnosis not present

## 2020-10-03 DIAGNOSIS — N890 Mild vaginal dysplasia: Secondary | ICD-10-CM | POA: Diagnosis not present

## 2020-10-03 DIAGNOSIS — Z6841 Body Mass Index (BMI) 40.0 and over, adult: Secondary | ICD-10-CM | POA: Diagnosis not present

## 2020-10-03 DIAGNOSIS — N898 Other specified noninflammatory disorders of vagina: Secondary | ICD-10-CM | POA: Insufficient documentation

## 2020-10-03 DIAGNOSIS — Z01419 Encounter for gynecological examination (general) (routine) without abnormal findings: Secondary | ICD-10-CM | POA: Insufficient documentation

## 2020-10-03 DIAGNOSIS — Z853 Personal history of malignant neoplasm of breast: Secondary | ICD-10-CM

## 2020-10-03 DIAGNOSIS — R922 Inconclusive mammogram: Secondary | ICD-10-CM | POA: Diagnosis not present

## 2020-10-03 NOTE — Progress Notes (Signed)
Alexis [name] Dec 08, 1955 [id number]   History:    65 y.o. G3P2A1  RP:  Established patient presenting for annual gyn exam   HPI: Postmenopausal. Prior LAVH in 2004 for leiomyoma.  No vaginal bleeding.  Will monitor for any worsening of hot flashes or night sweats.  10/2019 Colpo of vagina Negative.  History of VAIN1.   Left breast cancer diagnosis 2019.  Status post lumpectomy and radiation.  Mammogram done today.  On Bumex.  She will continue to follow-up with Dr. Lindi Adie. Colonoscopy 2019 per patient.  DEXA 2018 normal.  Health labs with Fam MD.  BMI 41.25.  Past medical history,surgical history, family history and social history were all reviewed and documented in the EPIC chart.  Gynecologic History No LMP recorded. Patient has had a hysterectomy.  Obstetric History OB History  Gravida Para Term Preterm AB Living  3 2 2   1 2   SAB IAB Ectopic Multiple Live Births  1            # Outcome Date GA Lbr Len/2nd Weight Sex Delivery Anes PTL Lv  3 SAB           2 Term           1 Term              ROS: A ROS was performed and pertinent positives and negatives are included in the history.  GENERAL: No fevers or chills. HEENT: No change in vision, no earache, sore throat or sinus congestion. NECK: No pain or stiffness. CARDIOVASCULAR: No chest pain or pressure. No palpitations. PULMONARY: No shortness of breath, cough or wheeze. GASTROINTESTINAL: No abdominal pain, nausea, vomiting or diarrhea, melena or bright red blood per rectum. GENITOURINARY: No urinary frequency, urgency, hesitancy or dysuria. MUSCULOSKELETAL: No joint or muscle pain, no back pain, no recent trauma. DERMATOLOGIC: No rash, no itching, no lesions. ENDOCRINE: No polyuria, polydipsia, no heat or cold intolerance. No recent change in weight. HEMATOLOGICAL: No anemia or easy bruising or bleeding. NEUROLOGIC: No headache, seizures, numbness, tingling or weakness. PSYCHIATRIC: No depression, no loss of interest in  normal activity or change in sleep pattern.     Exam:   BP 122/70   Pulse 81   Resp 16   Ht 5' 2.75" (1.594 m)   Wt 231 lb (104.8 kg)   BMI 41.25 kg/m   Body mass index is 41.25 kg/m.  General appearance : Well developed well nourished female. No acute distress HEENT: Eyes: no retinal hemorrhage or exudates,  Neck supple, trachea midline, no carotid bruits, no thyroidmegaly Lungs: Clear to auscultation, no rhonchi or wheezes, or rib retractions  Heart: Regular rate and rhythm, no murmurs or gallops Breast:Examined in sitting and supine position were symmetrical in appearance, no palpable masses or tenderness,  no skin retraction, no nipple inversion, no nipple discharge, no skin discoloration, no axillary or supraclavicular lymphadenopathy Abdomen: no palpable masses or tenderness, no rebound or guarding Extremities: no edema or skin discoloration or tenderness  Pelvic: Vulva: Normal             Vagina: No gross lesions or discharge.  Pap reflex done.  Cervix/Uterus absent  Adnexa  Without masses or tenderness  Anus: Normal   Assessment/Plan:  65 y.o. female for annual exam   1. Encounter for routine gynecological examination with Papanicolaou smear of cervix Gynecologic exam status post LAVH.  Pap reflex done.  History of VIN 1.  Breast exam normal.  Screening mammogram  today.  Colonoscopy 2016.  Health labs with family NP. - Cytology - PAP( [place])  2. S/P laparoscopic assisted vaginal hysterectomy (LAVH)  3. VAIN I (vaginal intraepithelial neoplasia grade I) - Cytology - PAP( [place])  4. Postmenopause Postmenopausal, well on no hormone replacement therapy.  Bone density normal in 2018, repeat at 5 years in 2023.  5. History of left breast cancer On Bumex.  Followed by Dr. Lindi Adie.  6. Class 3 severe obesity due to excess calories with serious comorbidity and body mass index (BMI) of 40.0 to 44.9 in adult [facility])  Recommend a lower calorie/carb diet.   Aerobic activities 5 times a week and light weightlifting every 2 days.  Princess Bruins MD, 2:12 PM 10/03/2020

## 2020-10-06 LAB — CYTOLOGY - PAP: Diagnosis: NEGATIVE

## 2020-10-08 ENCOUNTER — Encounter: Payer: Self-pay | Admitting: Obstetrics & Gynecology

## 2020-10-08 NOTE — Progress Notes (Signed)
Patient Care Team: Adaline Sill, NP as PCP - General (Internal Medicine) [name], MD as Consulting Physician (General Surgery) Nicholas Lose, MD as Consulting Physician (Hematology and Oncology) Gery Pray, MD as Consulting Physician (Radiation Oncology) Warren Danes, PA-C as Physician Assistant (Dermatology)  DIAGNOSIS:    ICD-10-CM   1. Carcinoma of upper-outer quadrant of left breast in female, estrogen receptor positive ([place])  C50.412    Z17.0       SUMMARY OF ONCOLOGIC HISTORY: Oncology History  Carcinoma of upper-outer quadrant of left breast in female, estrogen receptor positive ([place])  10/09/2017 Initial Diagnosis   Carcinoma of upper-outer quadrant of left breast in female, estrogen receptor positive ([place])   10/20/2017 Surgery   Left lumpectomy: IDC grade 2, 2.1 cm, margins Negative 0.1 cm to the anterior margin, UDH, 0/3 lymph nodes negative, ER 95%, PR 80%, HER-2 negative, Ki-67 10%, T2N0 stage IA   10/25/2017 Cancer Staging   Staging form: Breast, AJCC 8th Edition - Pathologic: Stage IA (pT2, pN0(sn), cM0, G2, ER+, PR+, HER2-) - Signed by Nicholas Lose, MD on 10/25/2017   11/02/2017 Oncotype testing   Oncotype DX recurrence score 23, distant recurrence at 9 years 9%, no chemo benefit   11/29/2017 - 01/13/2018 Radiation Therapy   Adjuvant radiation therapy   01/2018 -  Anti-estrogen oral therapy   Anastrozole daily, switched to letrozole 05/18/19     CHIEF COMPLIANT: Follow-up of left breast cancer on letrozole  INTERVAL HISTORY: Alexis [name] is a 65 y.o. with above-mentioned history of  left breast cancer treated with lumpectomy, radiation, and who is currently on antiestrogen therapy with letrozole. Mammogram on 10/03/2020 showed no evidence of malignancy. She presents to the clinic today for follow-up.   ALLERGIES:  is allergic to canagliflozin, empagliflozin, metformin hcl, and parlodel [bromocriptine mesylate].  MEDICATIONS:   Current Outpatient Medications  Medication Sig Dispense Refill   ACCU-CHEK FASTCLIX LANCETS MISC USE 3 TIMES A DAY AS DIRECTED  3   ACCU-CHEK GUIDE test strip USE 3 TIMES A DAY AS DIRECTED  3   aspirin 81 MG EC tablet 1 tablet     bumetanide (BUMEX) 2 MG tablet See admin instructions.     calcium carbonate (OSCAL) 1500 (600 Ca) MG TABS tablet 1 tablet with meals     cetirizine (ZYRTEC) 10 MG tablet Take 10 mg by mouth daily.     Cholecalciferol (VITAMIN D-3 PO) Take by mouth daily.     Continuous Blood Gluc Sensor (DEXCOM G6 SENSOR) MISC      Dulaglutide (TRULICITY) 1.5 QQ/5.9DG SOPN Inject 1.5 mg into the skin once a week 90 days 6 mL 4   furosemide (LASIX) 20 MG tablet Take 20 mg by mouth as needed.     insulin regular human CONCENTRATED (HUMULIN R U-500 KWIKPEN) 500 UNIT/ML kwikpen INJECT 100 UNITS BEFORE BREAKFAST, 70 UNITS BEFORE LUNCH, 100 UNITS BEFORE EVENING MEALS THIRTY MINUTES BEFORE MEALS UNDER THE SKIN 48 mL 3   letrozole (FEMARA) 2.5 MG tablet Take 2.5 mg by mouth daily.     omeprazole (PRILOSEC) 20 MG capsule TAKE 1 CAPSULE BY MOUTH EVERY MORNING BEFORE BREAKFAST (Patient taking differently: Take 20 mg by mouth daily as needed.) 90 capsule 1   UNIFINE PENTIPS 31G X 5 MM MISC USE TWICE A DAY AS DIRECTED  2   No current facility-administered medications for this visit.    PHYSICAL EXAMINATION: ECOG PERFORMANCE STATUS: 1 - Symptomatic but completely ambulatory  Vitals:   10/10/20 0820  BP: (!) 143/62  Pulse: 67  Resp: 18  Temp: (!) 97.4 F (36.3 C)  SpO2: 97%   Filed Weights   10/10/20 0820  Weight: 233 lb 3.2 oz (105.8 kg)    BREAST: No palpable masses or nodules in either right or left breasts. No palpable axillary supraclavicular or infraclavicular adenopathy no breast tenderness or nipple discharge. (exam performed in the presence of a chaperone)  LABORATORY DATA:  I have reviewed the data as listed CMP Latest Ref Rng & Units 05/27/2020 02/06/2020 09/12/2019   Glucose 70 - 99 mg/dL 145(H) 72 190(H)  BUN 8 - 23 mg/dL $Remove'10 16 15  'hmKfMOC$ Creatinine 0.44 - 1.00 mg/dL 0.60 0.72 0.87  Sodium 135 - 145 mmol/L 140 135 139  Potassium 3.5 - 5.1 mmol/L 4.0 3.7 4.2  Chloride 98 - 111 mmol/L 104 102 101  CO2 22 - 32 mmol/L - 24 26  Calcium 8.9 - 10.3 mg/dL - 9.4 10.2  Total Protein 6.5 - 8.1 g/dL - 7.3 7.2  Total Bilirubin 0.3 - 1.2 mg/dL - 0.4 0.4  Alkaline Phos 38 - 126 U/L - 96 90  AST 15 - 41 U/L - 17 10(L)  ALT 0 - 44 U/L - 20 12    Lab Results  Component Value Date   WBC 8.1 02/06/2020   HGB 13.6 05/27/2020   HCT 40.0 05/27/2020   MCV 85.7 02/06/2020   PLT 212 02/06/2020   NEUTROABS 4.0 09/12/2019    ASSESSMENT & PLAN:  Carcinoma of upper-outer quadrant of left breast in female, estrogen receptor positive ([place]) 10/11/2017:Left lumpectomy: IDC grade 2, 2.1 cm, margins Negative 0.1 cm to the anterior margin, UDH, 0/3 lymph nodes negative, ER 95%, PR 80%, HER-2 negative, Ki-67 10%, T2N0 stage IA Oncotype DX recurrence score 23: Risk of distant recurrence at 9 years with hormone therapy alone 9% no chemo benefit Adjuvant radiation therapy 11/29/2017 to 01/13/2018   Treatment plan: Adjuvant antiestrogen therapy with anastrozole 1 mg daily  started 01/30/2018 switched to letrozole 05/18/2019 Severe fluid retention currently on Bumex 2 mg once a day.  When the fluid retention is not severe she takes Lasix.  Sometimes she does not need to take any diuretic.   Breast cancer surveillance: 1.  Breast MRI 04/25/2019: No evidence of malignancy. 2.  Breast exam 10/10/20: Benign 3.  Mammogram 10/03/20: benign breast density category C.   I encouraged her to exercise regularly more often. 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  Return to clinic in 1 year for follow-up    No orders of the defined types were placed in this encounter.  The patient has a good understanding of the overall plan. she agrees with it.  she will call with any problems that may develop before the next visit here.  Total time spent: 20 mins including face to face time and time spent for planning, charting and coordination of care  Rulon Eisenmenger, MD, MPH 10/10/2020  I, Thana Ates, am acting as scribe for Dr. Nicholas Lose.  I have reviewed the above documentation for accuracy and completeness, and I agree with the above.

## 2020-10-10 ENCOUNTER — Other Ambulatory Visit: Payer: Self-pay

## 2020-10-10 ENCOUNTER — Inpatient Hospital Stay: Payer: 59 | Attending: Hematology and Oncology | Admitting: Hematology and Oncology

## 2020-10-10 DIAGNOSIS — C50412 Malignant neoplasm of upper-outer quadrant of left female breast: Secondary | ICD-10-CM

## 2020-10-10 DIAGNOSIS — Z79811 Long term (current) use of aromatase inhibitors: Secondary | ICD-10-CM | POA: Insufficient documentation

## 2020-10-10 DIAGNOSIS — Z170 Estrogen receptor positive status [ER+]: Secondary | ICD-10-CM | POA: Diagnosis not present

## 2020-10-10 DIAGNOSIS — Z79899 Other long term (current) drug therapy: Secondary | ICD-10-CM | POA: Insufficient documentation

## 2020-10-10 NOTE — Assessment & Plan Note (Addendum)
10/11/2017:Left lumpectomy: IDC grade 2, 2.1 cm, margins Negative 0.1 cm to the anterior margin, UDH, 0/3 lymph nodes negative, ER 95%, PR 80%, HER-2 negative, Ki-67 10%, T2N0 stage IA Oncotype DX recurrence score 23: Risk of distant recurrence at 9 years with hormone therapy alone 9% no chemo benefit Adjuvant radiation therapy 11/19/2019to01/03/2018  Treatment plan: Adjuvant antiestrogen therapy with anastrozole 1 mg daily started1/20/2020switched to letrozole 05/18/2019 Severe fluid retention currently on Bumex 2 mg once a day. She will continue Bumex for the next 2 to 3 months and then slowly taper it off and discontinue.  Breast cancer surveillance: 1.Breast MRI 04/25/2019:No evidence of malignancy. 2.Breast exam 10/10/20:Benign 3.Mammogram9/23/22: benign breast density category C.  We discussed about intermittent fasting as a way to lose weight. Return to clinic in1 year for follow-up

## 2020-10-16 DIAGNOSIS — Z9012 Acquired absence of left breast and nipple: Secondary | ICD-10-CM | POA: Diagnosis not present

## 2020-10-16 DIAGNOSIS — Z853 Personal history of malignant neoplasm of breast: Secondary | ICD-10-CM | POA: Diagnosis not present

## 2020-10-22 ENCOUNTER — Other Ambulatory Visit (HOSPITAL_COMMUNITY): Payer: Self-pay

## 2020-10-22 ENCOUNTER — Other Ambulatory Visit: Payer: Self-pay | Admitting: Hematology and Oncology

## 2020-10-22 MED ORDER — CARESTART COVID-19 HOME TEST VI KIT
PACK | 0 refills | Status: DC
  Filled 2020-10-22: qty 4, 4d supply, fill #0

## 2020-10-23 ENCOUNTER — Other Ambulatory Visit (HOSPITAL_COMMUNITY): Payer: Self-pay

## 2020-10-23 MED ORDER — OMEPRAZOLE 20 MG PO CPDR
20.0000 mg | DELAYED_RELEASE_CAPSULE | Freq: Every morning | ORAL | 0 refills | Status: DC
  Filled 2020-10-23: qty 90, 90d supply, fill #0

## 2020-10-23 MED ORDER — LETROZOLE 2.5 MG PO TABS
2.5000 mg | ORAL_TABLET | Freq: Every day | ORAL | 3 refills | Status: DC
  Filled 2020-10-23: qty 90, 90d supply, fill #0

## 2020-11-14 ENCOUNTER — Other Ambulatory Visit (HOSPITAL_COMMUNITY): Payer: Self-pay

## 2020-11-14 DIAGNOSIS — K219 Gastro-esophageal reflux disease without esophagitis: Secondary | ICD-10-CM | POA: Diagnosis not present

## 2020-11-14 DIAGNOSIS — E782 Mixed hyperlipidemia: Secondary | ICD-10-CM | POA: Diagnosis not present

## 2020-11-14 DIAGNOSIS — E109 Type 1 diabetes mellitus without complications: Secondary | ICD-10-CM | POA: Diagnosis not present

## 2020-11-14 DIAGNOSIS — R600 Localized edema: Secondary | ICD-10-CM | POA: Diagnosis not present

## 2020-11-14 MED ORDER — BUMETANIDE 1 MG PO TABS
1.0000 mg | ORAL_TABLET | Freq: Every day | ORAL | 2 refills | Status: DC
  Filled 2020-11-14: qty 90, 90d supply, fill #0

## 2020-11-14 MED ORDER — FUROSEMIDE 20 MG PO TABS
20.0000 mg | ORAL_TABLET | Freq: Every day | ORAL | 2 refills | Status: DC
  Filled 2020-11-14: qty 90, 90d supply, fill #0

## 2020-11-17 ENCOUNTER — Other Ambulatory Visit (HOSPITAL_COMMUNITY): Payer: Self-pay

## 2020-11-21 ENCOUNTER — Other Ambulatory Visit (HOSPITAL_COMMUNITY): Payer: Self-pay

## 2020-11-26 ENCOUNTER — Telehealth: Payer: Self-pay | Admitting: Podiatry

## 2020-11-26 NOTE — Telephone Encounter (Signed)
Patient wanted to know what type of Alexis [name] she can wear for PF. She just wanted to know if there was a specific kind.   Please advise

## 2020-12-02 ENCOUNTER — Other Ambulatory Visit (HOSPITAL_COMMUNITY): Payer: Self-pay

## 2020-12-12 ENCOUNTER — Other Ambulatory Visit: Payer: Self-pay

## 2020-12-12 ENCOUNTER — Other Ambulatory Visit (HOSPITAL_COMMUNITY): Payer: Self-pay

## 2020-12-12 ENCOUNTER — Ambulatory Visit: Payer: 59 | Admitting: Podiatry

## 2020-12-12 ENCOUNTER — Ambulatory Visit: Payer: 59

## 2020-12-12 ENCOUNTER — Encounter: Payer: Self-pay | Admitting: Podiatry

## 2020-12-12 DIAGNOSIS — M722 Plantar fascial fibromatosis: Secondary | ICD-10-CM | POA: Diagnosis not present

## 2020-12-12 MED ORDER — TRIAMCINOLONE ACETONIDE 10 MG/ML IJ SUSP: 10.0000 mg | Freq: Once | INTRAMUSCULAR | Status: DC

## 2020-12-12 MED ORDER — MELOXICAM 15 MG PO TABS
15.0000 mg | ORAL_TABLET | Freq: Every day | ORAL | 0 refills | Status: DC
  Filled 2020-12-12: qty 30, 30d supply, fill #0

## 2020-12-12 NOTE — Patient Instructions (Signed)
Plantar Fasciitis (Heel Spur Syndrome) with Rehab The plantar fascia is a fibrous, ligament-like, soft-tissue structure that spans the bottom of the foot. Plantar fasciitis is a condition that causes pain in the foot due to inflammation of the tissue. SYMPTOMS   Pain and tenderness on the underneath side of the foot.  Pain that worsens with standing or walking. CAUSES  Plantar fasciitis is caused by irritation and injury to the plantar fascia on the underneath side of the foot. Common mechanisms of injury include:  Direct trauma to bottom of the foot.  Damage to a small nerve that runs under the foot where the main fascia attaches to the heel bone.  Stress placed on the plantar fascia due to bone spurs. RISK INCREASES WITH:   Activities that place stress on the plantar fascia (running, jumping, pivoting, or cutting).  Poor strength and flexibility.  Improperly fitted shoes.  Tight calf muscles.  Flat feet.  Failure to warm-up properly before activity.  Obesity. PREVENTION  Warm up and stretch properly before activity.  Allow for adequate recovery between workouts.  Maintain physical fitness:  Strength, flexibility, and endurance.  Cardiovascular fitness.  Maintain a health body weight.  Avoid stress on the plantar fascia.  Wear properly fitted shoes, including arch supports for individuals who have flat feet.  PROGNOSIS  If treated properly, then the symptoms of plantar fasciitis usually resolve without surgery. However, occasionally surgery is necessary.  RELATED COMPLICATIONS   Recurrent symptoms that may result in a chronic condition.  Problems of the lower back that are caused by compensating for the injury, such as limping.  Pain or weakness of the foot during push-off following surgery.  Chronic inflammation, scarring, and partial or complete fascia tear, occurring more often from repeated injections.  TREATMENT  Treatment initially involves the  use of ice and medication to help reduce pain and inflammation. The use of strengthening and stretching exercises may help reduce pain with activity, especially stretches of the Achilles tendon. These exercises may be performed at home or with a therapist. Your caregiver may recommend that you use heel cups of arch supports to help reduce stress on the plantar fascia. Occasionally, corticosteroid injections are given to reduce inflammation. If symptoms persist for greater than 6 months despite non-surgical (conservative), then surgery may be recommended.   MEDICATION   If pain medication is necessary, then nonsteroidal anti-inflammatory medications, such as aspirin and ibuprofen, or other minor pain relievers, such as acetaminophen, are often recommended.  Do not take pain medication within 7 days before surgery.  Prescription pain relievers may be given if deemed necessary by your caregiver. Use only as directed and only as much as you need.  Corticosteroid injections may be given by your caregiver. These injections should be reserved for the most serious cases, because they may only be given a certain number of times.  HEAT AND COLD  Cold treatment (icing) relieves pain and reduces inflammation. Cold treatment should be applied for 10 to 15 minutes every 2 to 3 hours for inflammation and pain and immediately after any activity that aggravates your symptoms. Use ice packs or massage the area with a piece of ice (ice massage).  Heat treatment may be used prior to performing the stretching and strengthening activities prescribed by your caregiver, physical therapist, or athletic trainer. Use a heat pack or soak the injury in warm water.  SEEK IMMEDIATE MEDICAL CARE IF:  Treatment seems to offer no benefit, or the condition worsens.  Any medications   produce adverse side effects.  EXERCISES- RANGE OF MOTION (ROM) AND STRETCHING EXERCISES - Plantar Fasciitis (Heel Spur Syndrome) These exercises  may help you when beginning to rehabilitate your injury. Your symptoms may resolve with or without further involvement from your physician, physical therapist or athletic trainer. While completing these exercises, remember:   Restoring tissue flexibility helps normal motion to return to the joints. This allows healthier, less painful movement and activity.  An effective stretch should be held for at least 30 seconds.  A stretch should never be painful. You should only feel a gentle lengthening or release in the stretched tissue.  RANGE OF MOTION - Toe Extension, Flexion  Sit with your right / left leg crossed over your opposite knee.  Grasp your toes and gently pull them back toward the top of your foot. You should feel a stretch on the bottom of your toes and/or foot.  Hold this stretch for 10 seconds.  Now, gently pull your toes toward the bottom of your foot. You should feel a stretch on the top of your toes and or foot.  Hold this stretch for 10 seconds. Repeat  times. Complete this stretch 3 times per day.   RANGE OF MOTION - Ankle Dorsiflexion, Active Assisted  Remove shoes and sit on a chair that is preferably not on a carpeted surface.  Place right / left foot under knee. Extend your opposite leg for support.  Keeping your heel down, slide your right / left foot back toward the chair until you feel a stretch at your ankle or calf. If you do not feel a stretch, slide your bottom forward to the edge of the chair, while still keeping your heel down.  Hold this stretch for 10 seconds. Repeat 3 times. Complete this stretch 2 times per day.   STRETCH  Gastroc, Standing  Place hands on wall.  Extend right / left leg, keeping the front knee somewhat bent.  Slightly point your toes inward on your back foot.  Keeping your right / left heel on the floor and your knee straight, shift your weight toward the wall, not allowing your back to arch.  You should feel a gentle stretch  in the right / left calf. Hold this position for 10 seconds. Repeat 3 times. Complete this stretch 2 times per day.  STRETCH  Soleus, Standing  Place hands on wall.  Extend right / left leg, keeping the other knee somewhat bent.  Slightly point your toes inward on your back foot.  Keep your right / left heel on the floor, bend your back knee, and slightly shift your weight over the back leg so that you feel a gentle stretch deep in your back calf.  Hold this position for 10 seconds. Repeat 3 times. Complete this stretch 2 times per day.  STRETCH  Gastrocsoleus, Standing  Note: This exercise can place a lot of stress on your foot and ankle. Please complete this exercise only if specifically instructed by your caregiver.   Place the ball of your right / left foot on a step, keeping your other foot firmly on the same step.  Hold on to the wall or a rail for balance.  Slowly lift your other foot, allowing your body weight to press your heel down over the edge of the step.  You should feel a stretch in your right / left calf.  Hold this position for 10 seconds.  Repeat this exercise with a slight bend in your right /   gain both the endurance and the strength needed for everyday activities through controlled exercises. Complete these exercises as instructed by your physician, physical therapist or athletic trainer. Progress the resistance and repetitions only as guided.  STRENGTH - Towel Curls Sit in a chair positioned on a non-carpeted surface. Place your foot on a towel, keeping your heel on the floor. Pull the towel toward your heel by only curling your toes. Keep your heel on the floor. Repeat 3 times.  Complete this exercise 2 times per day.  STRENGTH - Ankle Inversion Secure one end of a rubber exercise band/tubing to a fixed object (table, pole). Loop the other end around your foot just before your toes. Place your fists between your knees. This will focus your strengthening at your ankle. Slowly, pull your big toe up and in, making sure the band/tubing is positioned to resist the entire motion. Hold this position for 10 seconds. Have your muscles resist the band/tubing as it slowly pulls your foot back to the starting position. Repeat 3 times. Complete this exercises 2 times per day.  Document Released: 12/28/2004 Document Revised: 03/22/2011 Document Reviewed: 04/11/2008 [facility] Patient Information 2014 [place], [place].   Walking Boot, Adult A walking boot holds your foot or ankle in place after an injury or a medical procedure. This helps with healing and prevents further injury. It has a hard, rigid outer frame that limits movement and supports your foot and leg. The inner lining is a layer of padded material. Walking boots also have adjustable straps to secure them over the foot and leg. A walking boot may be prescribed if you can put weight (bear weight) on your injured foot. How much you can walk while wearing the boot will depend on the type and severity of your injury. How to put on a walking boot There are different types of walking boots. Each type has specific instructions about how to wear it properly. Follow instructions from your health care provider, such as: Ask someone to help you put on the boot, if needed. Sit to put on your boot. Doing this is more comfortable and helps to prevent falls. Open up the boot fully. Place your foot into the boot so your heel rests against the back. Your toes should be supported by the base of the boot. They should not hang over the front edge. Adjust the straps so the boot fits securely but is not too tight. Do not bend the hard frame of  the boot to get a good fit. How to walk with a walking boot How much you can walk will depend on your injury. Some tips for managing with a boot include: Do not try to walk without wearing the boot unless your health care provider approves. Use other assistive walking devices, including crutches or canes, as told by your health care provider. On your uninjured foot, wear a shoe with a heel that is close to the height of the walking boot. Be careful when walking on surfaces that are uneven or wet. How to reduce swelling while using a walking boot  Rest your injured foot or leg as much as possible. If directed, put ice on the injured area. To do this: Put ice in a plastic bag. Place a towel between your skin and the bag. Leave the ice on for 20 minutes, 2-3 times a day. Remove the ice if your skin turns bright red. This is very important. If you cannot feel pain, heat, or cold, you have a  greater risk of damage to the area. Keep your injured foot or leg raised (elevated) above the level of your heart for at least 2?3 hours each day or as told by your health care provider. If swelling gets worse, loosen the boot. Rest and elevate your foot and leg. How to care for your skin and foot while using a walking boot Wear a long sock to protect your foot and leg from rubbing inside the boot. Take off the boot one time each day to check the injured area. Check your foot, the surrounding skin, and your leg to make sure there are no sores, rashes, swelling, or wounds. The skin should be a healthy color, not pale or blue. Try to notice if your walking pattern (gait) in the boot is fairly normal and that you are walking without a noticeable limp. Follow instructions from your health care provider about taking care of your incision or wound, if this applies. Clean and wash the injured area as told by your health care provider. Gently dry your foot and leg before putting the boot back on. Removing your walking  boot Follow directions from your health care provider for removing the walking boot. Generally, it is okay to remove your walking boot: When you are resting or sleeping. To clean your foot and leg. How to keep the walking boot clean Do not put any part of the boot in a washing machine or dryer. Do not use chemical cleaning products. These could irritate your skin, especially if you have a wound or an incision. Do not soak the liner of the boot. Use a washcloth with mild soap and water to clean the frame and the liner of the boot by hand. Allow the boot to air-dry completely before you put it back on your foot. Follow these instructions at home: Activity Your activity will be restricted depending on the type and severity of your injury. Follow instructions from your health care provider. Also: Bathe and shower as told by your health care provider. Do not do any activities that could make your injury worse. Do not drive if your affected foot is the one that you use for driving. Contact a health care provider if: The boot is cracked or damaged. The boot does not fit properly. Your foot or leg hurts. You have a rash, sore, or open sore (ulcer) on your foot or leg. The skin on your foot or leg is pale. You have a wound or incision on the foot and it is getting worse. Your skin becomes painful, red, or irritated. Your swelling does not get better or it gets worse. Get help right away if: You have numbness in your foot or leg. The skin on your foot or leg is cold, blue, or gray. Summary A walking boot holds your foot or ankle in place after an injury or a medical procedure. There are different types of walking boots. Follow instructions about how to correctly wear your boot. Ask someone to help you put on the boot, if needed. It is important to check your skin and foot every day. Call your health care provider if you notice a rash or sore on your foot or leg. This information is not  intended to replace advice given to you by your health care provider. Make sure you discuss any questions you have with your health care provider. Document Revised: 10/22/2019 Document Reviewed: 10/22/2019 Elsevier Patient Education  2022 [employer].

## 2020-12-12 NOTE — Progress Notes (Signed)
Subjective: 65 year old female presents the office today for concerns of recurrent pain to the right heel.  She states the last injection helped for about 2 months and the pain started come back over the last couple weeks.  She still trying to do the stretching and she is also purchased new shoes.  She feels that a lot of her symptoms come from having to park so far away from her building having to walk to the office.  She is asking for a note to park closer.  No recent injury or changes otherwise.  Objective: AAO x3, NAD DP/PT pulses palpable bilaterally, CRT less than 3 seconds Tenderness to palpation along the plantar medial tubercle of the calcaneus at the insertion of plantar fascia on the right foot in the same area as before.  There is no pain with lateral compression of calcaneus.  There is no pain along the Achilles tendon.  There is no edema, erythema.  Negative Tinel sign. No pain with calf compression, swelling, warmth, erythema  Assessment: Right heel pain, plantar fasciitis  Plan: -All treatment options discussed with the patient including all alternatives, risks, complications.  -At this point discussed repeat steroid injections was to proceed with this.  See procedure note below.  I did place into a cam boot due to the injection and also to help with the plantar fasciitis.  Also refer to physical therapy.  Continue stretching, icing as well and then as she starts to feel better in about a week or 2 she can start to transition back into regular shoe as tolerated. -Note for work to park closer.   Procedure: Injection Tendon/Ligament Discussed alternatives, risks, complications and verbal consent was obtained.  Location: Right plantar fascia at the glabrous junction; medial approach. Skin Prep: Alcohol. Injectate: 0.5cc 0.5% marcaine plain, 0.5 cc 2% lidocaine plain and, 1 cc kenalog 10. Disposition: Patient tolerated procedure well. Injection site dressed with a band-aid.   Post-injection care was discussed and return precautions discussed.    Alexis [name] DPM

## 2021-01-30 ENCOUNTER — Ambulatory Visit: Payer: 59 | Admitting: Podiatry

## 2021-01-30 ENCOUNTER — Other Ambulatory Visit (HOSPITAL_COMMUNITY): Payer: Self-pay

## 2021-01-30 ENCOUNTER — Other Ambulatory Visit: Payer: Self-pay

## 2021-01-30 DIAGNOSIS — G8929 Other chronic pain: Secondary | ICD-10-CM | POA: Diagnosis not present

## 2021-01-30 DIAGNOSIS — M722 Plantar fascial fibromatosis: Secondary | ICD-10-CM | POA: Diagnosis not present

## 2021-01-30 DIAGNOSIS — M79671 Pain in right foot: Secondary | ICD-10-CM | POA: Diagnosis not present

## 2021-01-30 DIAGNOSIS — E782 Mixed hyperlipidemia: Secondary | ICD-10-CM | POA: Diagnosis not present

## 2021-01-30 DIAGNOSIS — R600 Localized edema: Secondary | ICD-10-CM | POA: Diagnosis not present

## 2021-01-30 DIAGNOSIS — E109 Type 1 diabetes mellitus without complications: Secondary | ICD-10-CM | POA: Diagnosis not present

## 2021-01-30 DIAGNOSIS — K219 Gastro-esophageal reflux disease without esophagitis: Secondary | ICD-10-CM | POA: Diagnosis not present

## 2021-01-30 MED ORDER — MELOXICAM 7.5 MG PO TABS
7.5000 mg | ORAL_TABLET | Freq: Every day | ORAL | 0 refills | Status: DC | PRN
  Filled 2021-01-30 – 2021-02-09 (×2): qty 14, 14d supply, fill #0

## 2021-01-30 NOTE — Patient Instructions (Signed)

## 2021-01-31 NOTE — Progress Notes (Signed)
Subjective: 66 year old female presents the office today for concerns of recurrent pain to the right heel.  She states that she is doing much better.  She is walking normally and she is not wearing the cam boot.  Gets occasional discomfort but overall doing much better.  No recent injury or changes in the last saw her.  She has no new concerns today.   Objective: AAO x3, NAD DP/PT pulses palpable bilaterally, CRT less than 3 seconds There is slight tenderness to palpation along the plantar medial tubercle of the calcaneus at the insertion of plantar fascia on the right foot in the same area as before.  There is no pain with lateral compression of calcaneus.  There is no pain along the Achilles tendon.  There is no edema, erythema.  Negative Tinel sign.  Wearing a regular shoe with a normal gait. No pain with calf compression, swelling, warmth, erythema  Assessment: Right heel pain, plantar fasciitis  Plan: -All treatment options discussed with the patient including all alternatives, risks, complications.  -Overall doing better.  At this point we will hold off another steroid injection.  I encouraged to continue with stretching, icing daily on her regular basis as well as wearing shoes and good arch support.  Monitor for any reoccurrence.  We will see her back as needed.  Trula Slade DPM

## 2021-02-06 ENCOUNTER — Other Ambulatory Visit (HOSPITAL_COMMUNITY): Payer: Self-pay

## 2021-02-06 MED ORDER — TRULICITY 3 MG/0.5ML ~~LOC~~ SOAJ
3.0000 mg | SUBCUTANEOUS | 3 refills | Status: DC
  Filled 2021-02-06 – 2021-02-09 (×2): qty 2, 28d supply, fill #0
  Filled 2021-03-09: qty 2, 28d supply, fill #1
  Filled 2021-04-07: qty 2, 28d supply, fill #0

## 2021-02-09 ENCOUNTER — Other Ambulatory Visit (HOSPITAL_COMMUNITY): Payer: Self-pay

## 2021-02-11 ENCOUNTER — Other Ambulatory Visit (HOSPITAL_COMMUNITY): Payer: Self-pay

## 2021-02-12 ENCOUNTER — Other Ambulatory Visit (HOSPITAL_COMMUNITY): Payer: Self-pay

## 2021-02-12 MED ORDER — OMEPRAZOLE 20 MG PO CPDR
20.0000 mg | DELAYED_RELEASE_CAPSULE | Freq: Every day | ORAL | 0 refills | Status: DC
  Filled 2021-02-12: qty 90, 90d supply, fill #0

## 2021-02-20 ENCOUNTER — Other Ambulatory Visit (HOSPITAL_COMMUNITY): Payer: Self-pay

## 2021-02-20 MED ORDER — FARXIGA 10 MG PO TABS
10.0000 mg | ORAL_TABLET | Freq: Every day | ORAL | 3 refills | Status: DC
  Filled 2021-02-20: qty 90, 90d supply, fill #0

## 2021-03-04 ENCOUNTER — Other Ambulatory Visit: Payer: Self-pay

## 2021-03-05 ENCOUNTER — Other Ambulatory Visit (HOSPITAL_COMMUNITY): Payer: Self-pay

## 2021-03-09 ENCOUNTER — Other Ambulatory Visit (HOSPITAL_COMMUNITY): Payer: Self-pay

## 2021-03-24 ENCOUNTER — Other Ambulatory Visit (HOSPITAL_COMMUNITY): Payer: Self-pay

## 2021-03-26 ENCOUNTER — Other Ambulatory Visit: Payer: Self-pay

## 2021-03-26 ENCOUNTER — Ambulatory Visit: Payer: 59 | Admitting: Physician Assistant

## 2021-03-26 DIAGNOSIS — Z1283 Encounter for screening for malignant neoplasm of skin: Secondary | ICD-10-CM

## 2021-03-26 DIAGNOSIS — L820 Inflamed seborrheic keratosis: Secondary | ICD-10-CM

## 2021-04-01 ENCOUNTER — Other Ambulatory Visit (HOSPITAL_COMMUNITY): Payer: Self-pay

## 2021-04-01 ENCOUNTER — Encounter: Payer: Self-pay | Admitting: Physician Assistant

## 2021-04-01 NOTE — Progress Notes (Signed)
? ?  Follow-Up Visit ?  ?Subjective  ?Alexis [name] is a 66 y.o. female who presents for the following: Annual Exam (Here for yearly skin exam. Concerns right cheek. No history of skin cancer. ). ? ? ?The following portions of the chart were reviewed this encounter and updated as appropriate:  Tobacco  Allergies  Meds  Problems  Med Hx  Surg Hx  Fam Hx   ?  ? ?Objective  ?Well appearing patient in no apparent distress; mood and affect are within normal limits. ? ?A full examination was performed including scalp, head, eyes, ears, nose, lips, neck, chest, axillae, abdomen, back, buttocks, bilateral upper extremities, bilateral lower extremities, hands, feet, fingers, toes, fingernails, and toenails. All findings within normal limits unless otherwise noted below. ? ?Head - Anterior (Face) (10) ?Brown crust on an erythematous base.  ? ? ?Assessment & Plan  ?Seborrheic keratosis, inflamed (10) ?Head - Anterior (Face) ? ?Destruction of lesion - Head - Anterior (Face) ?Complexity: simple   ?Destruction method: cryotherapy   ?Informed consent: discussed and consent obtained   ?Timeout:  patient name, date of birth, surgical site, and procedure verified ?Lesion destroyed using liquid nitrogen: Yes   ?Cryotherapy cycles:  3 ?Outcome: patient tolerated procedure well with no complications   ?Post-procedure details: wound care instructions given   ? ?No atypical nevi noted at the time of the visit. ? ?I, Trinnity Breunig, PA-C, have reviewed all documentation's for this visit.  The documentation on 04/01/21 for the exam, diagnosis, procedures and orders are all accurate and complete. ?

## 2021-04-07 ENCOUNTER — Other Ambulatory Visit: Payer: Self-pay

## 2021-04-08 ENCOUNTER — Encounter: Payer: Self-pay | Admitting: Podiatry

## 2021-04-09 ENCOUNTER — Ambulatory Visit (INDEPENDENT_AMBULATORY_CARE_PROVIDER_SITE_OTHER): Payer: 59

## 2021-04-09 ENCOUNTER — Other Ambulatory Visit (HOSPITAL_COMMUNITY): Payer: Self-pay

## 2021-04-09 ENCOUNTER — Ambulatory Visit: Payer: 59

## 2021-04-09 ENCOUNTER — Ambulatory Visit: Payer: 59 | Admitting: Podiatry

## 2021-04-09 DIAGNOSIS — M722 Plantar fascial fibromatosis: Secondary | ICD-10-CM

## 2021-04-09 DIAGNOSIS — R52 Pain, unspecified: Secondary | ICD-10-CM | POA: Diagnosis not present

## 2021-04-09 MED ORDER — TRIAMCINOLONE ACETONIDE 10 MG/ML IJ SUSP: 10.0000 mg | Freq: Once | INTRAMUSCULAR | Status: DC

## 2021-04-09 MED ORDER — MELOXICAM 15 MG PO TABS
15.0000 mg | ORAL_TABLET | Freq: Every day | ORAL | 0 refills | Status: AC | PRN
  Filled 2021-04-09: qty 30, 30d supply, fill #0

## 2021-04-09 NOTE — Patient Instructions (Signed)

## 2021-04-10 ENCOUNTER — Other Ambulatory Visit (HOSPITAL_COMMUNITY): Payer: Self-pay

## 2021-04-14 NOTE — Progress Notes (Signed)
Subjective: ?66 year old female presents the office today for concerns of continued pain to the left foot on the heel, arch.  She states that she still having some discomfort on the right side but is improved.  She denies any new injuries or concerns.  She has tried to increase her walking more which may have aggravated her symptoms on the left side.  She still doing the stretching, icing. ? ?Objective: ?AAO x3, NAD ?DP/PT pulses palpable bilaterally, CRT less than 3 seconds ?There is tenderness to palpation along the plantar medial tubercle of the calcaneus at the insertion of plantar fascia on the left side as well as the right side.  Mild discomfort on the arch of the foot on the left side as well.  There is no area of pinpoint tenderness bilaterally.  No edema, erythema.  MMT 5/5. ?No pain with calf compression, swelling, warmth, erythema ? ?Assessment: ?Plantar fasciitis ? ?Plan: ?-All treatment options discussed with the patient including all alternatives, risks, complications.  ?-X-rays obtained and reviewed of the left foot and 3 views were obtained.  Posterior calcaneal spurring is present.  No evidence of acute fracture. ?-Steroid injection performed of the left foot.  See procedure note below.  Continue stretching, icing daily.  Dispensed power step inserts and break-in instructions were discussed.  Continue with supportive shoe gear as well. ?-Patient encouraged to call the office with any questions, concerns, change in symptoms.  ? ?Procedure: Injection Tendon/Ligament ?Discussed alternatives, risks, complications and verbal consent was obtained.  ?Location: Left plantar fascia at the glabrous junction; medial approach. ?Skin Prep: Alcohol. ?Injectate: 0.5cc 0.5% marcaine plain, 0.5 cc 2% lidocaine plain and, 1 cc kenalog 10. ?Disposition: Patient tolerated procedure well. Injection site dressed with a band-aid.  ?Post-injection care was discussed and return precautions discussed.  ? ?Return in about 6  weeks (around 05/21/2021). ? ?Trula Slade DPM ? ?

## 2021-05-01 ENCOUNTER — Other Ambulatory Visit (HOSPITAL_COMMUNITY): Payer: Self-pay

## 2021-05-01 DIAGNOSIS — E782 Mixed hyperlipidemia: Secondary | ICD-10-CM | POA: Diagnosis not present

## 2021-05-01 DIAGNOSIS — R600 Localized edema: Secondary | ICD-10-CM | POA: Diagnosis not present

## 2021-05-01 DIAGNOSIS — E109 Type 1 diabetes mellitus without complications: Secondary | ICD-10-CM | POA: Diagnosis not present

## 2021-05-01 DIAGNOSIS — K5792 Diverticulitis of intestine, part unspecified, without perforation or abscess without bleeding: Secondary | ICD-10-CM | POA: Diagnosis not present

## 2021-05-01 DIAGNOSIS — K219 Gastro-esophageal reflux disease without esophagitis: Secondary | ICD-10-CM | POA: Diagnosis not present

## 2021-05-01 MED ORDER — TRULICITY 4.5 MG/0.5ML ~~LOC~~ SOAJ
4.5000 mg | SUBCUTANEOUS | 3 refills | Status: DC
Start: 2021-05-01 — End: 2021-08-31
  Filled 2021-05-01: qty 2, 28d supply, fill #0
  Filled 2021-06-02: qty 2, 28d supply, fill #1
  Filled 2021-07-01: qty 2, 28d supply, fill #2
  Filled 2021-07-27: qty 2, 28d supply, fill #3

## 2021-05-05 ENCOUNTER — Other Ambulatory Visit (HOSPITAL_COMMUNITY): Payer: Self-pay

## 2021-05-21 ENCOUNTER — Ambulatory Visit: Payer: 59 | Admitting: Podiatry

## 2021-05-21 DIAGNOSIS — M722 Plantar fascial fibromatosis: Secondary | ICD-10-CM

## 2021-05-21 NOTE — Patient Instructions (Signed)

## 2021-05-23 NOTE — Progress Notes (Signed)
Subjective: ?66 year old female presents the office today for follow-up evaluation of heel pain, Planter fasciitis.  Since her last appointment she states that she has been doing well she is not having any pain currently.  She is actually increased her walking without any significant pain.  The power steps are helpful.  No recent injury or changes otherwise.   ? ?Objective: ?AAO x3, NAD ?DP/PT pulses palpable bilaterally, CRT less than 3 seconds ?At this time there is no tenderness to palpation along the plantar medial tubercle of the calcaneus at the insertion of plantar fascia on the left side as well as the right side.  There is no discomfort on the arch of the foot on the left side as well.  There is no area of pinpoint tenderness bilaterally.  No edema, erythema.  MMT 5/5. ?No pain with calf compression, swelling, warmth, erythema ? ?Assessment: ?Plantar fasciitis-improvement ? ?Plan: ?-All treatment options discussed with the patient including all alternatives, risks, complications.  ?-Overall doing much better no pain currently.  We discussed the importance of continued stretching, icing on a regular basis as well as wearing shoes and good arch supports.  She is in continue with power step inserts.  Discussed gradual increasing walking as tolerated but may consider she is stretching before and after. ? ?Return if symptoms worsen or fail to improve. ? ?Trula Slade DPM ? ?

## 2021-05-25 ENCOUNTER — Other Ambulatory Visit (HOSPITAL_COMMUNITY): Payer: Self-pay

## 2021-05-26 ENCOUNTER — Other Ambulatory Visit (HOSPITAL_COMMUNITY): Payer: Self-pay

## 2021-05-26 MED ORDER — FARXIGA 10 MG PO TABS
10.0000 mg | ORAL_TABLET | Freq: Every day | ORAL | 3 refills | Status: DC
  Filled 2021-05-26: qty 90, 90d supply, fill #0

## 2021-05-27 ENCOUNTER — Other Ambulatory Visit (HOSPITAL_COMMUNITY): Payer: Self-pay

## 2021-06-02 ENCOUNTER — Other Ambulatory Visit (HOSPITAL_COMMUNITY): Payer: Self-pay

## 2021-06-03 ENCOUNTER — Other Ambulatory Visit (HOSPITAL_COMMUNITY): Payer: Self-pay

## 2021-07-01 ENCOUNTER — Other Ambulatory Visit (HOSPITAL_COMMUNITY): Payer: Self-pay

## 2021-07-10 ENCOUNTER — Other Ambulatory Visit (HOSPITAL_COMMUNITY): Payer: Self-pay

## 2021-07-15 ENCOUNTER — Other Ambulatory Visit (HOSPITAL_COMMUNITY): Payer: Self-pay

## 2021-07-27 ENCOUNTER — Other Ambulatory Visit (HOSPITAL_COMMUNITY): Payer: Self-pay

## 2021-07-27 MED ORDER — HUMULIN R U-500 KWIKPEN 500 UNIT/ML ~~LOC~~ SOPN
PEN_INJECTOR | SUBCUTANEOUS | 0 refills | Status: DC
  Filled 2021-07-27: qty 42, 77d supply, fill #0

## 2021-08-06 DIAGNOSIS — K219 Gastro-esophageal reflux disease without esophagitis: Secondary | ICD-10-CM | POA: Diagnosis not present

## 2021-08-06 DIAGNOSIS — R600 Localized edema: Secondary | ICD-10-CM | POA: Diagnosis not present

## 2021-08-06 DIAGNOSIS — E782 Mixed hyperlipidemia: Secondary | ICD-10-CM | POA: Diagnosis not present

## 2021-08-06 DIAGNOSIS — E109 Type 1 diabetes mellitus without complications: Secondary | ICD-10-CM | POA: Diagnosis not present

## 2021-08-20 ENCOUNTER — Other Ambulatory Visit: Payer: Self-pay | Admitting: Adult Health

## 2021-08-20 DIAGNOSIS — R928 Other abnormal and inconclusive findings on diagnostic imaging of breast: Secondary | ICD-10-CM

## 2021-08-31 ENCOUNTER — Other Ambulatory Visit (HOSPITAL_COMMUNITY): Payer: Self-pay

## 2021-08-31 MED ORDER — FARXIGA 10 MG PO TABS
10.0000 mg | ORAL_TABLET | Freq: Every day | ORAL | 3 refills | Status: DC
  Filled 2021-08-31: qty 30, 30d supply, fill #0
  Filled 2021-09-01: qty 90, 90d supply, fill #0
  Filled 2021-12-08: qty 30, 30d supply, fill #1

## 2021-08-31 MED ORDER — TRULICITY 4.5 MG/0.5ML ~~LOC~~ SOAJ
4.5000 mg | SUBCUTANEOUS | 3 refills | Status: DC
  Filled 2021-08-31: qty 2, 28d supply, fill #0
  Filled 2021-09-30: qty 2, 28d supply, fill #1

## 2021-08-31 MED ORDER — TRULICITY 4.5 MG/0.5ML ~~LOC~~ SOAJ
4.5000 mg | SUBCUTANEOUS | 3 refills | Status: DC
  Filled 2021-08-31 – 2021-10-27 (×3): qty 2, 28d supply, fill #0

## 2021-09-01 ENCOUNTER — Other Ambulatory Visit (HOSPITAL_COMMUNITY): Payer: Self-pay

## 2021-09-01 MED ORDER — FARXIGA 10 MG PO TABS
10.0000 mg | ORAL_TABLET | Freq: Every day | ORAL | 0 refills | Status: DC
  Filled 2021-09-01: qty 90, 90d supply, fill #0

## 2021-09-30 ENCOUNTER — Other Ambulatory Visit (HOSPITAL_COMMUNITY): Payer: Self-pay

## 2021-10-01 ENCOUNTER — Other Ambulatory Visit (HOSPITAL_COMMUNITY): Payer: Self-pay

## 2021-10-01 MED ORDER — LAGEVRIO 200 MG PO CAPS
4.0000 | ORAL_CAPSULE | Freq: Two times a day (BID) | ORAL | 0 refills | Status: DC
  Filled 2021-10-01: qty 40, 5d supply, fill #0

## 2021-10-09 ENCOUNTER — Ambulatory Visit: Payer: 59 | Admitting: Obstetrics & Gynecology

## 2021-10-09 ENCOUNTER — Inpatient Hospital Stay: Payer: 59 | Admitting: Hematology and Oncology

## 2021-10-19 ENCOUNTER — Ambulatory Visit (INDEPENDENT_AMBULATORY_CARE_PROVIDER_SITE_OTHER): Payer: 59 | Admitting: Obstetrics & Gynecology

## 2021-10-19 ENCOUNTER — Encounter: Payer: Self-pay | Admitting: Obstetrics & Gynecology

## 2021-10-19 ENCOUNTER — Other Ambulatory Visit (HOSPITAL_COMMUNITY)
Admission: RE | Admit: 2021-10-19 | Discharge: 2021-10-19 | Disposition: A | Discharge status: Home / Self Care | Payer: 59 | Source: Ambulatory Visit | Attending: Obstetrics & Gynecology | Admitting: Obstetrics & Gynecology

## 2021-10-19 VITALS — BP 110/70 | HR 91 | Ht 62.5 in | Wt 208.0 lb

## 2021-10-19 DIAGNOSIS — Z780 Asymptomatic menopausal state: Secondary | ICD-10-CM | POA: Diagnosis not present

## 2021-10-19 DIAGNOSIS — Z853 Personal history of malignant neoplasm of breast: Secondary | ICD-10-CM | POA: Diagnosis not present

## 2021-10-19 DIAGNOSIS — Z01419 Encounter for gynecological examination (general) (routine) without abnormal findings: Secondary | ICD-10-CM | POA: Insufficient documentation

## 2021-10-19 DIAGNOSIS — N890 Mild vaginal dysplasia: Secondary | ICD-10-CM | POA: Diagnosis not present

## 2021-10-19 DIAGNOSIS — Z1272 Encounter for screening for malignant neoplasm of vagina: Secondary | ICD-10-CM | POA: Insufficient documentation

## 2021-10-19 DIAGNOSIS — Z90710 Acquired absence of both cervix and uterus: Secondary | ICD-10-CM

## 2021-10-19 DIAGNOSIS — Z13820 Encounter for screening for osteoporosis: Secondary | ICD-10-CM

## 2021-10-19 NOTE — Progress Notes (Signed)
Alexis [name] Jan 13, 1955 [id number]   History:    66 y.o. G3P2A1   RP:  Established patient presenting for annual gyn exam    HPI: Postmenopausal. Prior LAVH in 2004 for leiomyoma.  No vaginal bleeding.  Will monitor for any worsening of hot flashes or night sweats.  10/2019 Colpo of vagina Negative.  History of VAIN1.   Last Pap Neg 09/2020.  Left breast cancer diagnosis 2019. Status post lumpectomy and radiation.  Mammogram 10/03/2020 Neg.  Mammo scheduled for 11/2021.  She will continue to follow-up with Dr. Lindi Adie. Colonoscopy 2016. DEXA 09/2016 normal. Schedule BD here now.  Ca++ 1.5 g/d, Vit D, Vit K2 and weight bearing physical activities. Health labs with Fam MD. BMI improved to 37.44 (09/2020 at 41.25).   Past medical history,surgical history, family history and social history were all reviewed and documented in the EPIC chart.  Gynecologic History No LMP recorded. Patient has had a hysterectomy.  Obstetric History OB History  Gravida Para Term Preterm AB Living  '3 2 2   1 2  '$ SAB IAB Ectopic Multiple Live Births  1            # Outcome Date GA Lbr Len/2nd Weight Sex Delivery Anes PTL Lv  3 SAB           2 Term           1 Term             ROS: A ROS was performed and pertinent positives and negatives are included in the history. GENERAL: No fevers or chills. HEENT: No change in vision, no earache, sore throat or sinus congestion. NECK: No pain or stiffness. CARDIOVASCULAR: No chest pain or pressure. No palpitations. PULMONARY: No shortness of breath, cough or wheeze. GASTROINTESTINAL: No abdominal pain, nausea, vomiting or diarrhea, melena or bright red blood per rectum. GENITOURINARY: No urinary frequency, urgency, hesitancy or dysuria. MUSCULOSKELETAL: No joint or muscle pain, no back pain, no recent trauma. DERMATOLOGIC: No rash, no itching, no lesions. ENDOCRINE: No polyuria, polydipsia, no heat or cold intolerance. No recent change in weight. HEMATOLOGICAL: No  anemia or easy bruising or bleeding. NEUROLOGIC: No headache, seizures, numbness, tingling or weakness. PSYCHIATRIC: No depression, no loss of interest in normal activity or change in sleep pattern.     Exam: BP 110/70   Pulse 91   Ht 5' 2.5" (1.588 m)   Wt 208 lb (94.3 kg)   SpO2 96%   BMI 37.44 kg/m   Body mass index is 37.44 kg/m.  General appearance : Well developed well nourished female. No acute distress HEENT: Eyes: no retinal hemorrhage or exudates,  Neck supple, trachea midline, no carotid bruits, no thyroidmegaly Lungs: Clear to auscultation, no rhonchi or wheezes, or rib retractions  Heart: Regular rate and rhythm, no murmurs or gallops Breast:Examined in sitting and supine position were symmetrical in appearance, no palpable masses or tenderness,  no skin retraction, no nipple inversion, no nipple discharge, no skin discoloration, no axillary or supraclavicular lymphadenopathy Abdomen: no palpable masses or tenderness, no rebound or guarding Extremities: no edema or skin discoloration or tenderness  Pelvic: Vulva: Normal             Vagina: No gross lesions or discharge.  Pap reflex done.  Cervix/Uterus absent  Adnexa  Without masses or tenderness  Anus: Normal   Assessment/Plan:  66 y.o. female for annual exam   1. Encounter for Papanicolaou smear of vagina as part of  routine gynecological examination Postmenopausal. Prior LAVH in 2004 for leiomyoma.  No vaginal bleeding.  Will monitor for any worsening of hot flashes or night sweats.  10/2019 Colpo of vagina Negative.  History of VAIN1.   Last Pap Neg 09/2020.  Left breast cancer diagnosis 2019. Status post lumpectomy and radiation.  Mammogram 10/03/2020 Neg.  Mammo scheduled for 11/2021.  She will continue to follow-up with Dr. Lindi Adie. Colonoscopy 2016. DEXA 09/2016 normal. Schedule BD here now.  Ca++ 1.5 g/d, Vit D, Vit K2 and weight bearing physical activities. Health labs with Fam MD. BMI improved to 37.44 (09/2020 at  41.25). - Cytology - PAP( [facility])  2. VAIN I (vaginal intraepithelial neoplasia grade I) - Cytology - PAP( [facility])  3. S/P laparoscopic assisted vaginal hysterectomy (LAVH)  4. Postmenopause Postmenopausal. Prior LAVH in 2004 for leiomyoma.  No vaginal bleeding.  Will monitor for any worsening of hot flashes or night sweats.  - DG Bone Density; Future  5. Screening for osteoporosis DEXA 09/2016 normal. Schedule BD here now.  Ca++ 1.5 g/d, Vit D, Vit K2 and weight bearing physical activities. - DG Bone Density; Future  6. History of left breast cancer  7. Class 2 severe obesity due to excess calories with serious comorbidity and body mass index (BMI) of 37.0 to 37.9 in adult (HCC)  BMI improved to 37.44 (09/2020 at 41.25). Continue on low calorie/carb diet.  Walks daily.  Princess Bruins MD, 3:01 PM 10/19/2021

## 2021-10-20 NOTE — Progress Notes (Signed)
Patient Care Team: Rebekah Chesterfield, NP as PCP - General (Internal Medicine) Darnell Level, MD as Consulting Physician (General Surgery) Serena Croissant, MD as Consulting Physician (Hematology and Oncology) Antony Blackbird, MD as Consulting Physician (Radiation Oncology) Glyn Ade, PA-C as Physician Assistant (Dermatology) Genia Del, MD as Consulting Physician (Obstetrics and Gynecology)  DIAGNOSIS:  Encounter Diagnosis  Name Primary?   Carcinoma of upper-outer quadrant of left breast in female, estrogen receptor positive (HCC)     SUMMARY OF ONCOLOGIC HISTORY: Oncology History  Carcinoma of upper-outer quadrant of left breast in female, estrogen receptor positive (HCC)  10/09/2017 Initial Diagnosis   Carcinoma of upper-outer quadrant of left breast in female, estrogen receptor positive (HCC)   10/20/2017 Surgery   Left lumpectomy: IDC grade 2, 2.1 cm, margins Negative 0.1 cm to the anterior margin, UDH, 0/3 lymph nodes negative, ER 95%, PR 80%, HER-2 negative, Ki-67 10%, T2N0 stage IA   10/25/2017 Cancer Staging   Staging form: Breast, AJCC 8th Edition - Pathologic: Stage IA (pT2, pN0(sn), cM0, G2, ER+, PR+, HER2-) - Signed by Serena Croissant, MD on 10/25/2017   11/02/2017 Oncotype testing   Oncotype DX recurrence score 23, distant recurrence at 9 years 9%, no chemo benefit   11/29/2017 - 01/13/2018 Radiation Therapy   Adjuvant radiation therapy   01/2018 -  Anti-estrogen oral therapy   Anastrozole daily, switched to letrozole 05/18/19     CHIEF COMPLIANT: Follow-up of left breast cancer on letrozole    INTERVAL HISTORY: Alexis [name] is a 66 y.o. with above-mentioned history of  left breast cancer treated with lumpectomy, radiation, and who is currently on antiestrogen therapy with letrozole. She is tolerating the letrozole extremely well. She reports that she has lost weight by walking and watching what she eats. She denies fluid intake. She does drink a  lot of water and watching her sugars. She denies any pain or discomfort in breast.   ALLERGIES:  is allergic to canagliflozin, empagliflozin, metformin hcl, and parlodel [bromocriptine mesylate].  MEDICATIONS:  Current Outpatient Medications  Medication Sig Dispense Refill   ACCU-CHEK FASTCLIX LANCETS MISC USE 3 TIMES A DAY AS DIRECTED  3   ACCU-CHEK GUIDE test strip USE 3 TIMES A DAY AS DIRECTED  3   aspirin 81 MG EC tablet 1 tablet     cetirizine (ZYRTEC) 10 MG tablet Take 10 mg by mouth daily.     dapagliflozin propanediol (FARXIGA) 10 MG TABS tablet Take 1 tablet (10 mg total) by mouth daily. 30 tablet 3   Dulaglutide (TRULICITY) 4.5 MG/0.5ML SOPN Inject 4.5 mg into the skin once a week. 2 mL 3   insulin regular human CONCENTRATED (HUMULIN R U-500 KWIKPEN) 500 UNIT/ML KwikPen INJECT 100 UNITS BEFORE BREAKFAST, 70 UNITS BEFORE LUNCH, 100 UNITS BEFORE EVENING MEALS THIRTY MINUTES BEFORE MEALS UNDER THE SKIN 48 mL 0   meloxicam (MOBIC) 15 MG tablet Take 1 tablet (15 mg total) by mouth daily as needed for pain. 30 tablet 0   omeprazole (PRILOSEC) 20 MG capsule Take 1 capsule (20 mg total) by mouth daily before breakfast. (Patient taking differently: Take 20 mg by mouth as needed.) 90 capsule 0   UNIFINE PENTIPS 31G X 5 MM MISC USE TWICE A DAY AS DIRECTED  2   letrozole (FEMARA) 2.5 MG tablet Take 1 tablet (2.5 mg total) by mouth daily. 90 tablet 3   No current facility-administered medications for this visit.    PHYSICAL EXAMINATION: ECOG PERFORMANCE STATUS: 1 - Symptomatic  but completely ambulatory  Vitals:   10/23/21 0953  BP: (!) 159/62  Pulse: 67  Temp: 97.8 F (36.6 C)  SpO2: 97%   Filed Weights   10/23/21 0953  Weight: 212 lb 8 oz (96.4 kg)    BREAST: No palpable masses or nodules in either right or left breasts. No palpable axillary supraclavicular or infraclavicular adenopathy no breast tenderness or nipple discharge. (exam performed in the presence of a  chaperone)  LABORATORY DATA:  I have reviewed the data as listed    Latest Ref Rng & Units 05/27/2020    6:35 AM 02/06/2020    9:09 PM 09/12/2019    8:16 AM  CMP  Glucose 70 - 99 mg/dL 145  72  190   BUN 8 - 23 mg/dL $Remove'10  16  15   'nERlNys$ Creatinine 0.44 - 1.00 mg/dL 0.60  0.72  0.87   Sodium 135 - 145 mmol/L 140  135  139   Potassium 3.5 - 5.1 mmol/L 4.0  3.7  4.2   Chloride 98 - 111 mmol/L 104  102  101   CO2 22 - 32 mmol/L  24  26   Calcium 8.9 - 10.3 mg/dL  9.4  10.2   Total Protein 6.5 - 8.1 g/dL  7.3  7.2   Total Bilirubin 0.3 - 1.2 mg/dL  0.4  0.4   Alkaline Phos 38 - 126 U/L  96  90   AST 15 - 41 U/L  17  10   ALT 0 - 44 U/L  20  12     Lab Results  Component Value Date   WBC 8.1 02/06/2020   HGB 13.6 05/27/2020   HCT 40.0 05/27/2020   MCV 85.7 02/06/2020   PLT 212 02/06/2020   NEUTROABS 4.0 09/12/2019    ASSESSMENT & PLAN:  Carcinoma of upper-outer quadrant of left breast in female, estrogen receptor positive ([facility]) 10/11/2017:Left lumpectomy: IDC grade 2, 2.1 cm, margins Negative 0.1 cm to the anterior margin, UDH, 0/3 lymph nodes negative, ER 95%, PR 80%, HER-2 negative, Ki-67 10%, T2N0 stage IA Oncotype DX recurrence score 23: Risk of distant recurrence at 9 years with hormone therapy alone 9% no chemo benefit Adjuvant radiation therapy 11/29/2017 to 01/13/2018   Treatment plan: Adjuvant antiestrogen therapy with anastrozole 1 mg daily  started 01/30/2018 switched to letrozole 05/18/2019    Breast cancer surveillance: 1.  Breast MRI 04/25/2019: No evidence of malignancy. 2.  Breast exam  10/23/2021: Benign 3.  Mammogram 10/03/20: benign breast density category C.  Next mammogram scheduled for 11/23/2021   She is losing some weight by dieting and exercising regularly. 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   Return to clinic in 1 year for follow-up    No orders of the defined types were placed in this encounter.  The  patient has a good understanding of the overall plan. she agrees with it. she will call with any problems that may develop before the next visit here. Total time spent: 30 mins including face to face time and time spent for planning, charting and co-ordination of care   Harriette Ohara, MD 10/23/21    I Gardiner Coins am scribing for Dr. Lindi Adie  I have reviewed the above documentation for accuracy and completeness, and I agree with the above.

## 2021-10-23 ENCOUNTER — Other Ambulatory Visit: Payer: Self-pay

## 2021-10-23 ENCOUNTER — Inpatient Hospital Stay: Payer: 59 | Attending: Hematology and Oncology | Admitting: Hematology and Oncology

## 2021-10-23 ENCOUNTER — Other Ambulatory Visit (HOSPITAL_COMMUNITY): Payer: Self-pay

## 2021-10-23 DIAGNOSIS — C50412 Malignant neoplasm of upper-outer quadrant of left female breast: Secondary | ICD-10-CM | POA: Insufficient documentation

## 2021-10-23 DIAGNOSIS — Z79811 Long term (current) use of aromatase inhibitors: Secondary | ICD-10-CM | POA: Insufficient documentation

## 2021-10-23 DIAGNOSIS — Z170 Estrogen receptor positive status [ER+]: Secondary | ICD-10-CM | POA: Diagnosis not present

## 2021-10-23 MED ORDER — LETROZOLE 2.5 MG PO TABS
2.5000 mg | ORAL_TABLET | Freq: Every day | ORAL | 3 refills | Status: DC
  Filled 2021-10-23: qty 90, 90d supply, fill #0

## 2021-10-23 NOTE — Assessment & Plan Note (Signed)
10/11/2017:Left lumpectomy: IDC grade 2, 2.1 cm, margins Negative 0.1 cm to the anterior margin, UDH, 0/3 lymph nodes negative, ER 95%, PR 80%, HER-2 negative, Ki-67 10%, T2N0 stage IA Oncotype DX recurrence score 23: Risk of distant recurrence at 9 years with hormone therapy alone 9% no chemo benefit Adjuvant radiation therapy 11/19/2019to01/03/2018  Treatment plan: Adjuvant antiestrogen therapy with anastrozole 1 mg daily started1/20/2020switched to letrozole 05/18/2019 Severe fluid retentioncurrently on Bumex 2 mg once a day.  When the fluid retention is not severe she takes Lasix.  Sometimes she does not need to take any diuretic.  Breast cancer surveillance: 1.Breast MRI 04/25/2019:No evidence of malignancy. 2.Breast exam  10/23/2021:Benign 3.Mammogram9/23/22: benign breast density category C.  Next mammogram scheduled for 11/23/2021  I encouraged her to exercise regularly more often. 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  Return to clinic in1 year for follow-up

## 2021-10-27 ENCOUNTER — Other Ambulatory Visit: Payer: Self-pay | Admitting: Obstetrics & Gynecology

## 2021-10-27 ENCOUNTER — Other Ambulatory Visit (HOSPITAL_COMMUNITY): Payer: Self-pay

## 2021-10-27 ENCOUNTER — Ambulatory Visit (INDEPENDENT_AMBULATORY_CARE_PROVIDER_SITE_OTHER): Payer: 59

## 2021-10-27 DIAGNOSIS — M8588 Other specified disorders of bone density and structure, other site: Secondary | ICD-10-CM

## 2021-10-27 DIAGNOSIS — Z780 Asymptomatic menopausal state: Secondary | ICD-10-CM

## 2021-10-27 DIAGNOSIS — Z13820 Encounter for screening for osteoporosis: Secondary | ICD-10-CM | POA: Diagnosis not present

## 2021-10-28 LAB — CYTOLOGY - PAP
Comment: NEGATIVE
Diagnosis: UNDETERMINED — AB
High risk HPV: NEGATIVE

## 2021-10-29 ENCOUNTER — Other Ambulatory Visit (HOSPITAL_COMMUNITY): Payer: Self-pay

## 2021-10-29 MED ORDER — PEG 3350-KCL-NA BICARB-NACL 420 G PO SOLR
ORAL | 0 refills | Status: DC
  Filled 2021-10-29: qty 4000, 1d supply, fill #0

## 2021-11-05 ENCOUNTER — Other Ambulatory Visit (HOSPITAL_COMMUNITY): Payer: Self-pay

## 2021-11-06 DIAGNOSIS — E782 Mixed hyperlipidemia: Secondary | ICD-10-CM | POA: Diagnosis not present

## 2021-11-06 DIAGNOSIS — K219 Gastro-esophageal reflux disease without esophagitis: Secondary | ICD-10-CM | POA: Diagnosis not present

## 2021-11-06 DIAGNOSIS — R600 Localized edema: Secondary | ICD-10-CM | POA: Diagnosis not present

## 2021-11-06 DIAGNOSIS — E109 Type 1 diabetes mellitus without complications: Secondary | ICD-10-CM | POA: Diagnosis not present

## 2021-11-10 ENCOUNTER — Other Ambulatory Visit (HOSPITAL_COMMUNITY): Payer: Self-pay

## 2021-11-10 MED ORDER — MOUNJARO 5 MG/0.5ML ~~LOC~~ SOAJ
5.0000 mg | SUBCUTANEOUS | 3 refills | Status: DC
  Filled 2021-11-10: qty 2, 28d supply, fill #0
  Filled 2022-03-02: qty 2, 28d supply, fill #1

## 2021-11-11 ENCOUNTER — Other Ambulatory Visit (HOSPITAL_COMMUNITY): Payer: Self-pay

## 2021-11-13 ENCOUNTER — Other Ambulatory Visit (HOSPITAL_COMMUNITY): Payer: Self-pay

## 2021-11-13 DIAGNOSIS — E109 Type 1 diabetes mellitus without complications: Secondary | ICD-10-CM | POA: Diagnosis not present

## 2021-11-17 DIAGNOSIS — Z86010 Personal history of colonic polyps: Secondary | ICD-10-CM | POA: Diagnosis not present

## 2021-11-17 DIAGNOSIS — K5730 Diverticulosis of large intestine without perforation or abscess without bleeding: Secondary | ICD-10-CM | POA: Diagnosis not present

## 2021-11-17 DIAGNOSIS — K649 Unspecified hemorrhoids: Secondary | ICD-10-CM | POA: Diagnosis not present

## 2021-11-17 DIAGNOSIS — Z980 Intestinal bypass and anastomosis status: Secondary | ICD-10-CM | POA: Diagnosis not present

## 2021-11-17 DIAGNOSIS — K635 Polyp of colon: Secondary | ICD-10-CM | POA: Diagnosis not present

## 2021-11-23 ENCOUNTER — Ambulatory Visit
Admission: RE | Admit: 2021-11-23 | Discharge: 2021-11-23 | Disposition: A | Discharge status: Home / Self Care | Payer: 59 | Source: Ambulatory Visit | Attending: Adult Health | Admitting: Adult Health

## 2021-11-23 DIAGNOSIS — Z853 Personal history of malignant neoplasm of breast: Secondary | ICD-10-CM | POA: Diagnosis not present

## 2021-11-23 DIAGNOSIS — R928 Other abnormal and inconclusive findings on diagnostic imaging of breast: Secondary | ICD-10-CM

## 2021-11-23 DIAGNOSIS — R92333 Mammographic heterogeneous density, bilateral breasts: Secondary | ICD-10-CM | POA: Diagnosis not present

## 2021-11-27 DIAGNOSIS — E109 Type 1 diabetes mellitus without complications: Secondary | ICD-10-CM | POA: Diagnosis not present

## 2021-11-27 DIAGNOSIS — K219 Gastro-esophageal reflux disease without esophagitis: Secondary | ICD-10-CM | POA: Diagnosis not present

## 2021-11-27 DIAGNOSIS — E782 Mixed hyperlipidemia: Secondary | ICD-10-CM | POA: Diagnosis not present

## 2021-12-08 ENCOUNTER — Other Ambulatory Visit: Payer: Self-pay

## 2021-12-08 ENCOUNTER — Other Ambulatory Visit (HOSPITAL_COMMUNITY): Payer: Self-pay

## 2021-12-09 ENCOUNTER — Other Ambulatory Visit (HOSPITAL_COMMUNITY): Payer: Self-pay

## 2021-12-09 DIAGNOSIS — E109 Type 1 diabetes mellitus without complications: Secondary | ICD-10-CM | POA: Diagnosis not present

## 2021-12-09 MED ORDER — MOUNJARO 7.5 MG/0.5ML ~~LOC~~ SOAJ
7.5000 mg | SUBCUTANEOUS | 2 refills | Status: DC
  Filled 2021-12-09: qty 2, 28d supply, fill #0
  Filled 2021-12-31 – 2022-01-01 (×2): qty 2, 28d supply, fill #1
  Filled 2022-02-03: qty 2, 28d supply, fill #2

## 2021-12-31 ENCOUNTER — Other Ambulatory Visit (HOSPITAL_COMMUNITY): Payer: Self-pay

## 2021-12-31 MED ORDER — FARXIGA 10 MG PO TABS
10.0000 mg | ORAL_TABLET | Freq: Every day | ORAL | 3 refills | Status: DC
  Filled 2021-12-31: qty 90, 90d supply, fill #0

## 2021-12-31 MED ORDER — FARXIGA 10 MG PO TABS
10.0000 mg | ORAL_TABLET | Freq: Every day | ORAL | 0 refills | Status: DC
  Filled 2022-04-15: qty 90, 90d supply, fill #0

## 2022-01-01 ENCOUNTER — Other Ambulatory Visit (HOSPITAL_COMMUNITY): Payer: Self-pay

## 2022-01-06 ENCOUNTER — Other Ambulatory Visit (HOSPITAL_COMMUNITY): Payer: Self-pay

## 2022-02-03 ENCOUNTER — Other Ambulatory Visit (HOSPITAL_COMMUNITY): Payer: Self-pay

## 2022-02-19 DIAGNOSIS — E782 Mixed hyperlipidemia: Secondary | ICD-10-CM | POA: Diagnosis not present

## 2022-02-19 DIAGNOSIS — K219 Gastro-esophageal reflux disease without esophagitis: Secondary | ICD-10-CM | POA: Diagnosis not present

## 2022-02-19 DIAGNOSIS — G4710 Hypersomnia, unspecified: Secondary | ICD-10-CM | POA: Diagnosis not present

## 2022-02-19 DIAGNOSIS — E109 Type 1 diabetes mellitus without complications: Secondary | ICD-10-CM | POA: Diagnosis not present

## 2022-02-26 ENCOUNTER — Other Ambulatory Visit (HOSPITAL_COMMUNITY): Payer: Self-pay

## 2022-02-26 MED ORDER — GLUCOSE BLOOD VI STRP
ORAL_STRIP | 11 refills | Status: AC
  Filled 2022-02-26: qty 200, 50d supply, fill #0

## 2022-03-02 ENCOUNTER — Other Ambulatory Visit (HOSPITAL_COMMUNITY): Payer: Self-pay

## 2022-03-03 ENCOUNTER — Other Ambulatory Visit (HOSPITAL_COMMUNITY): Payer: Self-pay

## 2022-03-03 MED ORDER — MOUNJARO 7.5 MG/0.5ML ~~LOC~~ SOAJ
7.5000 mg | SUBCUTANEOUS | 2 refills | Status: DC
  Filled 2022-03-03 – 2022-03-10 (×2): qty 2, 28d supply, fill #0
  Filled 2022-03-29: qty 2, 28d supply, fill #1
  Filled 2022-05-03: qty 2, 28d supply, fill #0
  Filled 2022-05-03: qty 2, 28d supply, fill #2

## 2022-03-04 ENCOUNTER — Other Ambulatory Visit (HOSPITAL_COMMUNITY): Payer: Self-pay

## 2022-03-08 ENCOUNTER — Other Ambulatory Visit (HOSPITAL_COMMUNITY): Payer: Self-pay

## 2022-03-09 ENCOUNTER — Other Ambulatory Visit (HOSPITAL_COMMUNITY): Payer: Self-pay

## 2022-03-10 ENCOUNTER — Other Ambulatory Visit (HOSPITAL_COMMUNITY): Payer: Self-pay

## 2022-03-10 ENCOUNTER — Other Ambulatory Visit: Payer: Self-pay

## 2022-03-11 ENCOUNTER — Other Ambulatory Visit (HOSPITAL_COMMUNITY): Payer: Self-pay

## 2022-03-29 ENCOUNTER — Other Ambulatory Visit: Payer: Self-pay

## 2022-03-30 ENCOUNTER — Other Ambulatory Visit: Payer: Self-pay

## 2022-03-31 ENCOUNTER — Ambulatory Visit: Payer: 59 | Admitting: Physician Assistant

## 2022-04-02 ENCOUNTER — Other Ambulatory Visit: Payer: Self-pay

## 2022-04-05 ENCOUNTER — Other Ambulatory Visit: Payer: Self-pay

## 2022-04-08 ENCOUNTER — Other Ambulatory Visit: Payer: Self-pay

## 2022-04-15 ENCOUNTER — Other Ambulatory Visit: Payer: Self-pay

## 2022-04-27 ENCOUNTER — Other Ambulatory Visit: Payer: Self-pay

## 2022-04-29 ENCOUNTER — Other Ambulatory Visit (HOSPITAL_COMMUNITY): Payer: Self-pay

## 2022-05-03 ENCOUNTER — Other Ambulatory Visit: Payer: Self-pay

## 2022-05-03 ENCOUNTER — Other Ambulatory Visit (HOSPITAL_COMMUNITY): Payer: Self-pay

## 2022-05-03 MED ORDER — MOUNJARO 10 MG/0.5ML ~~LOC~~ SOAJ
10.0000 mg | SUBCUTANEOUS | 2 refills | Status: DC
  Filled 2022-05-03 – 2022-08-19 (×4): qty 2, 28d supply, fill #0
  Filled 2022-09-21: qty 2, 28d supply, fill #1
  Filled 2022-10-13 – 2022-10-14 (×2): qty 2, 28d supply, fill #2

## 2022-05-26 ENCOUNTER — Other Ambulatory Visit: Payer: Self-pay

## 2022-05-26 ENCOUNTER — Other Ambulatory Visit (HOSPITAL_COMMUNITY): Payer: Self-pay

## 2022-05-28 ENCOUNTER — Other Ambulatory Visit (HOSPITAL_COMMUNITY): Payer: Self-pay

## 2022-05-31 ENCOUNTER — Other Ambulatory Visit (HOSPITAL_COMMUNITY): Payer: Self-pay

## 2022-05-31 ENCOUNTER — Other Ambulatory Visit: Payer: Self-pay

## 2022-06-01 ENCOUNTER — Other Ambulatory Visit (HOSPITAL_COMMUNITY): Payer: Self-pay

## 2022-06-01 ENCOUNTER — Other Ambulatory Visit: Payer: Self-pay

## 2022-06-01 MED ORDER — MOUNJARO 10 MG/0.5ML ~~LOC~~ SOAJ
10.0000 mg | SUBCUTANEOUS | 2 refills | Status: DC
  Filled 2022-06-01 – 2022-06-02 (×2): qty 2, 28d supply, fill #0
  Filled 2022-06-24 – 2022-06-28 (×2): qty 2, 28d supply, fill #1
  Filled 2022-07-22: qty 2, 28d supply, fill #2

## 2022-06-01 MED ORDER — MOUNJARO 7.5 MG/0.5ML ~~LOC~~ SOAJ
7.5000 mg | SUBCUTANEOUS | 2 refills | Status: DC
  Filled 2022-06-01: qty 2, 28d supply, fill #0

## 2022-06-02 ENCOUNTER — Other Ambulatory Visit (HOSPITAL_COMMUNITY): Payer: Self-pay

## 2022-06-24 ENCOUNTER — Other Ambulatory Visit (HOSPITAL_COMMUNITY): Payer: Self-pay

## 2022-06-28 ENCOUNTER — Other Ambulatory Visit (HOSPITAL_COMMUNITY): Payer: Self-pay

## 2022-07-22 ENCOUNTER — Other Ambulatory Visit (HOSPITAL_COMMUNITY): Payer: Self-pay

## 2022-07-23 ENCOUNTER — Other Ambulatory Visit (HOSPITAL_COMMUNITY): Payer: Self-pay

## 2022-07-27 ENCOUNTER — Other Ambulatory Visit: Payer: Self-pay

## 2022-07-27 MED ORDER — DAPAGLIFLOZIN PROPANEDIOL 10 MG PO TABS
10.0000 mg | ORAL_TABLET | Freq: Every day | ORAL | 3 refills | Status: DC
  Filled 2022-07-27 (×2): qty 90, 90d supply, fill #0
  Filled 2022-10-28: qty 30, 30d supply, fill #1

## 2022-08-19 ENCOUNTER — Other Ambulatory Visit (HOSPITAL_COMMUNITY): Payer: Self-pay

## 2022-08-19 ENCOUNTER — Other Ambulatory Visit: Payer: Self-pay

## 2022-09-21 ENCOUNTER — Other Ambulatory Visit: Payer: Self-pay

## 2022-09-28 ENCOUNTER — Other Ambulatory Visit: Payer: Self-pay

## 2022-10-12 ENCOUNTER — Other Ambulatory Visit: Payer: Self-pay | Admitting: Adult Health

## 2022-10-12 DIAGNOSIS — Z9889 Other specified postprocedural states: Secondary | ICD-10-CM

## 2022-10-13 ENCOUNTER — Other Ambulatory Visit: Payer: Self-pay

## 2022-10-14 ENCOUNTER — Other Ambulatory Visit: Payer: Self-pay

## 2022-10-15 ENCOUNTER — Other Ambulatory Visit: Payer: Self-pay

## 2022-10-19 ENCOUNTER — Telehealth: Payer: Self-pay | Admitting: Hematology and Oncology

## 2022-10-19 NOTE — Telephone Encounter (Signed)
10/19/22 Due to a change in provider schedule, I called patient and left a voice mail to reschedule appointment. I mailed a reminder notice with new appointment details

## 2022-10-22 ENCOUNTER — Ambulatory Visit: Payer: 59 | Admitting: Obstetrics & Gynecology

## 2022-10-25 ENCOUNTER — Ambulatory Visit: Payer: Commercial Managed Care - PPO | Admitting: Obstetrics and Gynecology

## 2022-10-26 ENCOUNTER — Ambulatory Visit: Payer: Commercial Managed Care - PPO | Admitting: Obstetrics and Gynecology

## 2022-10-27 ENCOUNTER — Ambulatory Visit: Payer: 59 | Admitting: Hematology and Oncology

## 2022-10-28 ENCOUNTER — Other Ambulatory Visit (HOSPITAL_COMMUNITY)
Admission: RE | Admit: 2022-10-28 | Discharge: 2022-10-28 | Disposition: A | Discharge status: Home / Self Care | Payer: Commercial Managed Care - PPO | Source: Ambulatory Visit | Attending: Obstetrics and Gynecology | Admitting: Obstetrics and Gynecology

## 2022-10-28 ENCOUNTER — Other Ambulatory Visit: Payer: Self-pay

## 2022-10-28 ENCOUNTER — Ambulatory Visit: Payer: Commercial Managed Care - PPO | Admitting: Obstetrics and Gynecology

## 2022-10-28 ENCOUNTER — Encounter: Payer: Self-pay | Admitting: Obstetrics and Gynecology

## 2022-10-28 VITALS — BP 126/60 | HR 78 | Ht 63.0 in | Wt 188.4 lb

## 2022-10-28 DIAGNOSIS — E66811 Obesity, class 1: Secondary | ICD-10-CM

## 2022-10-28 DIAGNOSIS — Z780 Asymptomatic menopausal state: Secondary | ICD-10-CM | POA: Diagnosis not present

## 2022-10-28 DIAGNOSIS — M8580 Other specified disorders of bone density and structure, unspecified site: Secondary | ICD-10-CM | POA: Diagnosis not present

## 2022-10-28 DIAGNOSIS — B372 Candidiasis of skin and nail: Secondary | ICD-10-CM | POA: Diagnosis not present

## 2022-10-28 DIAGNOSIS — Z170 Estrogen receptor positive status [ER+]: Secondary | ICD-10-CM | POA: Diagnosis not present

## 2022-10-28 DIAGNOSIS — N890 Mild vaginal dysplasia: Secondary | ICD-10-CM | POA: Insufficient documentation

## 2022-10-28 DIAGNOSIS — Z01419 Encounter for gynecological examination (general) (routine) without abnormal findings: Secondary | ICD-10-CM | POA: Insufficient documentation

## 2022-10-28 DIAGNOSIS — C50412 Malignant neoplasm of upper-outer quadrant of left female breast: Secondary | ICD-10-CM | POA: Diagnosis not present

## 2022-10-28 MED ORDER — MICONAZOLE NITRATE 2 % EX AERP
INHALATION_SPRAY | CUTANEOUS | 1 refills | Status: AC
  Filled 2022-10-28: qty 128, fill #0

## 2022-10-28 NOTE — Addendum Note (Signed)
Addended by: Darrell Jewel V on: 10/28/2022 04:43 PM   Modules accepted: Orders

## 2022-10-28 NOTE — Assessment & Plan Note (Signed)
Cervical cancer screening performed according to ASCCP guidelines. Encouraged annual mammogram screening Colonoscopy UTD DXA due next year Labs and immunizations with her primary Encouraged safe sexual practices as indicated Encouraged healthy lifestyle practices with diet and exercise

## 2022-10-28 NOTE — Assessment & Plan Note (Signed)
Encouraged healthy diet and exercise

## 2022-10-28 NOTE — Assessment & Plan Note (Signed)
-  Normal breast exam

## 2022-10-28 NOTE — Patient Instructions (Addendum)
Recommend 1200mg  calcium daily and 600IU of vitamin D daily.  Health Maintenance, Female Adopting a healthy lifestyle and getting preventive care are important in promoting health and wellness. Ask your health care provider about: The right schedule for you to have regular tests and exams. Things you can do on your own to prevent diseases and keep yourself healthy. What should I know about diet, weight, and exercise? Eat a healthy diet  Eat a diet that includes plenty of vegetables, fruits, low-fat dairy products, and lean protein. Do not eat a lot of foods that are high in solid fats, added sugars, or sodium. Maintain a healthy weight Body mass index (BMI) is used to identify weight problems. It estimates body fat based on height and weight. Your health care provider can help determine your BMI and help you achieve or maintain a healthy weight. Get regular exercise Get regular exercise. This is one of the most important things you can do for your health. Most adults should: Exercise for at least 150 minutes each week. The exercise should increase your heart rate and make you sweat (moderate-intensity exercise). Do strengthening exercises at least twice a week. This is in addition to the moderate-intensity exercise. Spend less time sitting. Even light physical activity can be beneficial. Watch cholesterol and blood lipids Have your blood tested for lipids and cholesterol at 68 years of age, then have this test every 5 years. Have your cholesterol levels checked more often if: Your lipid or cholesterol levels are high. You are older than 68 years of age. You are at high risk for heart disease. What should I know about cancer screening? Depending on your health history and family history, you may need to have cancer screening at various ages. This may include screening for: Breast cancer. Cervical cancer. Colorectal cancer. Skin cancer. Lung cancer. What should I know about heart  disease, diabetes, and high blood pressure? Blood pressure and heart disease High blood pressure causes heart disease and increases the risk of stroke. This is more likely to develop in people who have high blood pressure readings or are overweight. Have your blood pressure checked: Every 3-5 years if you are 37-44 years of age. Every year if you are 72 years old or older. Diabetes Have regular diabetes screenings. This checks your fasting blood sugar level. Have the screening done: Once every three years after age 61 if you are at a normal weight and have a low risk for diabetes. More often and at a younger age if you are overweight or have a high risk for diabetes. What should I know about preventing infection? Hepatitis B If you have a higher risk for hepatitis B, you should be screened for this virus. Talk with your health care provider to find out if you are at risk for hepatitis B infection. Hepatitis C Testing is recommended for: Everyone born from 1 through 1966. Anyone with known risk factors for hepatitis C. Sexually transmitted infections (STIs) Get screened for STIs, including gonorrhea and chlamydia, if: You are sexually active and are younger than 68 years of age. You are older than 68 years of age and your health care provider tells you that you are at risk for this type of infection. Your sexual activity has changed since you were last screened, and you are at increased risk for chlamydia or gonorrhea. Ask your health care provider if you are at risk. Ask your health care provider about whether you are at high risk for HIV. Your health  care provider may recommend a prescription medicine to help prevent HIV infection. If you choose to take medicine to prevent HIV, you should first get tested for HIV. You should then be tested every 3 months for as long as you are taking the medicine. Osteoporosis and menopause Osteoporosis is a disease in which the bones lose minerals and  strength with aging. This can result in bone fractures. If you are 64 years old or older, or if you are at risk for osteoporosis and fractures, ask your health care provider if you should: Be screened for bone loss. Take a calcium or vitamin D supplement to lower your risk of fractures. Be given hormone replacement therapy (HRT) to treat symptoms of menopause. Follow these instructions at home: Alcohol use Do not drink alcohol if: Your health care provider tells you not to drink. You are pregnant, may be pregnant, or are planning to become pregnant. If you drink alcohol: Limit how much you have to: 0-1 drink a day. Know how much alcohol is in your drink. In the [place] one drink equals one 12 oz bottle of beer (355 mL), one 5 oz glass of wine (148 mL), or one 1 oz glass of hard liquor (44 mL). Lifestyle Do not use any products that contain nicotine or tobacco. These products include cigarettes, chewing tobacco, and vaping devices, such as e-cigarettes. If you need help quitting, ask your health care provider. Do not use street drugs. Do not share needles. Ask your health care provider for help if you need support or information about quitting drugs. General instructions Schedule regular health, dental, and eye exams. Stay current with your vaccines. Tell your health care provider if: You often feel depressed. You have ever been abused or do not feel safe at home. Summary Adopting a healthy lifestyle and getting preventive care are important in promoting health and wellness. Follow your health care provider's instructions about healthy diet, exercising, and getting tested or screened for diseases. Follow your health care provider's instructions on monitoring your cholesterol and blood pressure. This information is not intended to replace advice given to you by your health care provider. Make sure you discuss any questions you have with your health care provider. Document Revised:  05/19/2020 Document Reviewed: 05/19/2020 Elsevier Patient Education  2024 [employer].

## 2022-10-28 NOTE — Assessment & Plan Note (Signed)
Continue vitamin D+Calcium Encouraged weight based exercise DXA due 2024

## 2022-10-28 NOTE — Progress Notes (Addendum)
67 y.o. [id number] postmenopausal female s/p  LAVH in 2004 for leiomyoma, vaginal dysplasia (VAIN 1, dx 2014 by Dr. Katherina Right, no biopsies since 2/2 normal colposcopy,  HPV pos 2022, HPV neg 2023), h/o left breast cancer (dx 2019, s/p  left partial mastectomy, LDN, RT), osteopenia (dx 2023) here for annual exam. 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 No complaints.  Postmenopausal bleeding: none Pelvic discharge or pain: none Breast mass, nipple discharge or skin changes : none Last PAP:     Component Value Date/Time   DIAGPAP (A) 10/19/2021 1528    - Atypical squamous cells of undetermined significance (ASC-US)   DIAGPAP  10/03/2020 1431    - Negative for intraepithelial lesion or malignancy (NILM)   HPVHIGH Negative 10/19/2021 1528   ADEQPAP Satisfactory for evaluation. 10/19/2021 1528   ADEQPAP Satisfactory for evaluation. 10/03/2020 1431   Last mammogram: 11/23/21 Bi-rads 2 benign  Last colonoscopy: 2024, q5y Last DXA: 10/27/21 osteopenia  Sexually active: yes, in a relationship  Exercising: none, desk job. Has lost weight due to dietary changes.  GYN HISTORY: LAVH in 2004 for leiomyoma Vaginal dysplasia (VAIN 1, dx 2014 by Dr. Katherina Right, no biopsies since 2/2 normal colposcopy, HPV pos 2022, HPV neg 2023) Left breast cancer (dx 2019, s/p  left partial mastectomy, LDN, RT) Osteopenia (dx 2023)  OB History  Gravida Para Term Preterm AB Living  3 2 2   1 2   SAB IAB Ectopic Multiple Live Births  1            # Outcome Date GA Lbr Len/2nd Weight Sex Type Anes PTL Lv  3 SAB           2 Term           1 Term             Past Medical History:  Diagnosis Date   Carcinoma of upper-outer quadrant of left breast in female, estrogen receptor positive [facility]) oncologist--- dr Pamelia Hoit   dx 09/ 2019,  [facility], Stage IA, ER/PR+,  s/p  left partial mastectomy with node dissection 10-11-2017,  completed radiation 01-13-2018   DDD (degenerative disc disease), lumbar    Facial trauma     forehead,nose and upper lip secondary to fall    History of adenomatous polyp of colon    History of diverticulitis of colon    History of hyperparathyroidism    s/p  parathyroidectomy tumor resection 02/ 2017   History of kidney stones    History of vaginal dysplasia 2014; 2019   VAIN 1   Insulin dependent type 2 diabetes mellitus [facility])    endocrinologist-- dr Sharl Ma   Kidney stone    Multinodular goiter (nontoxic)    followed by dr Sharl Ma (endocrinologist)   Personal history of radiation therapy 11-29-2017   to 01-13-2018   Right Breast Cancer   Plantar fasciitis    PONV (postoperative nausea and vomiting)    emotional when awakens / cries   Renal calculus, right    Seasonal allergies    Vitamin D deficiency     Past Surgical History:  Procedure Laterality Date   ABDOMINAL HYSTERECTOMY     CARPAL TUNNEL RELEASE Bilateral right 07-16-2003;  left 08-12-2003  @[facility]    CESAREAN SECTION  1989   CYSTOSCOPY WITH RETROGRADE PYELOGRAM, URETEROSCOPY AND STENT PLACEMENT Right 05/27/2020   Procedure: CYSTOSCOPY WITH RETROGRADE PYELOGRAM, URETEROSCOPY,  STENT PLACEMENT;  Surgeon: Noel Christmas, MD;  Location: [facility] [address] [place];  Service: Urology;  Laterality: Right;   HOLMIUM LASER APPLICATION Right 05/27/2020   Procedure: HOLMIUM LASER APPLICATION;  Surgeon: Noel Christmas, MD;  Location: [facility];  Service: Urology;  Laterality: Right;   HYSTEROSCOPY WITH RESECTOSCOPE  08-24-2001  @[facility]    LAPAROSCOPIC ASSISTED VAGINAL HYSTERECTOMY  05-16-2002  @[facility]    LAPAROSCOPIC CHOLECYSTECTOMY  [phone]  @[facility]    PARATHYROIDECTOMY N/A 02/21/2015   Procedure: RIGHT PARATHYROIDECTOMY;  Surgeon: Darnell Level, MD;  Location: [facility];  Service: General;  Laterality: N/A;   PARTIAL MASTECTOMY WITH AXILLARY SENTINEL LYMPH NODE BIOPSY Left 10/11/2017   Procedure: LEFT BREAST PARTIAL MASTECTOMY WITH AXILLARY SENTINEL LYMPH NODE BIOPSY;  Surgeon: Darnell Level, MD;  Location: [facility];  Service: General;  Laterality: Left;   RIGHT COLECTOMY  07-01-2009  @[facility]    ascending broad-based tubulovillous polyp  (included appendix)   TUBAL LIGATION      Current Outpatient Medications on File Prior to Visit  Medication Sig Dispense Refill   ACCU-CHEK FASTCLIX LANCETS MISC USE 3 TIMES A DAY AS DIRECTED  3   ACCU-CHEK GUIDE test strip USE 3 TIMES A DAY AS DIRECTED  3   aspirin 81 MG EC tablet 1 tablet     cetirizine (ZYRTEC) 10 MG tablet Take 10 mg by mouth daily.     dapagliflozin propanediol (FARXIGA) 10 MG TABS tablet Take 1 tablet (10 mg total) by mouth daily. 90 tablet 0   dapagliflozin propanediol (FARXIGA) 10 MG TABS tablet Take 1 tablet (10 mg total) by mouth daily. 30 tablet 3   glucose blood test strip Use as directed for fasting and 2 hours post prandial blood glucose 200 each 11   tirzepatide (MOUNJARO) 10 MG/0.5ML Pen Inject 10 mg into the skin once a week. 2 mL 2   tirzepatide (MOUNJARO) 10 MG/0.5ML Pen Inject 10 mg into the skin once a week as directed. 2 mL 2   UNIFINE PENTIPS 31G X 5 MM MISC USE TWICE A DAY AS DIRECTED  2   letrozole (FEMARA) 2.5 MG tablet Take 1 tablet (2.5 mg total) by mouth daily. (Patient not taking: Reported on 10/28/2022) 90 tablet 3   omeprazole (PRILOSEC) 20 MG capsule Take 1 capsule (20 mg total) by mouth daily before breakfast. (Patient not taking: Reported on 10/28/2022) 90 capsule 0   No current facility-administered medications on file prior to visit.    Social History   Socioeconomic History   Marital status: Divorced    Spouse name: Not on file   Number of children: 2   Years of education: Not on file   Highest education level: Not on file  Occupational History   Not on file  Tobacco Use   Smoking status: Never   Smokeless tobacco: Never  Vaping Use   Vaping status: Never Used  Substance and Sexual Activity   Alcohol use: No    Alcohol/week: 0.0 standard drinks of alcohol   Drug use: Never   Sexual  activity: Yes    Birth control/protection: Surgical    Comment: 1st intercourse 33 yo-5 partners, hysterectomy  Other Topics Concern   Not on file  Social History Narrative   Daughter and her 5 children live with pt    Social Determinants of Health   Financial Resource Strain: Not on file  Food Insecurity: Not on file  Transportation Needs: No Transportation Needs (02/27/2018)   PRAPARE - Administrator, Civil Service (Medical): No    Lack of Transportation (Non-Medical): No  Physical  Activity: Not on file  Stress: Not on file  Social Connections: Not on file  Intimate Partner Violence: Not At Risk (02/27/2018)   Humiliation, Afraid, Rape, and Kick questionnaire    Fear of Current or Ex-Partner: No    Emotionally Abused: No    Physically Abused: No    Sexually Abused: No    Family History  Problem Relation Age of Onset   Heart attack Father    Diabetes Brother    Cancer Brother        Lung cancer   Hyperlipidemia Mother    Hypertension Mother     Allergies  Allergen Reactions   Canagliflozin     Other reaction(s): yeast infection   Empagliflozin     Other reaction(s): upset stomach   Metformin Hcl     Other reaction(s): diarrhea   Parlodel [Bromocriptine Mesylate] Hives      PE Today's Vitals   10/28/22 0951  BP: 126/60  Pulse: 78  SpO2: 100%  Weight: 188 lb 6.4 oz (85.5 kg)  Height: 5\' 3"  (1.6 m)   Body mass index is 33.37 kg/m.  Physical Exam Vitals reviewed. Exam conducted with a chaperone present.  Constitutional:      General: She is not in acute distress.    Appearance: Normal appearance.  HENT:     Head: Normocephalic and atraumatic.     Nose: Nose normal.  Eyes:     Extraocular Movements: Extraocular movements intact.     Conjunctiva/sclera: Conjunctivae normal.  Neck:     Thyroid: No thyroid mass, thyromegaly or thyroid tenderness.  Pulmonary:     Effort: Pulmonary effort is normal.  Chest:     Chest wall: No mass or  tenderness.  Breasts:    Right: Normal. No swelling, mass, nipple discharge or tenderness.     Left: Normal. No swelling, mass, nipple discharge or tenderness.       Comments: Partial masectonmy scar Abdominal:     General: There is no distension.     Palpations: Abdomen is soft.     Tenderness: There is no abdominal tenderness.  Genitourinary:    General: Normal vulva.     Exam position: Lithotomy position.     Urethra: No prolapse.     Vagina: Normal. No vaginal discharge or bleeding.     Cervix: No lesion.     Adnexa: Right adnexa normal and left adnexa normal.     Comments: Cervix and uterus absent Musculoskeletal:        General: Normal range of motion.     Cervical back: Normal range of motion.  Lymphadenopathy:     Upper Body:     Right upper body: No axillary adenopathy.     Left upper body: No axillary adenopathy.     Lower Body: No right inguinal adenopathy. No left inguinal adenopathy.  Skin:    General: Skin is warm and dry.  Neurological:     General: No focal deficit present.     Mental Status: She is alert.  Psychiatric:        Mood and Affect: Mood normal.        Behavior: Behavior normal.       Assessment and Plan:        Well woman exam with routine gynecological exam Assessment & Plan: Cervical cancer screening performed according to ASCCP guidelines. Encouraged annual mammogram screening Colonoscopy UTD DXA due next year Labs and immunizations with her primary Encouraged safe sexual practices as indicated Encouraged  healthy lifestyle practices with diet and exercise    VAIN I (vaginal intraepithelial neoplasia grade I) -     Cytology - PAP  Osteopenia after menopause Assessment & Plan: Continue vitamin D+Calcium Encouraged weight based exercise DXA due 2024    Carcinoma of upper-outer quadrant of left breast in female, estrogen receptor positive (HCC) Assessment & Plan: Normal breast exam   Class 1 obesity due to excess calories  with serious comorbidity and body mass index (BMI) of 33.0 to 33.9 in adult Assessment & Plan: Encouraged healthy diet and exercise.    Candidal intertrigo -     Miconazole Nitrate; Apply to affected areas twice a day for two weeks then as needed.  Dispense: 128 g; Refill: 1    Rosalyn Gess, MD

## 2022-11-02 LAB — CYTOLOGY - PAP
Comment: NEGATIVE
Comment: NEGATIVE
Comment: NEGATIVE
HPV 16: NEGATIVE
HPV 18 / 45: POSITIVE — AB
High risk HPV: POSITIVE — AB

## 2022-11-03 ENCOUNTER — Other Ambulatory Visit: Payer: Self-pay

## 2022-11-03 DIAGNOSIS — R87612 Low grade squamous intraepithelial lesion on cytologic smear of cervix (LGSIL): Secondary | ICD-10-CM

## 2022-11-10 ENCOUNTER — Other Ambulatory Visit: Payer: Self-pay

## 2022-11-10 MED ORDER — MOUNJARO 10 MG/0.5ML ~~LOC~~ SOAJ
10.0000 mg | SUBCUTANEOUS | 2 refills | Status: DC
  Filled 2022-11-10: qty 2, 28d supply, fill #0
  Filled 2022-12-07: qty 2, 28d supply, fill #1
  Filled 2023-01-11: qty 2, 28d supply, fill #2

## 2022-11-11 ENCOUNTER — Other Ambulatory Visit: Payer: Self-pay

## 2022-11-11 DIAGNOSIS — H524 Presbyopia: Secondary | ICD-10-CM | POA: Diagnosis not present

## 2022-11-23 ENCOUNTER — Other Ambulatory Visit (HOSPITAL_COMMUNITY)
Admission: RE | Admit: 2022-11-23 | Discharge: 2022-11-23 | Disposition: A | Discharge status: Home / Self Care | Payer: Commercial Managed Care - PPO | Source: Ambulatory Visit | Attending: Obstetrics and Gynecology | Admitting: Obstetrics and Gynecology

## 2022-11-23 ENCOUNTER — Other Ambulatory Visit: Payer: Self-pay

## 2022-11-23 ENCOUNTER — Ambulatory Visit (INDEPENDENT_AMBULATORY_CARE_PROVIDER_SITE_OTHER): Payer: Commercial Managed Care - PPO | Admitting: Obstetrics and Gynecology

## 2022-11-23 ENCOUNTER — Encounter: Payer: Self-pay | Admitting: Obstetrics and Gynecology

## 2022-11-23 ENCOUNTER — Inpatient Hospital Stay: Payer: Commercial Managed Care - PPO | Attending: Hematology and Oncology | Admitting: Hematology and Oncology

## 2022-11-23 VITALS — BP 134/59 | HR 60 | Temp 97.8°F | Resp 18 | Ht 63.0 in | Wt 186.2 lb

## 2022-11-23 VITALS — BP 134/59 | HR 43 | Wt 185.0 lb

## 2022-11-23 DIAGNOSIS — N890 Mild vaginal dysplasia: Secondary | ICD-10-CM

## 2022-11-23 DIAGNOSIS — B372 Candidiasis of skin and nail: Secondary | ICD-10-CM | POA: Diagnosis not present

## 2022-11-23 DIAGNOSIS — C50412 Malignant neoplasm of upper-outer quadrant of left female breast: Secondary | ICD-10-CM | POA: Insufficient documentation

## 2022-11-23 DIAGNOSIS — Z79811 Long term (current) use of aromatase inhibitors: Secondary | ICD-10-CM | POA: Insufficient documentation

## 2022-11-23 DIAGNOSIS — R87612 Low grade squamous intraepithelial lesion on cytologic smear of cervix (LGSIL): Secondary | ICD-10-CM | POA: Insufficient documentation

## 2022-11-23 DIAGNOSIS — Z170 Estrogen receptor positive status [ER+]: Secondary | ICD-10-CM | POA: Diagnosis not present

## 2022-11-23 DIAGNOSIS — N898 Other specified noninflammatory disorders of vagina: Secondary | ICD-10-CM | POA: Insufficient documentation

## 2022-11-23 MED ORDER — NYSTATIN 100000 UNIT/GM EX POWD
1.0000 | Freq: Two times a day (BID) | CUTANEOUS | 1 refills | Status: AC
Start: 2022-11-23 — End: ?
  Filled 2022-11-23: qty 60, 30d supply, fill #0
  Filled 2022-12-30: qty 60, 30d supply, fill #1

## 2022-11-23 NOTE — Progress Notes (Signed)
Patient Care Team: Rebekah Chesterfield, NP as PCP - General (Internal Medicine) Darnell Level, MD as Consulting Physician (General Surgery) Serena Croissant, MD as Consulting Physician (Hematology and Oncology) Antony Blackbird, MD as Consulting Physician (Radiation Oncology) Glyn Ade, PA-C as Physician Assistant (Dermatology)  DIAGNOSIS:  Encounter Diagnosis  Name Primary?   Carcinoma of upper-outer quadrant of left breast in female, estrogen receptor positive (HCC) Yes    SUMMARY OF ONCOLOGIC HISTORY: Oncology History  Carcinoma of upper-outer quadrant of left breast in female, estrogen receptor positive (HCC)  10/09/2017 Initial Diagnosis   Carcinoma of upper-outer quadrant of left breast in female, estrogen receptor positive (HCC)   10/20/2017 Surgery   Left lumpectomy: IDC grade 2, 2.1 cm, margins Negative 0.1 cm to the anterior margin, UDH, 0/3 lymph nodes negative, ER 95%, PR 80%, HER-2 negative, Ki-67 10%, T2N0 stage IA   10/25/2017 Cancer Staging   Staging form: Breast, AJCC 8th Edition - Pathologic: Stage IA (pT2, pN0(sn), cM0, G2, ER+, PR+, HER2-) - Signed by Serena Croissant, MD on 10/25/2017   11/02/2017 Oncotype testing   Oncotype DX recurrence score 23, distant recurrence at 9 years 9%, no chemo benefit   11/29/2017 - 01/13/2018 Radiation Therapy   Adjuvant radiation therapy   01/2018 - 10/2022 Anti-estrogen oral therapy   Anastrozole daily, switched to letrozole 05/18/19     CHIEF COMPLIANT: Follow-up on letrozole therapy  HISTORY OF PRESENT ILLNESS:  History of Present Illness   The patient, a breast cancer survivor, presents for her annual follow-up, five years post-diagnosis. She reports significant weight loss of approximately forty pounds over the past year, achieved through dietary changes and increased physical activity. She has also discontinued omeprazole and aspirin. Despite these positive changes, she expresses dissatisfaction with persistent  abdominal fat.  In terms of her breast cancer treatment, she completed a five-year course of hormone therapy with letrozole two months ago. She experienced joint aches and stiffness as side effects of the medication. 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         ALLERGIES:  is allergic to canagliflozin, empagliflozin, metformin hcl, and parlodel [bromocriptine mesylate].  MEDICATIONS:  Current Outpatient Medications  Medication Sig Dispense Refill   ACCU-CHEK FASTCLIX LANCETS MISC USE 3 TIMES A DAY AS DIRECTED  3   ACCU-CHEK GUIDE test strip USE 3 TIMES A DAY AS DIRECTED  3   cetirizine (ZYRTEC) 10 MG tablet Take 10 mg by mouth daily.     dapagliflozin propanediol (FARXIGA) 10 MG TABS tablet Take 1 tablet (10 mg total) by mouth daily. 90 tablet 0   dapagliflozin propanediol (FARXIGA) 10 MG TABS tablet Take 1 tablet (10 mg total) by mouth daily. 30 tablet 3   glucose blood test strip Use as directed for fasting and 2 hours post prandial blood glucose 200 each 11   Miconazole Nitrate 2 % AERP Apply to affected areas twice a day for two weeks then as needed. 128 g 1   tirzepatide (MOUNJARO) 10 MG/0.5ML Pen Inject 10 mg into the skin once a week as directed. 2 mL 2   tirzepatide (MOUNJARO) 10 MG/0.5ML Pen Inject 10 mg into the skin once a week. 2 mL 2   UNIFINE PENTIPS 31G X 5 MM MISC USE TWICE A DAY AS DIRECTED  2   No current facility-administered medications for this visit.    PHYSICAL EXAMINATION: ECOG PERFORMANCE STATUS: 1 - Symptomatic but completely ambulatory  Vitals:   11/23/22 0810  BP: (!) 134/59  Pulse: 60  Resp: 18  Temp: 97.8 F (36.6 C)  SpO2: 98%   Filed Weights   11/23/22 0810  Weight: 186 lb 3.2 oz (84.5 kg)    Physical Exam no lumps or nodules in bilateral breasts.        (exam performed in the presence of a chaperone)  LABORATORY DATA:  I have reviewed the data as listed    Latest  Ref Rng & Units 05/27/2020    6:35 AM 02/06/2020    9:09 PM 09/12/2019    8:16 AM  CMP  Glucose 70 - 99 mg/dL 161  72  096   BUN 8 - 23 mg/dL 10  16  15    Creatinine 0.44 - 1.00 mg/dL 0.45  4.09  8.11   Sodium 135 - 145 mmol/L 140  135  139   Potassium 3.5 - 5.1 mmol/L 4.0  3.7  4.2   Chloride 98 - 111 mmol/L 104  102  101   CO2 22 - 32 mmol/L  24  26   Calcium 8.9 - 10.3 mg/dL  9.4  91.4   Total Protein 6.5 - 8.1 g/dL  7.3  7.2   Total Bilirubin 0.3 - 1.2 mg/dL  0.4  0.4   Alkaline Phos 38 - 126 U/L  96  90   AST 15 - 41 U/L  17  10   ALT 0 - 44 U/L  20  12     Lab Results  Component Value Date   WBC 8.1 02/06/2020   HGB 13.6 05/27/2020   HCT 40.0 05/27/2020   MCV 85.7 02/06/2020   PLT 212 02/06/2020   NEUTROABS 4.0 09/12/2019    ASSESSMENT & PLAN:  Carcinoma of upper-outer quadrant of left breast in female, estrogen receptor positive (HCC) 10/11/2017:Left lumpectomy: IDC grade 2, 2.1 cm, margins Negative 0.1 cm to the anterior margin, UDH, 0/3 lymph nodes negative, ER 95%, PR 80%, HER-2 negative, Ki-67 10%, T2N0 stage IA Oncotype DX recurrence score 23: Risk of distant recurrence at 9 years with hormone therapy alone 9% no chemo benefit Adjuvant radiation therapy 11/29/2017 to 01/13/2018   Treatment plan: Adjuvant antiestrogen therapy with anastrozole 1 mg daily  started 01/30/2018 switched to letrozole 05/18/2019 discontinued October 2024 (completed 5 years)    Breast cancer surveillance: 1.  Breast MRI 04/25/2019: No evidence of malignancy. 2.  Breast exam 11/23/2022: Benign 3.  Mammogram 11/23/2021: benign breast density category C   She lost 40 pounds by diet and semaglutide. 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   Return to clinic in 1 year for follow-up for long-term survivorship with Mardella Layman.   No orders of the defined types were placed in this encounter.  The patient has a good understanding of the overall plan.  she agrees with it. she will call with any problems that may develop before the next visit here. Total time spent: 30 mins including face to face time and time spent for planning, charting and co-ordination of care   Tamsen Meek, MD 11/23/22

## 2022-11-23 NOTE — Patient Instructions (Signed)
It is common to have vaginal bleeding and cramping for up to 72 hours after your biopsy. Please call our office with heavy vaginal bleeding, severe abdominal pain or fever. Avoid intercourse, tampon use, douching and baths for 7 days to decrease the risk of infection.

## 2022-11-23 NOTE — Assessment & Plan Note (Signed)
Colposcopy indicated given LSIL, HPV pos PAP of vaginal cuff Patient consented for procedure. Uncomplicated procedure completed. Reviewed postprocedure care instructions.

## 2022-11-23 NOTE — Progress Notes (Signed)
67 y.o. [id number] postmenopausal female s/p  LAVH in 2004 for leiomyoma, vaginal dysplasia (VAIN 1, dx 2014 by Dr. Katherina Right, no biopsies since 2/2 normal colposcopy,  HPV pos 2022, HPV neg 2023), h/o left breast cancer (dx 2019, s/p  left partial mastectomy, LDN, RT), osteopenia (dx 2023) here for colposcopy. 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  Reports worsening yeast rash of pannus. Pharmacy did not have prior rx.  No LMP recorded. Patient has had a hysterectomy.    Last PAP:     Component Value Date/Time   DIAGPAP - Low grade squamous intraepithelial lesion (LSIL) (A) 10/28/2022 1052   DIAGPAP (A) 10/19/2021 1528    - Atypical squamous cells of undetermined significance (ASC-US)   DIAGPAP  10/03/2020 1431    - Negative for intraepithelial lesion or malignancy (NILM)   HPVHIGH Positive (A) 10/28/2022 1052   HPVHIGH Negative 10/19/2021 1528   ADEQPAP Satisfactory for evaluation. 10/28/2022 1052   ADEQPAP Satisfactory for evaluation. 10/19/2021 1528   ADEQPAP Satisfactory for evaluation. 10/03/2020 1431   2014: Colposcopy +area of acetowhite change left lateral upper vagina approximately 1-2 cm below apex of the cuff. Representative biopsy + VAIN 1.  GYN HISTORY: LAVH in 2004 for leiomyoma Vaginal dysplasia (VAIN 1, dx 2014 by Dr. Katherina Right, no biopsies since 2/2 normal colposcopy, HPV pos 2022, HPV neg 2023) Left breast cancer (dx 2019, s/p  left partial mastectomy, LDN, RT) Osteopenia (dx 2023)  OB History  Gravida Para Term Preterm AB Living  3 2 2   1 2   SAB IAB Ectopic Multiple Live Births  1            # Outcome Date GA Lbr Len/2nd Weight Sex Type Anes PTL Lv  3 SAB           2 Term           1 Term             Past Medical History:  Diagnosis Date   Carcinoma of upper-outer quadrant of left breast in female, estrogen receptor positive [facility]) oncologist--- dr Pamelia Hoit   dx 09/ 2019,  [facility], Stage IA, ER/PR+,  s/p  left partial mastectomy with node dissection  10-11-2017,  completed radiation 01-13-2018   DDD (degenerative disc disease), lumbar    Facial trauma    forehead,nose and upper lip secondary to fall    History of adenomatous polyp of colon    History of diverticulitis of colon    History of hyperparathyroidism    s/p  parathyroidectomy tumor resection 02/ 2017   History of kidney stones    History of vaginal dysplasia 2014; 2019   VAIN 1   Insulin dependent type 2 diabetes mellitus [facility])    endocrinologist-- dr Sharl Ma   Kidney stone    Multinodular goiter (nontoxic)    followed by dr Sharl Ma (endocrinologist)   Personal history of radiation therapy 11-29-2017   to 01-13-2018   Right Breast Cancer   Plantar fasciitis    PONV (postoperative nausea and vomiting)    emotional when awakens / cries   Renal calculus, right    Seasonal allergies    Vitamin D deficiency     Past Surgical History:  Procedure Laterality Date   ABDOMINAL HYSTERECTOMY     CARPAL TUNNEL RELEASE Bilateral right 07-16-2003;  left 08-12-2003  @[facility]    CESAREAN SECTION  1989   CYSTOSCOPY WITH RETROGRADE PYELOGRAM, URETEROSCOPY AND STENT PLACEMENT Right 05/27/2020   Procedure: CYSTOSCOPY WITH RETROGRADE  PYELOGRAM, URETEROSCOPY,  STENT PLACEMENT;  Surgeon: Noel Christmas, MD;  Location[facility];  Service: Urology;  Laterality: Right;   HOLMIUM LASER APPLICATION Right 05/27/2020   Procedure: HOLMIUM LASER APPLICATION;  Surgeon: Noel Christmas, MD;  Location: [facility];  Service: Urology;  Laterality: Right;   HYSTEROSCOPY WITH RESECTOSCOPE  08-24-2001  @[facility]    LAPAROSCOPIC ASSISTED VAGINAL HYSTERECTOMY  05-16-2002  @[facility]    LAPAROSCOPIC CHOLECYSTECTOMY  [phone]  @[facility]    PARATHYROIDECTOMY N/A 02/21/2015   Procedure: RIGHT PARATHYROIDECTOMY;  Surgeon: Darnell Level, MD;  Location: [facility];  Service: General;  Laterality: N/A;   PARTIAL MASTECTOMY WITH AXILLARY SENTINEL LYMPH NODE BIOPSY Left 10/11/2017   Procedure: LEFT BREAST  PARTIAL MASTECTOMY WITH AXILLARY SENTINEL LYMPH NODE BIOPSY;  Surgeon: Darnell Level, MD;  Location: [facility];  Service: General;  Laterality: Left;   RIGHT COLECTOMY  07-01-2009  @[facility]    ascending broad-based tubulovillous polyp  (included appendix)   TUBAL LIGATION      Current Outpatient Medications on File Prior to Visit  Medication Sig Dispense Refill   ACCU-CHEK FASTCLIX LANCETS MISC USE 3 TIMES A DAY AS DIRECTED  3   ACCU-CHEK GUIDE test strip USE 3 TIMES A DAY AS DIRECTED  3   cetirizine (ZYRTEC) 10 MG tablet Take 10 mg by mouth daily.     dapagliflozin propanediol (FARXIGA) 10 MG TABS tablet Take 1 tablet (10 mg total) by mouth daily. 90 tablet 0   glucose blood test strip Use as directed for fasting and 2 hours post prandial blood glucose 200 each 11   Miconazole Nitrate 2 % AERP Apply to affected areas twice a day for two weeks then as needed. 128 g 1   tirzepatide (MOUNJARO) 10 MG/0.5ML Pen Inject 10 mg into the skin once a week. 2 mL 2   UNIFINE PENTIPS 31G X 5 MM MISC USE TWICE A DAY AS DIRECTED  2   No current facility-administered medications on file prior to visit.    Allergies  Allergen Reactions   Canagliflozin     Other reaction(s): yeast infection   Empagliflozin     Other reaction(s): upset stomach   Metformin Hcl     Other reaction(s): diarrhea   Parlodel [Bromocriptine Mesylate] Hives    PE Today's Vitals   11/23/22 0928  BP: (!) 134/59  Pulse: (!) 43  SpO2: 100%  Weight: 185 lb (83.9 kg)   Body mass index is 32.77 kg/m.  Physical Exam Vitals reviewed. Exam conducted with a chaperone present.  Constitutional:      General: She is not in acute distress.    Appearance: Normal appearance.  HENT:     Head: Normocephalic and atraumatic.     Nose: Nose normal.  Eyes:     Extraocular Movements: Extraocular movements intact.     Conjunctiva/sclera: Conjunctivae normal.  Pulmonary:     Effort: Pulmonary effort is normal.   Genitourinary:    General: Normal vulva.     Exam position: Lithotomy position.     Vagina: Normal. No vaginal discharge.     Uterus: Absent.      Adnexa: Right adnexa normal and left adnexa normal.     Comments: Uterus and cervix absent. Musculoskeletal:        General: Normal range of motion.     Cervical back: Normal range of motion.  Neurological:     General: No focal deficit present.     Mental Status: She is alert.  Psychiatric:        Mood and Affect: Mood normal.        Behavior: Behavior normal.     Colposcopy Procedure Consented for procedure.  Speculum placed in vagina.  Acetic acid 3% was applied to vaginal mucosa  Satisfactory colposcopy. Hurricane spray applied. Biopsies taken Yes.   +AW change vs scar. No abnormality vascularity or lesions. Location(s) - upper left vaginal, ~1cm posterior to cuff at 5:00   Specimen to pathology.  Good hemostasis.  Minimal EBL. No complications.  Tolerated well.     Assessment and Plan:        Candidal intertrigo -     Nystatin; Apply 1 Application topically 2 (two) times daily.  Dispense: 60 g; Refill: 1  VAIN I (vaginal intraepithelial neoplasia grade I) Assessment & Plan: Colposcopy indicated given LSIL, HPV pos PAP of vaginal cuff Patient consented for procedure. Uncomplicated procedure completed. Reviewed postprocedure care instructions.   Orders: -     Surgical pathology  LGSIL on Pap smear of cervix -     Surgical pathology   RTO in 2 weeks if rash unimproved  Rosalyn Gess, MD

## 2022-11-23 NOTE — Assessment & Plan Note (Addendum)
10/11/2017:Left lumpectomy: IDC grade 2, 2.1 cm, margins Negative 0.1 cm to the anterior margin, UDH, 0/3 lymph nodes negative, ER 95%, PR 80%, HER-2 negative, Ki-67 10%, T2N0 stage IA Oncotype DX recurrence score 23: Risk of distant recurrence at 9 years with hormone therapy alone 9% no chemo benefit Adjuvant radiation therapy 11/29/2017 to 01/13/2018   Treatment plan: Adjuvant antiestrogen therapy with anastrozole 1 mg daily  started 01/30/2018 switched to letrozole 05/18/2019 discontinued October 2024 (completed 5 years)    Breast cancer surveillance: 1.  Breast MRI 04/25/2019: No evidence of malignancy. 2.  Breast exam 11/23/2022: Benign 3.  Mammogram 11/23/2021: benign breast density category C   She lost 40 pounds by diet and semaglutide. 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   Return to clinic in 1 year for follow-up for long-term survivorship with Mardella Layman.

## 2022-11-24 LAB — SURGICAL PATHOLOGY

## 2022-11-25 ENCOUNTER — Ambulatory Visit
Admission: RE | Admit: 2022-11-25 | Discharge: 2022-11-25 | Disposition: A | Discharge status: Home / Self Care | Payer: Commercial Managed Care - PPO | Source: Ambulatory Visit | Attending: Adult Health | Admitting: Adult Health

## 2022-11-25 DIAGNOSIS — Z9889 Other specified postprocedural states: Secondary | ICD-10-CM

## 2022-11-25 DIAGNOSIS — R928 Other abnormal and inconclusive findings on diagnostic imaging of breast: Secondary | ICD-10-CM | POA: Diagnosis not present

## 2022-11-25 DIAGNOSIS — Z853 Personal history of malignant neoplasm of breast: Secondary | ICD-10-CM | POA: Diagnosis not present

## 2022-12-07 ENCOUNTER — Other Ambulatory Visit: Payer: Self-pay

## 2022-12-07 MED ORDER — DAPAGLIFLOZIN PROPANEDIOL 10 MG PO TABS
10.0000 mg | ORAL_TABLET | Freq: Every day | ORAL | 3 refills | Status: DC
  Filled 2022-12-07: qty 30, 30d supply, fill #0
  Filled 2023-01-20: qty 30, 30d supply, fill #1
  Filled 2023-03-09: qty 30, 30d supply, fill #2
  Filled 2023-04-05: qty 30, 30d supply, fill #3

## 2022-12-14 ENCOUNTER — Other Ambulatory Visit: Payer: Self-pay

## 2022-12-30 ENCOUNTER — Other Ambulatory Visit: Payer: Self-pay

## 2022-12-31 ENCOUNTER — Other Ambulatory Visit: Payer: Self-pay

## 2023-01-06 DIAGNOSIS — E782 Mixed hyperlipidemia: Secondary | ICD-10-CM | POA: Diagnosis not present

## 2023-01-06 DIAGNOSIS — K219 Gastro-esophageal reflux disease without esophagitis: Secondary | ICD-10-CM | POA: Diagnosis not present

## 2023-01-06 DIAGNOSIS — E109 Type 1 diabetes mellitus without complications: Secondary | ICD-10-CM | POA: Diagnosis not present

## 2023-01-11 ENCOUNTER — Other Ambulatory Visit: Payer: Self-pay

## 2023-01-20 ENCOUNTER — Other Ambulatory Visit: Payer: Self-pay

## 2023-01-28 ENCOUNTER — Other Ambulatory Visit: Payer: Self-pay

## 2023-01-28 MED ORDER — ROSUVASTATIN CALCIUM 10 MG PO TABS
10.0000 mg | ORAL_TABLET | Freq: Every evening | ORAL | 0 refills | Status: DC
Start: 2023-01-28 — End: 2023-12-05
  Filled 2023-01-28: qty 90, 90d supply, fill #0

## 2023-01-31 ENCOUNTER — Other Ambulatory Visit: Payer: Self-pay

## 2023-01-31 MED ORDER — ROSUVASTATIN CALCIUM 10 MG PO TABS
10.0000 mg | ORAL_TABLET | Freq: Every day | ORAL | 0 refills | Status: DC
  Filled 2023-01-31: qty 90, 90d supply, fill #0

## 2023-02-10 ENCOUNTER — Other Ambulatory Visit: Payer: Self-pay

## 2023-02-10 MED ORDER — MOUNJARO 10 MG/0.5ML ~~LOC~~ SOAJ
10.0000 mg | SUBCUTANEOUS | 2 refills | Status: DC
  Filled 2023-02-10 (×3): qty 2, 28d supply, fill #0
  Filled 2023-03-09: qty 2, 28d supply, fill #1
  Filled 2023-04-05: qty 2, 28d supply, fill #2

## 2023-02-21 ENCOUNTER — Other Ambulatory Visit: Payer: Self-pay

## 2023-03-09 ENCOUNTER — Other Ambulatory Visit (HOSPITAL_COMMUNITY): Payer: Self-pay

## 2023-03-09 ENCOUNTER — Other Ambulatory Visit: Payer: Self-pay

## 2023-04-05 ENCOUNTER — Other Ambulatory Visit: Payer: Self-pay

## 2023-05-10 ENCOUNTER — Other Ambulatory Visit: Payer: Self-pay

## 2023-05-10 MED ORDER — MOUNJARO 10 MG/0.5ML ~~LOC~~ SOAJ
10.0000 mg | SUBCUTANEOUS | 2 refills | Status: DC
  Filled 2023-05-10: qty 2, 28d supply, fill #0
  Filled 2023-06-02 – 2023-06-07 (×3): qty 2, 28d supply, fill #1
  Filled 2023-07-06: qty 2, 28d supply, fill #2

## 2023-05-26 ENCOUNTER — Other Ambulatory Visit: Payer: Self-pay

## 2023-05-27 ENCOUNTER — Other Ambulatory Visit: Payer: Self-pay

## 2023-05-27 MED ORDER — DAPAGLIFLOZIN PROPANEDIOL 10 MG PO TABS
10.0000 mg | ORAL_TABLET | Freq: Every day | ORAL | 3 refills | Status: DC
  Filled 2023-05-27: qty 30, 30d supply, fill #0
  Filled 2023-06-20: qty 30, 30d supply, fill #1
  Filled 2023-07-29: qty 30, 30d supply, fill #2
  Filled 2023-09-05: qty 30, 30d supply, fill #3

## 2023-06-02 ENCOUNTER — Other Ambulatory Visit: Payer: Self-pay

## 2023-06-03 ENCOUNTER — Other Ambulatory Visit: Payer: Self-pay

## 2023-06-07 ENCOUNTER — Other Ambulatory Visit: Payer: Self-pay

## 2023-06-20 ENCOUNTER — Other Ambulatory Visit: Payer: Self-pay

## 2023-06-21 ENCOUNTER — Other Ambulatory Visit: Payer: Self-pay

## 2023-07-06 ENCOUNTER — Other Ambulatory Visit: Payer: Self-pay

## 2023-07-08 ENCOUNTER — Encounter (HOSPITAL_COMMUNITY): Payer: Self-pay

## 2023-07-08 ENCOUNTER — Other Ambulatory Visit (HOSPITAL_COMMUNITY): Payer: Self-pay

## 2023-07-08 DIAGNOSIS — E782 Mixed hyperlipidemia: Secondary | ICD-10-CM | POA: Diagnosis not present

## 2023-07-08 DIAGNOSIS — K219 Gastro-esophageal reflux disease without esophagitis: Secondary | ICD-10-CM | POA: Diagnosis not present

## 2023-07-08 DIAGNOSIS — E109 Type 1 diabetes mellitus without complications: Secondary | ICD-10-CM | POA: Diagnosis not present

## 2023-07-08 MED ORDER — REPATHA 140 MG/ML ~~LOC~~ SOSY
140.0000 mg | PREFILLED_SYRINGE | SUBCUTANEOUS | 3 refills | Status: DC
  Filled 2023-07-08 – 2023-07-11 (×4): qty 2, 28d supply, fill #0
  Filled 2023-07-29 – 2023-08-02 (×2): qty 2, 28d supply, fill #1

## 2023-07-08 MED ORDER — DAPAGLIFLOZIN PROPANEDIOL 10 MG PO TABS
10.0000 mg | ORAL_TABLET | Freq: Every day | ORAL | 3 refills | Status: DC
  Filled 2023-10-05: qty 30, 30d supply, fill #0
  Filled 2023-11-02: qty 30, 30d supply, fill #1
  Filled 2023-12-01: qty 30, 30d supply, fill #0
  Filled 2023-12-01: qty 30, 30d supply, fill #2

## 2023-07-08 MED ORDER — MOUNJARO 12.5 MG/0.5ML ~~LOC~~ SOAJ
12.5000 mg | SUBCUTANEOUS | 2 refills | Status: AC
  Filled 2023-07-11 – 2023-07-29 (×2): qty 6, 84d supply, fill #0
  Filled 2023-08-02: qty 2, 28d supply, fill #0
  Filled 2023-08-02: qty 6, 84d supply, fill #0
  Filled 2023-09-05: qty 2, 28d supply, fill #1
  Filled 2023-10-05: qty 2, 28d supply, fill #2
  Filled 2023-11-02: qty 2, 28d supply, fill #3
  Filled 2023-12-01: qty 2, 28d supply, fill #4
  Filled 2023-12-29: qty 2, 28d supply, fill #5
  Filled 2024-01-25: qty 2, 28d supply, fill #6

## 2023-07-11 ENCOUNTER — Other Ambulatory Visit: Payer: Self-pay

## 2023-07-14 ENCOUNTER — Other Ambulatory Visit (HOSPITAL_COMMUNITY): Payer: Self-pay

## 2023-07-29 ENCOUNTER — Other Ambulatory Visit: Payer: Self-pay

## 2023-08-02 ENCOUNTER — Other Ambulatory Visit: Payer: Self-pay

## 2023-09-05 ENCOUNTER — Other Ambulatory Visit: Payer: Self-pay

## 2023-10-05 ENCOUNTER — Other Ambulatory Visit: Payer: Self-pay

## 2023-10-06 ENCOUNTER — Other Ambulatory Visit: Payer: Self-pay

## 2023-11-02 ENCOUNTER — Ambulatory Visit: Payer: Commercial Managed Care - PPO | Admitting: Obstetrics and Gynecology

## 2023-11-02 ENCOUNTER — Other Ambulatory Visit: Payer: Self-pay

## 2023-11-24 ENCOUNTER — Inpatient Hospital Stay: Payer: Commercial Managed Care - PPO | Admitting: Adult Health

## 2023-12-01 ENCOUNTER — Other Ambulatory Visit: Payer: Self-pay

## 2023-12-02 ENCOUNTER — Other Ambulatory Visit: Payer: Self-pay

## 2023-12-05 ENCOUNTER — Encounter: Payer: Self-pay | Admitting: Obstetrics and Gynecology

## 2023-12-05 ENCOUNTER — Other Ambulatory Visit (HOSPITAL_COMMUNITY)
Admission: RE | Admit: 2023-12-05 | Discharge: 2023-12-05 | Disposition: A | Discharge status: Home / Self Care | Source: Ambulatory Visit | Attending: Obstetrics and Gynecology | Admitting: Obstetrics and Gynecology

## 2023-12-05 ENCOUNTER — Other Ambulatory Visit: Payer: Self-pay

## 2023-12-05 ENCOUNTER — Ambulatory Visit (INDEPENDENT_AMBULATORY_CARE_PROVIDER_SITE_OTHER): Payer: Commercial Managed Care - PPO | Admitting: Obstetrics and Gynecology

## 2023-12-05 VITALS — BP 118/62 | HR 41 | Temp 97.8°F | Ht 63.75 in | Wt 174.0 lb

## 2023-12-05 DIAGNOSIS — N890 Mild vaginal dysplasia: Secondary | ICD-10-CM | POA: Insufficient documentation

## 2023-12-05 DIAGNOSIS — C50412 Malignant neoplasm of upper-outer quadrant of left female breast: Secondary | ICD-10-CM | POA: Diagnosis not present

## 2023-12-05 DIAGNOSIS — M8580 Other specified disorders of bone density and structure, unspecified site: Secondary | ICD-10-CM

## 2023-12-05 DIAGNOSIS — B372 Candidiasis of skin and nail: Secondary | ICD-10-CM | POA: Diagnosis not present

## 2023-12-05 DIAGNOSIS — Z1331 Encounter for screening for depression: Secondary | ICD-10-CM | POA: Diagnosis not present

## 2023-12-05 DIAGNOSIS — Z170 Estrogen receptor positive status [ER+]: Secondary | ICD-10-CM

## 2023-12-05 DIAGNOSIS — Z01419 Encounter for gynecological examination (general) (routine) without abnormal findings: Secondary | ICD-10-CM | POA: Diagnosis not present

## 2023-12-05 DIAGNOSIS — Z780 Asymptomatic menopausal state: Secondary | ICD-10-CM | POA: Diagnosis not present

## 2023-12-05 MED ORDER — MICONAZOLE 2 % EX POWD
1.0000 | Freq: Two times a day (BID) | CUTANEOUS | 1 refills | Status: AC | PRN
  Filled 2023-12-05: qty 85, fill #0

## 2023-12-05 NOTE — Patient Instructions (Signed)
 For patients under 50-70yo, I recommend 1200mg  calcium  daily and 600IU of vitamin D daily. For patients over 70yo, I recommend 1200mg  calcium  daily and 800IU of vitamin D daily.  Health Maintenance, Female Adopting a healthy lifestyle and getting preventive care are important in promoting health and wellness. Ask your health care provider about: The right schedule for you to have regular tests and exams. Things you can do on your own to prevent diseases and keep yourself healthy. What should I know about diet, weight, and exercise? Eat a healthy diet  Eat a diet that includes plenty of vegetables, fruits, low-fat dairy products, and lean protein. Do not eat a lot of foods that are high in solid fats, added sugars, or sodium. Maintain a healthy weight Body mass index (BMI) is used to identify weight problems. It estimates body fat based on height and weight. Your health care provider can help determine your BMI and help you achieve or maintain a healthy weight. Get regular exercise Get regular exercise. This is one of the most important things you can do for your health. Most adults should: Exercise for at least 150 minutes each week. The exercise should increase your heart rate and make you sweat (moderate-intensity exercise). Do strengthening exercises at least twice a week. This is in addition to the moderate-intensity exercise. Spend less time sitting. Even light physical activity can be beneficial. Watch cholesterol and blood lipids Have your blood tested for lipids and cholesterol at 69 years of age, then have this test every 5 years. Have your cholesterol levels checked more often if: Your lipid or cholesterol levels are high. You are older than 69 years of age. You are at high risk for heart disease. What should I know about cancer screening? Depending on your health history and family history, you may need to have cancer screening at various ages. This may include screening  for: Breast cancer. Cervical cancer. Colorectal cancer. Skin cancer. Lung cancer. What should I know about heart disease, diabetes, and high blood pressure? Blood pressure and heart disease High blood pressure causes heart disease and increases the risk of stroke. This is more likely to develop in people who have high blood pressure readings or are overweight. Have your blood pressure checked: Every 3-5 years if you are 25-57 years of age. Every year if you are 24 years old or older. Diabetes Have regular diabetes screenings. This checks your fasting blood sugar level. Have the screening done: Once every three years after age 62 if you are at a normal weight and have a low risk for diabetes. More often and at a younger age if you are overweight or have a high risk for diabetes. What should I know about preventing infection? Hepatitis B If you have a higher risk for hepatitis B, you should be screened for this virus. Talk with your health care provider to find out if you are at risk for hepatitis B infection. Hepatitis C Testing is recommended for: Everyone born from [birth year] through 1965. Anyone with known risk factors for hepatitis C. Sexually transmitted infections (STIs) Get screened for STIs, including gonorrhea and chlamydia, if: You are sexually active and are younger than 69 years of age. You are older than 69 years of age and your health care provider tells you that you are at risk for this type of infection. Your sexual activity has changed since you were last screened, and you are at increased risk for chlamydia or gonorrhea. Ask your health care provider if  you are at risk. Ask your health care provider about whether you are at high risk for HIV. Your health care provider may recommend a prescription medicine to help prevent HIV infection. If you choose to take medicine to prevent HIV, you should first get tested for HIV. You should then be tested every 3 months for as long as you  are taking the medicine. Osteoporosis and menopause Osteoporosis is a disease in which the bones lose minerals and strength with aging. This can result in bone fractures. If you are 72 years old or older, or if you are at risk for osteoporosis and fractures, ask your health care provider if you should: Be screened for bone loss. Take a calcium  or vitamin D supplement to lower your risk of fractures. Be given hormone replacement therapy (HRT) to treat symptoms of menopause. Follow these instructions at home: Alcohol use Do not drink alcohol if: Your health care provider tells you not to drink. You are pregnant, may be pregnant, or are planning to become pregnant. If you drink alcohol: Limit how much you have to: 0-1 drink a day. Know how much alcohol is in your drink. In the [place] one drink equals one 12 oz bottle of beer (355 mL), one 5 oz glass of wine (148 mL), or one 1 oz glass of hard liquor (44 mL). Lifestyle Do not use any products that contain nicotine or tobacco. These products include cigarettes, chewing tobacco, and vaping devices, such as e-cigarettes. If you need help quitting, ask your health care provider. Do not use street drugs. Do not share needles. Ask your health care provider for help if you need support or information about quitting drugs. General instructions Schedule regular health, dental, and eye exams. Stay current with your vaccines. Tell your health care provider if: You often feel depressed. You have ever been abused or do not feel safe at home. Summary Adopting a healthy lifestyle and getting preventive care are important in promoting health and wellness. Follow your health care provider's instructions about healthy diet, exercising, and getting tested or screened for diseases. Follow your health care provider's instructions on monitoring your cholesterol and blood pressure. This information is not intended to replace advice given to you by your health  care provider. Make sure you discuss any questions you have with your health care provider. Document Revised: 05/19/2020 Document Reviewed: 05/19/2020 Elsevier Patient Education  2024 [employer].

## 2023-12-05 NOTE — Assessment & Plan Note (Signed)
 PAP for hx of VAIN 1 Encouraged annual mammogram screening Colonoscopy UTD DXA due Labs and immunizations with her primary Encouraged safe sexual practices as indicated Encouraged healthy lifestyle practices with diet and exercise

## 2023-12-05 NOTE — Assessment & Plan Note (Signed)
 Continue vitamin D+Calcium Encouraged weight based exercise DXA due, ordered

## 2023-12-05 NOTE — Assessment & Plan Note (Addendum)
 2014 VAIN1 2024 LSIL, HPV pos > vaginal colpo VAIN 1 Plan: 2025 cotest x2 year

## 2023-12-05 NOTE — Progress Notes (Signed)
 68 y.o. [id number] postmenopausal female s/p LAVH in 2004 for leiomyoma, vaginal dysplasia (VAIN 1, dx 2014 by Dr. Louvenia, last colpo 2024 VAIN1), h/o left breast cancer (dx 2019, s/p  left partial mastectomy, LDN, RT), osteopenia (dx 2023) here for annual exam. 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 PCP: Renato Dorothey HERO, NP  No complaints.  Postmenopausal bleeding: none Pelvic discharge or pain: none Breast mass, nipple discharge or skin changes : none Last PAP:     Component Value Date/Time   DIAGPAP - Low grade squamous intraepithelial lesion (LSIL) (A) 10/28/2022 1052   DIAGPAP (A) 10/19/2021 1528    - Atypical squamous cells of undetermined significance (ASC-US )   DIAGPAP  10/03/2020 1431    - Negative for intraepithelial lesion or malignancy (NILM)   HPVHIGH Positive (A) 10/28/2022 1052   HPVHIGH Negative 10/19/2021 1528   ADEQPAP Satisfactory for evaluation. 10/28/2022 1052   ADEQPAP Satisfactory for evaluation. 10/19/2021 1528   ADEQPAP Satisfactory for evaluation. 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 Louvenia, no biopsies since 2/2 normal colposcopy, HPV pos 2022, HPV neg 2023) Left breast cancer (dx 2019, s/p  left partial mastectomy, LDN, RT) Osteopenia (dx 2023)  OB History  Gravida Para Term Preterm AB Living  3 2 2  1 2   SAB IAB Ectopic Multiple Live Births  1    2    # Outcome Date GA Lbr Len/2nd Weight Sex Type Anes PTL Lv  3 SAB           2 Term      Vag-Spont   LIV  1 Term      CS-Unspec   LIV    Past Medical History:  Diagnosis Date   Carcinoma of upper-outer quadrant of  left breast in female, estrogen receptor positive [facility]) oncologist--- dr odean   dx 09/ 2019,  IDC, Stage IA, ER/PR+,  s/p  left partial mastectomy with node dissection 10-11-2017,  completed radiation 01-13-2018   DDD (degenerative disc disease), lumbar    Facial trauma    forehead,nose and upper lip secondary to fall    History of adenomatous polyp of colon    History of diverticulitis of colon    History of hyperparathyroidism    s/p  parathyroidectomy tumor resection 02/ 2017   History of kidney stones    History of vaginal dysplasia 2014; 2019   VAIN 1   Insulin  dependent type 2 diabetes mellitus [facility])    endocrinologist-- dr faythe   Kidney stone    Multinodular goiter (nontoxic)    followed by dr faythe (endocrinologist)   Personal history of radiation therapy 11-29-2017   to 01-13-2018   Right Breast Cancer   Plantar fasciitis    PONV (postoperative nausea and vomiting)    emotional when awakens / cries   Renal calculus, right    Seasonal allergies    Vitamin D deficiency     Past Surgical History:  Procedure Laterality Date   ABDOMINAL HYSTERECTOMY     CARPAL TUNNEL RELEASE Bilateral right 07-16-2003;  left 08-12-2003  @[facility]    CESAREAN SECTION  1989  CYSTOSCOPY WITH RETROGRADE PYELOGRAM, URETEROSCOPY AND STENT PLACEMENT Right 05/27/2020   Procedure: CYSTOSCOPY WITH RETROGRADE PYELOGRAM, URETEROSCOPY,  STENT PLACEMENT;  Surgeon: Elisabeth Valli BIRCH, MD;  Location: [facility];  Service: Urology;  Laterality: Right;   HOLMIUM LASER APPLICATION Right 05/27/2020   Procedure: HOLMIUM LASER APPLICATION;  Surgeon: Elisabeth Valli BIRCH, MD;  Location: [facility];  Service: Urology;  Laterality: Right;   HYSTEROSCOPY WITH RESECTOSCOPE  08-24-2001  @[facility]    LAPAROSCOPIC ASSISTED VAGINAL HYSTERECTOMY  05-16-2002  @[facility]    LAPAROSCOPIC CHOLECYSTECTOMY  [phone]  @[facility]    PARATHYROIDECTOMY N/A 02/21/2015   Procedure: RIGHT PARATHYROIDECTOMY;  Surgeon: Krystal Spinner, MD;  Location: [facility];  Service: General;  Laterality: N/A;   PARTIAL MASTECTOMY WITH AXILLARY SENTINEL LYMPH NODE BIOPSY Left 10/11/2017   Procedure: LEFT BREAST PARTIAL MASTECTOMY WITH AXILLARY SENTINEL LYMPH NODE BIOPSY;  Surgeon: Spinner Krystal, MD;  Location: [facility];  Service: General;  Laterality: Left;   RIGHT COLECTOMY  07-01-2009  @[facility]    ascending broad-based tubulovillous polyp  (included appendix)   TUBAL LIGATION      Current Outpatient Medications on File Prior to Visit  Medication Sig Dispense Refill   ACCU-CHEK FASTCLIX LANCETS MISC USE 3 TIMES A DAY AS DIRECTED  3   ACCU-CHEK GUIDE test strip USE 3 TIMES A DAY AS DIRECTED  3   cetirizine  (ZYRTEC ) 10 MG tablet Take 10 mg by mouth daily.     glucose blood test strip Use as directed for fasting and 2 hours post prandial blood glucose 200 each 11   Miconazole  Nitrate 2 % AERP Apply to affected areas twice a day for two weeks then as needed. 128 g 1   nystatin  (MYCOSTATIN /NYSTOP ) powder Apply 1 Application topically 2 (two) times daily. 60 g 1   tirzepatide  (MOUNJARO ) 12.5 MG/0.5ML Pen Inject 12.5 mg into the skin once a week. 6 mL 2   UNIFINE PENTIPS 31G X 5 MM MISC USE TWICE A DAY AS DIRECTED  2   Calcium  Carb-Cholecalciferol (CALCIUM  + VITAMIN D3) 600-5 MG-MCG TABS 1 tablet with a meal Orally Once a day; Duration: 30 day(s)     No current facility-administered medications on file prior to visit.    Social History   Socioeconomic History   Marital status: Divorced    Spouse name: Not on file   Number of children: 2   Years of education: Not on file   Highest education level: Not on file  Occupational History   Not on file  Tobacco Use   Smoking status: Never   Smokeless tobacco: Never  Vaping Use   Vaping status: Never Used  Substance and Sexual Activity   Alcohol use: No    Alcohol/week: 0.0 standard drinks of alcohol   Drug use: Never   Sexual activity: Yes    Birth control/protection:  Surgical    Comment: 1st intercourse 61 yo-5 partners, hysterectomy  Other Topics Concern   Not on file  Social History Narrative   Daughter and her 5 children live with pt    Social Drivers of Corporate Investment Banker Strain: Not on file  Food Insecurity: Not on file  Transportation Needs: No Transportation Needs (02/27/2018)   PRAPARE - Administrator, Civil Service (Medical): No    Lack of Transportation (Non-Medical): No  Physical Activity: Not on file  Stress: Not on file  Social Connections: Not on file  Intimate Partner Violence: Not At Risk (02/27/2018)  Humiliation, Afraid, Rape, and Kick questionnaire    Fear of Current or Ex-Partner: No    Emotionally Abused: No    Physically Abused: No    Sexually Abused: No    Family History  Problem Relation Age of Onset   Heart attack Father    Diabetes Brother    Cancer Brother        Lung cancer   Hyperlipidemia Mother    Hypertension Mother     Allergies  Allergen Reactions   Canagliflozin     Other reaction(s): yeast infection   Empagliflozin     Other reaction(s): upset stomach   Metformin  Hcl     Other reaction(s): diarrhea   Parlodel [Bromocriptine Mesylate] Hives      PE Today's Vitals   12/05/23 0813  BP: 118/62  Pulse: (!) 41  Temp: 97.8 F (36.6 C)  TempSrc: Oral  SpO2: 98%  Weight: 174 lb (78.9 kg)  Height: 5' 3.75 (1.619 m)   Body mass index is 30.1 kg/m.  Physical Exam Vitals reviewed. Exam conducted with a chaperone present.  Constitutional:      General: She is not in acute distress.    Appearance: Normal appearance.  HENT:     Head: Normocephalic and atraumatic.     Nose: Nose normal.  Eyes:     Extraocular Movements: Extraocular movements intact.     Conjunctiva/sclera: Conjunctivae normal.  Pulmonary:     Effort: Pulmonary effort is normal.  Chest:     Chest wall: No mass or tenderness.  Breasts:    Right: Normal. No swelling, mass, nipple discharge or  tenderness.     Left: Inverted nipple (since surgery) present. No swelling, mass, nipple discharge or tenderness.       Comments: Partial mastectomy scar Abdominal:     General: There is no distension.     Palpations: Abdomen is soft.     Tenderness: There is no abdominal tenderness.     Comments: Yeast like rash of pannus fold, L>R  Genitourinary:    General: Normal vulva.     Exam position: Lithotomy position.     Urethra: No prolapse.     Vagina: Normal. No vaginal discharge or bleeding.     Cervix: No lesion.     Adnexa: Right adnexa normal and left adnexa normal.     Comments: Cervix and uterus absent Musculoskeletal:        General: Normal range of motion.     Cervical back: Normal range of motion.  Lymphadenopathy:     Upper Body:     Right upper body: No axillary adenopathy.     Left upper body: No axillary adenopathy.     Lower Body: No right inguinal adenopathy. No left inguinal adenopathy.  Skin:    General: Skin is warm and dry.  Neurological:     General: No focal deficit present.     Mental Status: She is alert.  Psychiatric:        Mood and Affect: Mood normal.        Behavior: Behavior normal.       Assessment and Plan:        Well woman exam with routine gynecological exam Assessment & Plan: PAP for hx of VAIN 1 Encouraged annual mammogram screening Colonoscopy UTD DXA due Labs and immunizations with her primary Encouraged safe sexual practices as indicated Encouraged healthy lifestyle practices with diet and exercise    VAIN I (vaginal intraepithelial neoplasia grade I) Assessment &  Plan: 2014 VAIN1 2024 LSIL, HPV pos > vaginal colpo VAIN 1 Plan: 2025 cotest x2 year   Orders: -     Cytology - PAP  Osteopenia after menopause Assessment & Plan: Continue vitamin D+Calcium  Encouraged weight based exercise DXA due, ordered    Carcinoma of upper-outer quadrant of left breast in female, estrogen receptor positive (HCC) -     DG Bone  Density; Future -     MM 3D DIAGNOSTIC MAMMOGRAM BILATERAL BREAST; Future  Candidal intertrigo -     Miconazole ; Apply 1 Application topically 2 (two) times daily as needed.  Dispense: 85 g; Refill: 1  Negative depression screening   Vera LULLA Pa, MD

## 2023-12-06 ENCOUNTER — Other Ambulatory Visit: Payer: Self-pay

## 2023-12-07 LAB — CYTOLOGY - PAP
Comment: NEGATIVE
High risk HPV: NEGATIVE

## 2023-12-13 ENCOUNTER — Ambulatory Visit: Payer: Self-pay | Admitting: Obstetrics and Gynecology

## 2023-12-13 ENCOUNTER — Other Ambulatory Visit: Payer: Self-pay

## 2023-12-14 ENCOUNTER — Ambulatory Visit
Admission: RE | Admit: 2023-12-14 | Discharge: 2023-12-14 | Disposition: A | Source: Ambulatory Visit | Attending: Obstetrics and Gynecology

## 2023-12-14 DIAGNOSIS — Z1231 Encounter for screening mammogram for malignant neoplasm of breast: Secondary | ICD-10-CM | POA: Diagnosis not present

## 2023-12-14 DIAGNOSIS — C50412 Malignant neoplasm of upper-outer quadrant of left female breast: Secondary | ICD-10-CM

## 2023-12-29 ENCOUNTER — Other Ambulatory Visit: Payer: Self-pay

## 2024-01-16 ENCOUNTER — Other Ambulatory Visit: Payer: Self-pay

## 2024-01-16 MED ORDER — DAPAGLIFLOZIN PROPANEDIOL 10 MG PO TABS
10.0000 mg | ORAL_TABLET | Freq: Every day | ORAL | 1 refills | Status: AC
  Filled 2024-01-16 – 2024-01-19 (×3): qty 90, 90d supply, fill #0

## 2024-01-19 ENCOUNTER — Other Ambulatory Visit: Payer: Self-pay

## 2024-01-20 ENCOUNTER — Other Ambulatory Visit: Payer: Self-pay

## 2024-01-25 ENCOUNTER — Other Ambulatory Visit: Payer: Self-pay

## 2024-01-27 ENCOUNTER — Other Ambulatory Visit: Payer: Self-pay

## 2024-02-09 ENCOUNTER — Other Ambulatory Visit: Payer: Self-pay

## 2024-12-05 ENCOUNTER — Ambulatory Visit: Admitting: Obstetrics and Gynecology
